# Patient Record
Sex: Female | Born: 1990
Health system: Southern US, Community
[De-identification: ages and names within clinical notes are randomized; demographics above are authoritative.]

## PROBLEM LIST (undated history)

## (undated) ENCOUNTER — Inpatient Hospital Stay (HOSPITAL_COMMUNITY): Admission: AD | Payer: Self-pay

## (undated) DIAGNOSIS — E119 Type 2 diabetes mellitus without complications: Secondary | ICD-10-CM

## (undated) DIAGNOSIS — T4145XA Adverse effect of unspecified anesthetic, initial encounter: Secondary | ICD-10-CM

## (undated) DIAGNOSIS — N840 Polyp of corpus uteri: Secondary | ICD-10-CM

## (undated) DIAGNOSIS — K219 Gastro-esophageal reflux disease without esophagitis: Secondary | ICD-10-CM

## (undated) DIAGNOSIS — Z973 Presence of spectacles and contact lenses: Secondary | ICD-10-CM

## (undated) DIAGNOSIS — T8859XA Other complications of anesthesia, initial encounter: Secondary | ICD-10-CM

## (undated) DIAGNOSIS — Z8759 Personal history of other complications of pregnancy, childbirth and the puerperium: Secondary | ICD-10-CM

## (undated) DIAGNOSIS — F32A Depression, unspecified: Secondary | ICD-10-CM

## (undated) DIAGNOSIS — R7303 Prediabetes: Secondary | ICD-10-CM

## (undated) DIAGNOSIS — D509 Iron deficiency anemia, unspecified: Secondary | ICD-10-CM

## (undated) DIAGNOSIS — F419 Anxiety disorder, unspecified: Secondary | ICD-10-CM

## (undated) DIAGNOSIS — E282 Polycystic ovarian syndrome: Secondary | ICD-10-CM

## (undated) DIAGNOSIS — O24419 Gestational diabetes mellitus in pregnancy, unspecified control: Secondary | ICD-10-CM

## (undated) DIAGNOSIS — I10 Essential (primary) hypertension: Secondary | ICD-10-CM

## (undated) DIAGNOSIS — M1990 Unspecified osteoarthritis, unspecified site: Secondary | ICD-10-CM

## (undated) DIAGNOSIS — N939 Abnormal uterine and vaginal bleeding, unspecified: Secondary | ICD-10-CM

## (undated) DIAGNOSIS — F329 Major depressive disorder, single episode, unspecified: Secondary | ICD-10-CM

## (undated) DIAGNOSIS — Z8632 Personal history of gestational diabetes: Secondary | ICD-10-CM

## (undated) HISTORY — DX: Depression, unspecified: F32.A

## (undated) HISTORY — PX: NO PAST SURGERIES: SHX2092

## (undated) HISTORY — DX: Major depressive disorder, single episode, unspecified: F32.9

## (undated) HISTORY — DX: Gestational diabetes mellitus in pregnancy, unspecified control: O24.419

## (undated) HISTORY — PX: WISDOM TOOTH EXTRACTION: SHX21

## (undated) HISTORY — DX: Anxiety disorder, unspecified: F41.9

## (undated) HISTORY — DX: Prediabetes: R73.03

## (undated) HISTORY — DX: Type 2 diabetes mellitus without complications: E11.9

## (undated) HISTORY — DX: Essential (primary) hypertension: I10

---
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
## 2015-02-28 ENCOUNTER — Encounter (HOSPITAL_COMMUNITY): Payer: Self-pay

## 2015-02-28 ENCOUNTER — Ambulatory Visit
Admission: AD | Admit: 2015-02-28 | Discharge: 2015-02-28 | Disposition: A | Discharge status: Home / Self Care | Payer: Self-pay | Attending: Obstetrics & Gynecology | Admitting: Obstetrics & Gynecology

## 2015-02-28 DIAGNOSIS — Z3A22 22 weeks gestation of pregnancy: Secondary | ICD-10-CM

## 2015-02-28 DIAGNOSIS — F410 Panic disorder [episodic paroxysmal anxiety] without agoraphobia: Secondary | ICD-10-CM

## 2015-02-28 NOTE — Discharge Instructions (Signed)
Obstetrical Outpatient Discharge Instructions:    Labor & Delivery's Phone #: [phone]                           Birth Center's Phone #: [phone]    Based on the medical evaluation completed by our staff, it has been determined that you are NOT IN NEED OF EMERGENCY OBSTETRICAL SERVICES AT THIS TIME.     If and when any of the symptoms noted below occur, you are advised to go to the hospital closest to your home that provides obstetrical services or to the hospital that you and your health care provider have agreed upon.    Please follow the instructions for:    FETAL KICK COUNTS    1. Count the baby's movement every night.  2. A movement may be a kick, swish or roll. Do not count hiccups or small flutters.  3. Count baby's movements while lying down, preferably on your left side,  preferably after a meal.  4. Mark down the time you feel the baby move for the first time.  5. Mark down the time you feel the tenth fetal movement.  6. You should feel at least 10 fetal movements within one hour.  7. Call Labor and Delivery immediately if:  a. You do not feel 10 movements within one hour  b. It takes longer and longer for your baby to move 10 times  c. You have not felt your baby move all day.        PRETERM LABOR PRECAUTIONS    1. Regular uterine tightening  2. Low, dull backache.  3. Menstrual cramps  4. Pressure in your pelvis  5. Leaking or gushing fluid from your vagina.  6. Changes in vaginal discharge; watery, bloody or mucousy.        PREGNANCY INDUCED HYPERTENSION    1. Headache: sudden or severe.  2. Visual problems: blurred or double vision or "seeing spots"  3. Pain under your ribs, right over your stomach  4. Swelling of your face and hands  5. Swelling of your ankles or feet after 12-hour rest  6. Decrease in the amount of your urine  7. Rapid weight gain: 4 or 5 pounds in one week.

## 2015-02-28 NOTE — Progress Notes (Signed)
Labor and Delivery Triage Assessment    Rachel Singh is a 25 year old at 1621w6d (Estimated Date of Delivery: 5/132017), presents to triage for SOB.    Pt was at bedside of her mother who had a heart attack. Appears to witnesses that pt was having an anxiety attack. Pt with significant h/o anxiety and depression and on no meds.     She also c/o LLQ abdominal pain which was sudden in onset. However, pt states that she     Pt denies any LOF, VB, ctx, dysuria, or pre-eclampsia symptoms including headache not relieved by tylenol, vision changes or loss of vision, RUQ pain, significant increased non-dependent edema. Reports good FM, kick counts     There is no problem list on file for this patient.      Temperature:  [98.6 F (37 C)] 98.6 F (37 C) (01/13 1700)  Blood pressure (BP): (125-130)/(61-62) 125/62 (01/13 1720)  Heart Rate:  [130] 130 (01/13 1700)  Respirations:  [30] 30 (01/13 1700)  Pain Score: 7 (01/13 1700)  O2 Device: None (Room air) (01/13 1720)  SpO2:  [100 %] 100 % (01/13 1720)    Blood Pressure   02/28/15 125/62       General A&Ox3. NAD. Anxious  Abdomen - S/NT/ND/+BS  Neurology: 2+reflexes/no clonus    SSE/SVE: deferred  Fetal Heart Tones: 160's  Toco: none    Transvaginal cervical length: pt declined TVUS  Abdominal Ultrasound: fetus breech, placenta anterior with previa, AFI: 10cm      No results found for this or any previous visit.        A/P: Rachel Singh is a 25 year old G3P1 presents for anxiety exacerbation episode - resolved spontaneously    1. Anxiety d/o-  On no meds. Discussed treatment options and need to revisit with primary OB/GYN provider. Offered SW and mental health referral but pt declines and will f/u with outside providers.  2. FWB- HR 160s. US performed and c/w dates.   3. Medical release of records requested  4. Dispo: stable    [facility]     Rachel Singh Ann Mckale Haffey, MD  Signature Derived From Controlled Access Password, February 28, 2015, 6:11 PM

## 2015-02-28 NOTE — Interdisciplinary (Signed)
Per K.Bradly BienenstockMartinez MD ok to discharge patient home.  Written and verbal d/c instructions provided to patient including Labor and PET precautions and instructed to continue fetal movement counting. Patients questions answered.  Patient given number to labor and delivery to call with any further questions, concerns, or worsening symptoms. Patient verbalized understanding of discharge instructions. Patient discharged home ambulatory. Patient to follow up in clinic on Tuesday. Phone number to clinic given to pt.

## 2015-02-28 NOTE — Interdisciplinary (Signed)
Pt to Labor and delivery from CCU where she was visiting mother. Per CCU RN pt started hyperventilating when Physician was updating family to status of mother.

## 2015-02-28 NOTE — Interdisciplinary (Signed)
Martinez MD at bedside with ultrasound to assess pt.

## 2015-02-28 NOTE — Interdisciplinary (Signed)
Baird LyonsShaylnn Chevez is a 25 year old No obstetric history on file. at Unknown presenting to labor & delivery complaining of SOB and abdominal pai    Uterine Contractions: None    Rupture of Membrane: no    Vaginal Bleeding: no    Previous C/S: no    Pre-eclampsia symptoms: no    Urinary tract infection symptoms: no      External fetal monitor and toco applied. VS obtained. Side rails up x2. Call light in reach. Patient awaiting evaluation by provider.    Rhodia AlbrightSilvia Lamiyah Schlotter, RN,

## 2016-01-19 ENCOUNTER — Ambulatory Visit (HOSPITAL_COMMUNITY)
Admission: EM | Admit: 2016-01-19 | Discharge: 2016-01-19 | Disposition: A | Discharge status: Home / Self Care | Payer: Self-pay | Attending: Family Medicine | Admitting: Family Medicine

## 2016-01-19 ENCOUNTER — Ambulatory Visit (INDEPENDENT_AMBULATORY_CARE_PROVIDER_SITE_OTHER): Payer: Self-pay

## 2016-01-19 ENCOUNTER — Encounter (HOSPITAL_COMMUNITY): Payer: Self-pay | Admitting: *Deleted

## 2016-01-19 DIAGNOSIS — J189 Pneumonia, unspecified organism: Secondary | ICD-10-CM

## 2016-01-19 DIAGNOSIS — J181 Lobar pneumonia, unspecified organism: Secondary | ICD-10-CM

## 2016-01-19 LAB — POCT PREGNANCY, URINE: PREG TEST UR: NEGATIVE

## 2016-01-19 MED ORDER — HYDROCODONE-HOMATROPINE 5-1.5 MG/5ML PO SYRP: 5.0000 mL | ORAL_SOLUTION | Freq: Four times a day (QID) | ORAL | 0 refills | Status: DC | PRN

## 2016-01-19 MED ORDER — AZITHROMYCIN 250 MG PO TABS: 250.0000 mg | ORAL_TABLET | Freq: Every day | ORAL | 0 refills | Status: DC

## 2016-01-19 NOTE — ED Triage Notes (Signed)
Pt  Reports  Symptoms  Of  Cough   Congestion  Body  Aches    With   Symptoms   Of  Earache  As  Well

## 2016-01-19 NOTE — ED Provider Notes (Signed)
[facility]    CSN: [id number] Arrival date & time: 01/19/16  1326     History   Chief Complaint No chief complaint on file.   HPI Chloe Cervantes is a 25 y.o. female.   This 25 year old woman who presents with cold symptoms. She's had these symptoms for 4 days. Although she hasn't taken her temperature, she says that she's felt hot every day.  The cough is painful and she has discomfort in her right chest. She's not having nausea or vomiting but she has had diffuse myalgia.      History reviewed. No pertinent past medical history.  There are no active problems to display for this patient.   History reviewed. No pertinent surgical history.  OB History    No data available       Home Medications    Prior to Admission medications   Medication Sig Start Date End Date Taking? Authorizing Provider  azithromycin (ZITHROMAX) 250 MG tablet Take 1 tablet (250 mg total) by mouth daily. Take first 2 tablets together, then 1 every day until finished. 01/19/16   Elvina SidleKurt Miles Leyda, MD  HYDROcodone-homatropine [facility](HYCODAN) 5-1.5 MG/5ML syrup Take 5 mLs by mouth every 6 (six) hours as needed for cough. 01/19/16   Elvina SidleKurt Tyneshia Stivers, MD    Family History No family history on file.  Social History Social History  Substance Use Topics  . Smoking status: Current Some Day Smoker  . Smokeless tobacco: Not on file  . Alcohol use No     Allergies   Patient has no known allergies.   Review of Systems Review of Systems  Constitutional: Positive for chills, diaphoresis and fatigue.  HENT: Positive for congestion and postnasal drip.   Respiratory: Positive for cough.   Cardiovascular: Positive for chest pain.  Gastrointestinal: Negative.   Genitourinary: Negative.   Neurological: Negative.      Physical Exam Triage Vital Signs ED Triage Vitals  Enc Vitals Group     BP      Pulse      Resp      Temp      Temp src      SpO2      Weight      Height      Head  Circumference      Peak Flow      Pain Score      Pain Loc      Pain Edu?      Excl. in GC?    No data found.   Updated Vital Signs BP 122/85 (BP Location: Right Arm)   Pulse 101   Temp 100.4 F (38 C) (Oral)   Resp 16   SpO2 98%    Physical Exam  Constitutional: She is oriented to person, place, and time. She appears well-developed and well-nourished.  HENT:  Head: Normocephalic.  Right Ear: External ear normal.  Left Ear: External ear normal.  Mouth/Throat: Oropharynx is clear and moist.  Eyes: Conjunctivae and EOM are normal.  Neck: Normal range of motion. Neck supple.  Cardiovascular: Normal rate, regular rhythm and normal heart sounds.   Pulmonary/Chest: Effort normal and breath sounds normal.  Musculoskeletal: Normal range of motion.  Lymphadenopathy:    She has no cervical adenopathy.  Neurological: She is alert and oriented to person, place, and time.  Skin: Skin is warm and dry.  Nursing note and vitals reviewed.    UC Treatments / Results  Labs (all labs ordered are listed, but only abnormal results  are displayed) Labs Reviewed  POCT PREGNANCY, URINE    EKG  EKG Interpretation None      Radiology Dg Chest 2 View  Result Date: 01/19/2016 CLINICAL DATA:  Cough for 4 days.  Chest pain.  Shortness of breath. EXAM: CHEST  2 VIEW COMPARISON:  None. FINDINGS: There is hazy right lower lobe airspace disease concerning for pneumonia. There is no pleural effusion or pneumothorax. The heart and mediastinal contours are unremarkable. The osseous structures are unremarkable. IMPRESSION: Hazy right lower lobe airspace disease concerning for pneumonia. Electronically Signed   By: Elige KoHetal  Patel   On: 01/19/2016 14:25    Procedure Procedures (including critical care time)  Medications Ordered in UC Medications - No data to display   Initial Impression / Assessment and Plan / UC Course  I have reviewed the triage vital signs and the nursing notes.  Pertinent  labs & imaging results that were available during my care of the patient were reviewed by me and considered in my medical decision making (see chart for details).  Clinical Course     Final Clinical Impressions(s) / UC Diagnoses   Final diagnoses:  Community acquired pneumonia of right lower lobe of lung (HCC)    New Prescriptions New Prescriptions   AZITHROMYCIN (ZITHROMAX) 250 MG TABLET    Take 1 tablet (250 mg total) by mouth daily. Take first 2 tablets together, then 1 every day until finished.   HYDROCODONE-HOMATROPINE (HYCODAN) 5-1.5 MG/5ML SYRUP    Take 5 mLs by mouth every 6 (six) hours as needed for cough.     Elvina SidleKurt Dameon Soltis, MD 01/19/16 [phone]

## 2016-03-27 ENCOUNTER — Emergency Department (HOSPITAL_COMMUNITY)
Admission: EM | Admit: 2016-03-27 | Discharge: 2016-03-27 | Disposition: A | Discharge status: Home / Self Care | Payer: Self-pay | Attending: Emergency Medicine | Admitting: Emergency Medicine

## 2016-03-27 ENCOUNTER — Encounter (HOSPITAL_COMMUNITY): Payer: Self-pay | Admitting: Vascular Surgery

## 2016-03-27 DIAGNOSIS — N611 Abscess of the breast and nipple: Secondary | ICD-10-CM

## 2016-03-27 DIAGNOSIS — Z3202 Encounter for pregnancy test, result negative: Secondary | ICD-10-CM | POA: Insufficient documentation

## 2016-03-27 DIAGNOSIS — F17200 Nicotine dependence, unspecified, uncomplicated: Secondary | ICD-10-CM | POA: Insufficient documentation

## 2016-03-27 LAB — POC URINE PREG, ED: PREG TEST UR: NEGATIVE

## 2016-03-27 MED ORDER — NAPROXEN 500 MG PO TABS: 500.0000 mg | ORAL_TABLET | Freq: Two times a day (BID) | ORAL | 0 refills | Status: DC

## 2016-03-27 MED ORDER — HYDROCODONE-ACETAMINOPHEN 5-325 MG PO TABS
1.0000 | ORAL_TABLET | Freq: Once | ORAL | Status: AC
  Administered 2016-03-27: 1 via ORAL
  Filled 2016-03-27: qty 1

## 2016-03-27 MED ORDER — CEPHALEXIN 250 MG PO CAPS
500.0000 mg | ORAL_CAPSULE | Freq: Once | ORAL | Status: AC
  Administered 2016-03-27: 500 mg via ORAL
  Filled 2016-03-27: qty 2

## 2016-03-27 MED ORDER — CEPHALEXIN 500 MG PO CAPS: 500.0000 mg | ORAL_CAPSULE | Freq: Four times a day (QID) | ORAL | 0 refills | Status: DC

## 2016-03-27 MED ORDER — SULFAMETHOXAZOLE-TRIMETHOPRIM 800-160 MG PO TABS
1.0000 | ORAL_TABLET | Freq: Once | ORAL | Status: AC
  Administered 2016-03-27: 1 via ORAL
  Filled 2016-03-27: qty 1

## 2016-03-27 MED ORDER — SULFAMETHOXAZOLE-TRIMETHOPRIM 800-160 MG PO TABS: 1.0000 | ORAL_TABLET | Freq: Two times a day (BID) | ORAL | 0 refills | Status: AC

## 2016-03-27 NOTE — ED Triage Notes (Signed)
Pt reports to the ED for eval of erythematous, tender, hard lump noted to her right breast. It developed on Wednesday and has been getting progressively worse. Denies any fevers. Pt has not tried home treatments.

## 2016-03-27 NOTE — ED Provider Notes (Signed)
[facility] Provider Note    By signing my name below, I, Earmon PhoenixJennifer Waddell, attest that this documentation has been prepared under the direction and in the presence of [facility] Neese, [place]. Electronically Signed: Earmon PhoenixJennifer Waddell, ED Scribe. 03/27/16. 11:16 PM.    History   Chief Complaint Chief Complaint  Patient presents with  . Abscess   The history is provided by the patient and medical records. No language interpreter was used.    Chloe Cervantes is an obese 26 y.o. female who presents to the Emergency Department complaining of new onset, worsening abscess to the right breast that appeared three days ago. She reports associated chills, nausea and worsening pain and redness. She has not done anything to treat the area. Touching the area or raising her RUE increases her pain. She denies alleviating factors. She denies nipple drainage, fever, abdominal pain, vomiting or drainage from the area. She does not believe she is pregnant but is not certain and denies currently breast feeding.   History reviewed. No pertinent past medical history.  There are no active problems to display for this patient.   History reviewed. No pertinent surgical history.  OB History    No data available       Home Medications    Prior to Admission medications   Medication Sig Start Date End Date Taking? Authorizing Provider  azithromycin (ZITHROMAX) 250 MG tablet Take 1 tablet (250 mg total) by mouth daily. Take first 2 tablets together, then 1 every day until finished. 01/19/16   Elvina SidleKurt Lauenstein, MD  cephALEXin (KEFLEX) 500 MG capsule Take 1 capsule (500 mg total) by mouth 4 (four) times daily. 03/27/16   Hope Orlene OchM Neese, NP  HYDROcodone-homatropine (HYCODAN) 5-1.5 MG/5ML syrup Take 5 mLs by mouth every 6 (six) hours as needed for cough. 01/19/16   Elvina SidleKurt Lauenstein, MD  naproxen (NAPROSYN) 500 MG tablet Take 1 tablet (500 mg total) by mouth 2 (two) times daily. 03/27/16   Hope Orlene OchM Neese, NP    sulfamethoxazole-trimethoprim (BACTRIM DS,SEPTRA DS) 800-160 MG tablet Take 1 tablet by mouth 2 (two) times daily. 03/27/16 04/03/16  Hope Orlene OchM Neese, NP    Family History History reviewed. No pertinent family history.  Social History Social History  Substance Use Topics  . Smoking status: Current Some Day Smoker  . Smokeless tobacco: Never Used  . Alcohol use No     Allergies   Patient has no known allergies.   Review of Systems Review of Systems  Constitutional: Negative for chills and fever.  Gastrointestinal: Negative for abdominal pain, nausea and vomiting.  Musculoskeletal: Positive for myalgias. Negative for neck pain.  Skin: Positive for color change. Negative for rash.       Abscess to right breast  Neurological: Negative for headaches.  Psychiatric/Behavioral: Negative for confusion.     Physical Exam Updated Vital Signs BP 133/78 (BP Location: Right Arm)   Pulse 94   Temp 99.1 F (37.3 C) (Oral)   Resp 18   SpO2 100%   Physical Exam  Constitutional: She appears well-developed and well-nourished. No distress.  HENT:  Head: Normocephalic.  Eyes: EOM are normal.  Neck: Neck supple.  Cardiovascular: Normal rate.   Pulmonary/Chest: Effort normal. Right breast exhibits skin change and tenderness. Right breast exhibits no nipple discharge. There is breast swelling.    5 cm raised, firm, tender area with erythema to the right breast @ 2 o'clock  Musculoskeletal: Normal range of motion.  Lymphadenopathy:    She has no axillary  adenopathy.  No axillary nodes palpable.  Neurological: She is alert.  Skin: Skin is warm and dry. There is erythema.  5 cm raised area of erythema, increased warmth and firmness of right breast at 2 o'clock.  Psychiatric: She has a normal mood and affect. Her behavior is normal.  Nursing note and vitals reviewed.    ED Treatments / Results  DIAGNOSTIC STUDIES: Oxygen Saturation is 100% on RA, normal by my interpretation.    COORDINATION OF CARE: 10:21 PM- Will speak with Dr. Clarene Duke about appropriate plan of care.will have patient f[facility] for [facility] and possible I&D of the area.  Pt verbalizes understanding and agrees to plan.  10:24 PM- Will check urine pregnancy before deciding which antibiotic to prescribe to patient.  11:08 PM- Urine pregnancy test negative. Will discharge home with prescription for Keflex and Bactrim. Return precautions discussed. Information regarding [facility] given to the patient. She will call for appointment. If you has difficulty getting an appointment the Case Manager will help her.    Medications  sulfamethoxazole-trimethoprim (BACTRIM DS,SEPTRA DS) 800-160 MG per tablet 1 tablet (not administered)  cephALEXin (KEFLEX) capsule 500 mg (not administered)  HYDROcodone-acetaminophen (NORCO/VICODIN) 5-325 MG per tablet 1 tablet (not administered)    Labs (all labs ordered are listed, but only abnormal results are displayed) Labs Reviewed  POC URINE PREG, ED   Radiology No results found.  Procedures Procedures (including critical care time)  Medications Ordered in ED Medications  sulfamethoxazole-trimethoprim (BACTRIM DS,SEPTRA DS) 800-160 MG per tablet 1 tablet (not administered)  cephALEXin (KEFLEX) capsule 500 mg (not administered)  HYDROcodone-acetaminophen (NORCO/VICODIN) 5-325 MG per tablet 1 tablet (not administered)     Initial Impression / Assessment and Plan / ED Course  I have reviewed the triage vital signs and the nursing notes.    Patient with skin abscess to the right breast. Incision and drainage not performed in the ED today due to presentation of area and firmness. Supportive care and return precautions discussed. Spoke with Dr. Clarene Duke about plan of care and she is in agreement. Pt sent home with Bactrim and Keflex. The patient appears reasonably screened and/or stabilized for discharge and I doubt any other emergent medical  condition requiring further screening, evaluation, or treatment in the ED prior to discharge. I personally performed the services described in this documentation, which was scribed in my presence. The recorded information has been reviewed and is accurate.   Final Clinical Impressions(s) / ED Diagnoses   Final diagnoses:  Abscess of breast, right    New Prescriptions New Prescriptions   CEPHALEXIN (KEFLEX) 500 MG CAPSULE    Take 1 capsule (500 mg total) by mouth 4 (four) times daily.   NAPROXEN (NAPROSYN) 500 MG TABLET    Take 1 tablet (500 mg total) by mouth 2 (two) times daily.   SULFAMETHOXAZOLE-TRIMETHOPRIM (BACTRIM DS,SEPTRA DS) 800-160 MG TABLET    Take 1 tablet by mouth 2 (two) times daily.     [place], NP 03/30/16 0240    Laurence Spates, MD 03/31/16 1226

## 2016-03-27 NOTE — ED Notes (Signed)
See PA assessment 

## 2016-03-27 NOTE — Discharge Instructions (Signed)
We are starting antibiotics and pain medication for your breast abscess. You will need to call [facility] on Monday morning and tell them you were seen her and need to be evaluated for the abscess. If you develop high fever red streaking, increased pain or other problems, return here.   If you have problems getting an appointment with the breast center call back to the ED and someone will help with arranging the appointment.

## 2016-03-27 NOTE — ED Notes (Signed)
Pt stable, ambulatory, states understanding of discharge instructions 

## 2016-04-24 ENCOUNTER — Encounter (HOSPITAL_COMMUNITY): Payer: Self-pay

## 2016-11-12 ENCOUNTER — Ambulatory Visit: Payer: Self-pay | Admitting: Family Medicine

## 2016-11-18 ENCOUNTER — Emergency Department (HOSPITAL_COMMUNITY): Payer: Medicaid Other

## 2016-11-18 ENCOUNTER — Emergency Department (HOSPITAL_COMMUNITY)
Admission: EM | Admit: 2016-11-18 | Discharge: 2016-11-18 | Disposition: A | Discharge status: Home / Self Care | Payer: Medicaid Other | Attending: Emergency Medicine | Admitting: Emergency Medicine

## 2016-11-18 ENCOUNTER — Encounter (HOSPITAL_COMMUNITY): Payer: Self-pay

## 2016-11-18 DIAGNOSIS — R102 Pelvic and perineal pain: Secondary | ICD-10-CM

## 2016-11-18 DIAGNOSIS — F17200 Nicotine dependence, unspecified, uncomplicated: Secondary | ICD-10-CM | POA: Insufficient documentation

## 2016-11-18 DIAGNOSIS — N83202 Unspecified ovarian cyst, left side: Secondary | ICD-10-CM | POA: Insufficient documentation

## 2016-11-18 DIAGNOSIS — Z79899 Other long term (current) drug therapy: Secondary | ICD-10-CM | POA: Insufficient documentation

## 2016-11-18 DIAGNOSIS — N83201 Unspecified ovarian cyst, right side: Secondary | ICD-10-CM | POA: Diagnosis not present

## 2016-11-18 LAB — WET PREP, GENITAL
Clue Cells Wet Prep HPF POC: NONE SEEN
Sperm: NONE SEEN
TRICH WET PREP: NONE SEEN
YEAST WET PREP: NONE SEEN

## 2016-11-18 LAB — URINALYSIS, ROUTINE W REFLEX MICROSCOPIC
Bilirubin Urine: NEGATIVE
Glucose, UA: NEGATIVE mg/dL
Hgb urine dipstick: NEGATIVE
KETONES UR: NEGATIVE mg/dL
LEUKOCYTES UA: NEGATIVE
NITRITE: NEGATIVE
PROTEIN: NEGATIVE mg/dL
Specific Gravity, Urine: 1.025 (ref 1.005–1.030)
pH: 7 (ref 5.0–8.0)

## 2016-11-18 LAB — COMPREHENSIVE METABOLIC PANEL
ALBUMIN: 3.6 g/dL (ref 3.5–5.0)
ALT: 25 U/L (ref 14–54)
AST: 22 U/L (ref 15–41)
Alkaline Phosphatase: 63 U/L (ref 38–126)
Anion gap: 7 (ref 5–15)
BILIRUBIN TOTAL: 0.4 mg/dL (ref 0.3–1.2)
BUN: 12 mg/dL (ref 6–20)
CO2: 26 mmol/L (ref 22–32)
CREATININE: 0.6 mg/dL (ref 0.44–1.00)
Calcium: 8.5 mg/dL — ABNORMAL LOW (ref 8.9–10.3)
Chloride: 104 mmol/L (ref 101–111)
Glucose, Bld: 98 mg/dL (ref 65–99)
POTASSIUM: 3.8 mmol/L (ref 3.5–5.1)
Sodium: 137 mmol/L (ref 135–145)
TOTAL PROTEIN: 6.4 g/dL — AB (ref 6.5–8.1)

## 2016-11-18 LAB — CBC
HCT: 36.7 % (ref 36.0–46.0)
Hemoglobin: 11.6 g/dL — ABNORMAL LOW (ref 12.0–15.0)
MCH: 25.7 pg — ABNORMAL LOW (ref 26.0–34.0)
MCHC: 31.6 g/dL (ref 30.0–36.0)
MCV: 81.4 fL (ref 78.0–100.0)
PLATELETS: 394 10*3/uL (ref 150–400)
RBC: 4.51 MIL/uL (ref 3.87–5.11)
RDW: 13.1 % (ref 11.5–15.5)
WBC: 8.1 10*3/uL (ref 4.0–10.5)

## 2016-11-18 LAB — I-STAT BETA HCG BLOOD, ED (MC, WL, AP ONLY)

## 2016-11-18 LAB — LIPASE, BLOOD: Lipase: 27 U/L (ref 11–51)

## 2016-11-18 MED ORDER — MORPHINE SULFATE (PF) 4 MG/ML IV SOLN: 4.0000 mg | Freq: Once | INTRAVENOUS | Status: DC

## 2016-11-18 MED ORDER — AZITHROMYCIN 250 MG PO TABS
1000.0000 mg | ORAL_TABLET | Freq: Once | ORAL | Status: AC
  Administered 2016-11-18: 1000 mg via ORAL
  Filled 2016-11-18: qty 4

## 2016-11-18 MED ORDER — LIDOCAINE HCL (PF) 1 % IJ SOLN
INTRAMUSCULAR | Status: AC
  Administered 2016-11-18: 0.9 mL
  Filled 2016-11-18: qty 5

## 2016-11-18 MED ORDER — IBUPROFEN 800 MG PO TABS
800.0000 mg | ORAL_TABLET | Freq: Once | ORAL | Status: AC
  Administered 2016-11-18: 800 mg via ORAL
  Filled 2016-11-18: qty 1

## 2016-11-18 MED ORDER — OXYCODONE-ACETAMINOPHEN 5-325 MG PO TABS
1.0000 | ORAL_TABLET | Freq: Once | ORAL | Status: AC
  Administered 2016-11-18: 1 via ORAL
  Filled 2016-11-18: qty 1

## 2016-11-18 MED ORDER — KETOROLAC TROMETHAMINE 30 MG/ML IJ SOLN
30.0000 mg | Freq: Once | INTRAMUSCULAR | Status: DC
  Filled 2016-11-18: qty 1

## 2016-11-18 MED ORDER — CEFTRIAXONE SODIUM 250 MG IJ SOLR
250.0000 mg | Freq: Once | INTRAMUSCULAR | Status: AC
  Administered 2016-11-18: 250 mg via INTRAMUSCULAR
  Filled 2016-11-18: qty 250

## 2016-11-18 MED ORDER — IBUPROFEN 800 MG PO TABS: 800.0000 mg | ORAL_TABLET | Freq: Three times a day (TID) | ORAL | 0 refills | Status: DC

## 2016-11-18 NOTE — ED Triage Notes (Signed)
Per Pt, Pt reports going to class yesterday and having some sharp abdominal pain that has been intermittent since then. Reports some nausea, but denies diarrhea or vomiting. Last period was four months ago, pt reports four negative pregnancy tests.

## 2016-11-18 NOTE — ED Provider Notes (Signed)
[facility]-EMERGENCY DEPT Provider Note   CSN: [id number] Arrival date & time: 11/18/16  6578     History   Chief Complaint Chief Complaint  Patient presents with  . Abdominal Pain    HPI Chloe Cervantes is a 26 y.o. female.  HPI   26 year old female presenting for evaluation of abdominal pain. Patient report acute onset of sharp pain that started in the mid abdomen. Yesterday and has now residing her low abdomen. She described pain as a sharp sensation, moderate in severity and rated as 7 out of 10, worse with walking and with movement but presents with rest. Reported history of ovarian cyst. Did report decreased appetite for the past several days but denies postprandial pain. No report of nausea vomiting or diarrhea dysuria or hematuria vaginal bleeding or vaginal discharge. No evidence of fever chills, chest pain short of breath, productive cough. Her last visit. Was 07/19/2016. Denies any new sexual partners. Denies any recent strenuous activities or heavy lifting. No specific treatment tried.  History reviewed. No pertinent past medical history.  There are no active problems to display for this patient.   History reviewed. No pertinent surgical history.  OB History    No data available       Home Medications    Prior to Admission medications   Medication Sig Start Date End Date Taking? Authorizing Provider  azithromycin (ZITHROMAX) 250 MG tablet Take 1 tablet (250 mg total) by mouth daily. Take first 2 tablets together, then 1 every day until finished. 01/19/16   Elvina Sidle, MD  cephALEXin (KEFLEX) 500 MG capsule Take 1 capsule (500 mg total) by mouth 4 (four) times daily. 03/27/16   Janne Napoleon, NP  HYDROcodone-homatropine [facility]) 5-1.5 MG/5ML syrup Take 5 mLs by mouth every 6 (six) hours as needed for cough. 01/19/16   Elvina Sidle, MD  naproxen (NAPROSYN) 500 MG tablet Take 1 tablet (500 mg total) by mouth 2 (two) times daily. 03/27/16   Janne Napoleon, NP     Family History No family history on file.  Social History Social History  Substance Use Topics  . Smoking status: Current Some Day Smoker  . Smokeless tobacco: Never Used  . Alcohol use No     Allergies   Patient has no known allergies.   Review of Systems Review of Systems  All other systems reviewed and are negative.    Physical Exam Updated Vital Signs BP 121/76 (BP Location: Left Arm)   Pulse 64   Temp 97.7 F (36.5 C) (Oral)   Resp 16   Ht  (1.549 m)   Wt 97.5 kg (215 lb)   LMP 07/19/2016 (Within Weeks)   SpO2 100%   BMI 40.62 kg/m   Physical Exam  Constitutional: She appears well-developed and well-nourished. No distress.  HENT:  Head: Atraumatic.  Eyes: Conjunctivae are normal.  Neck: Neck supple.  Cardiovascular: Normal rate and regular rhythm.   Pulmonary/Chest: Effort normal and breath sounds normal.  Abdominal: Soft. She exhibits no distension. There is tenderness (Tenderness to periumbilical region as well as low abdomen on palpation without guarding or rebound tenderness. Negative Murphy sign, no pain at McBurney's point.).  Genitourinary:  Genitourinary Comments: Please refer to Procedural section  Neurological: She is alert.  Skin: No rash noted.  Psychiatric: She has a normal mood and affect.  Nursing note and vitals reviewed.    ED Treatments / Results  Labs (all labs ordered are listed, but only abnormal results are displayed) Labs  Reviewed  WET PREP, GENITAL - Abnormal; Notable for the following:       Result Value   WBC, Wet Prep HPF POC MANY (*)    All other components within normal limits  COMPREHENSIVE METABOLIC PANEL - Abnormal; Notable for the following:    Calcium 8.5 (*)    Total Protein 6.4 (*)    All other components within normal limits  CBC - Abnormal; Notable for the following:    Hemoglobin 11.6 (*)    MCH 25.7 (*)    All other components within normal limits  LIPASE, BLOOD  URINALYSIS, ROUTINE W REFLEX  MICROSCOPIC  RPR  HIV ANTIBODY (ROUTINE TESTING)  I-STAT BETA HCG BLOOD, ED ([facility], [facility], [facility] ONLY)  GC/CHLAMYDIA PROBE AMP ([place]) NOT AT [facility]    EKG  EKG Interpretation None       Radiology US Transvaginal Non-ob  Result Date: 11/18/2016 CLINICAL DATA:  Left lower quadrant pain for 1 day. EXAM: TRANSABDOMINAL AND TRANSVAGINAL ULTRASOUND OF PELVIS DOPPLER ULTRASOUND OF OVARIES TECHNIQUE: Both transabdominal and transvaginal ultrasound examinations of the pelvis were performed. Transabdominal technique was performed for global imaging of the pelvis including uterus, ovaries, adnexal regions, and pelvic cul-de-sac. It was necessary to proceed with endovaginal exam following the transabdominal exam to visualize the endometrium and ovaries to better advantage. Color and duplex Doppler ultrasound was utilized to evaluate blood flow to the ovaries. COMPARISON:  None. FINDINGS: Uterus Measurements: 8.0 x 3.9 x 5.0 cm. No fibroids or other mass visualized. Endometrium Thickness: 12 mm.  No focal abnormality visualized. Right ovary Measurements: 4.0 x 2.3 x 2.4 cm. Multiple small follicular cysts are noted along the ovarian periphery. Consider polycystic ovarian syndrome in the proper clinical setting. Ovary otherwise unremarkable. No adnexal masses. Left ovary Measurements: 2.8 x 2.5 x 2.5 cm. Multiple small follicular cysts are noted along the ovarian periphery. Consider polycystic ovarian syndrome in the proper clinical setting. Ovary otherwise unremarkable. There are 2 paraovarian simple appearing cyst adjacent to the left ovary, largest measuring 4.1 x 3.9 x 3.9 cm and the smaller measuring 2.6 x 2.2 x 2.5 cm. No other adnexal abnormalities. Pulsed Doppler evaluation of both ovaries demonstrates normal low-resistance arterial and venous waveforms. Other findings No abnormal free fluid. IMPRESSION: 1. There are 2 adjacent left para ovarian cysts, both simple in appearance, largest measuring 4.1 cm.  These are likely incidental findings. 2. Ovaries show small peripherally arranged cysts that raise the possibility of polycystic ovarian syndrome. Ovaries otherwise unremarkable with no evidence of torsion. 3. No other abnormalities. Electronically Signed   By: Amie Portland M.D.   On: 11/18/2016 15:32   US Pelvis Complete  Result Date: 11/18/2016 CLINICAL DATA:  Left lower quadrant pain for 1 day. EXAM: TRANSABDOMINAL AND TRANSVAGINAL ULTRASOUND OF PELVIS DOPPLER ULTRASOUND OF OVARIES TECHNIQUE: Both transabdominal and transvaginal ultrasound examinations of the pelvis were performed. Transabdominal technique was performed for global imaging of the pelvis including uterus, ovaries, adnexal regions, and pelvic cul-de-sac. It was necessary to proceed with endovaginal exam following the transabdominal exam to visualize the endometrium and ovaries to better advantage. Color and duplex Doppler ultrasound was utilized to evaluate blood flow to the ovaries. COMPARISON:  None. FINDINGS: Uterus Measurements: 8.0 x 3.9 x 5.0 cm. No fibroids or other mass visualized. Endometrium Thickness: 12 mm.  No focal abnormality visualized. Right ovary Measurements: 4.0 x 2.3 x 2.4 cm. Multiple small follicular cysts are noted along the ovarian periphery. Consider polycystic ovarian syndrome in the proper  clinical setting. Ovary otherwise unremarkable. No adnexal masses. Left ovary Measurements: 2.8 x 2.5 x 2.5 cm. Multiple small follicular cysts are noted along the ovarian periphery. Consider polycystic ovarian syndrome in the proper clinical setting. Ovary otherwise unremarkable. There are 2 paraovarian simple appearing cyst adjacent to the left ovary, largest measuring 4.1 x 3.9 x 3.9 cm and the smaller measuring 2.6 x 2.2 x 2.5 cm. No other adnexal abnormalities. Pulsed Doppler evaluation of both ovaries demonstrates normal low-resistance arterial and venous waveforms. Other findings No abnormal free fluid. IMPRESSION: 1. There  are 2 adjacent left para ovarian cysts, both simple in appearance, largest measuring 4.1 cm. These are likely incidental findings. 2. Ovaries show small peripherally arranged cysts that raise the possibility of polycystic ovarian syndrome. Ovaries otherwise unremarkable with no evidence of torsion. 3. No other abnormalities. Electronically Signed   By: Amie Portland M.D.   On: 11/18/2016 15:32   [place] Art/ven Flow Abd Pelv Doppler  Result Date: 11/18/2016 CLINICAL DATA:  Left lower quadrant pain for 1 day. EXAM: TRANSABDOMINAL AND TRANSVAGINAL ULTRASOUND OF PELVIS DOPPLER ULTRASOUND OF OVARIES TECHNIQUE: Both transabdominal and transvaginal ultrasound examinations of the pelvis were performed. Transabdominal technique was performed for global imaging of the pelvis including uterus, ovaries, adnexal regions, and pelvic cul-de-sac. It was necessary to proceed with endovaginal exam following the transabdominal exam to visualize the endometrium and ovaries to better advantage. Color and duplex Doppler ultrasound was utilized to evaluate blood flow to the ovaries. COMPARISON:  None. FINDINGS: Uterus Measurements: 8.0 x 3.9 x 5.0 cm. No fibroids or other mass visualized. Endometrium Thickness: 12 mm.  No focal abnormality visualized. Right ovary Measurements: 4.0 x 2.3 x 2.4 cm. Multiple small follicular cysts are noted along the ovarian periphery. Consider polycystic ovarian syndrome in the proper clinical setting. Ovary otherwise unremarkable. No adnexal masses. Left ovary Measurements: 2.8 x 2.5 x 2.5 cm. Multiple small follicular cysts are noted along the ovarian periphery. Consider polycystic ovarian syndrome in the proper clinical setting. Ovary otherwise unremarkable. There are 2 paraovarian simple appearing cyst adjacent to the left ovary, largest measuring 4.1 x 3.9 x 3.9 cm and the smaller measuring 2.6 x 2.2 x 2.5 cm. No other adnexal abnormalities. Pulsed Doppler evaluation of both ovaries demonstrates normal  low-resistance arterial and venous waveforms. Other findings No abnormal free fluid. IMPRESSION: 1. There are 2 adjacent left para ovarian cysts, both simple in appearance, largest measuring 4.1 cm. These are likely incidental findings. 2. Ovaries show small peripherally arranged cysts that raise the possibility of polycystic ovarian syndrome. Ovaries otherwise unremarkable with no evidence of torsion. 3. No other abnormalities. Electronically Signed   By: Amie Portland M.D.   On: 11/18/2016 15:32    Procedures Pelvic exam Date/Time: 11/18/2016 1:24 PM Performed by: Fayrene Helper Authorized by: Fayrene Helper  Consent given by: patient Patient identity confirmed: verbally with patient Comments: Pelvic exam performed with permission of pt and female ED tech assist during exam.  External genitalia w/out lesions.  Vaginal vault with normal functional discharge.  Cervix w/out lesions, not friable, GC/Chlamydia and wet prep obtained and sent to lab.  Bimanual exam w adnexal tenderness bilateraly and CMT.     (including critical care time)  Medications Ordered in ED Medications  ibuprofen (ADVIL,MOTRIN) tablet 800 mg (800 mg Oral Given 11/18/16 1408)  cefTRIAXone (ROCEPHIN) injection 250 mg (250 mg Intramuscular Given 11/18/16 1531)  azithromycin (ZITHROMAX) tablet 1,000 mg (1,000 mg Oral Given 11/18/16 1527)  oxyCODONE-acetaminophen (PERCOCET/ROXICET) 5-325 MG  per tablet 1 tablet (1 tablet Oral Given 11/18/16 1527)  lidocaine (PF) (XYLOCAINE) 1 % injection (0.9 mLs  Given 11/18/16 1531)     Initial Impression / Assessment and Plan / ED Course  I have reviewed the triage vital signs and the nursing notes.  Pertinent labs & imaging results that were available during my care of the patient were reviewed by me and considered in my medical decision making (see chart for details).     BP 106/75   Pulse 84   Temp 97.7 F (36.5 C) (Oral)   Resp 16   Ht  (1.549 m)   Wt 97.5 kg (215 lb)   LMP  07/19/2016 (Within Weeks)   SpO2 98%   BMI 40.62 kg/m    Final Clinical Impressions(s) / ED Diagnoses   Final diagnoses:  Pelvic pain in female  Cysts of both ovaries    New Prescriptions New Prescriptions   IBUPROFEN (ADVIL,MOTRIN) 800 MG TABLET    Take 1 tablet (800 mg total) by mouth 3 (three) times daily.   12:33 PM Patient with history of ovarian cyst here with low abdominal pain is reproducible on exam, left lower abdomen greater than right. This symptom is less likely to be biliary disease, or appendicitis. Will perform pelvic examination for further evaluation.  1:26 PM Pt report having tenderness to lower abd.  On pelvic examination pt has bilateral adnexal tenderness and CMT.  Given her pain, will give rocephin/zithromax for STI prophylaxis and will also obtain pelvic US to r/o ovarian torsion or TOA.    4:03 PM Pelvic ultrasound shown evidence of multiple ovarian cysts concerning for polycystic ovarian syndrome. Patient was made aware of this. I suspect pain may be due to a possible ruptured ovarian cyst. No significant relief fluid noted. No evidence of ovarian torsion or tubo-ovarian abscess. Patient discharged home with symptomatic treatment, return caution discussed. Coronary possible PID however patient felt strongly that she does not have any STI. Will await GC and Chlamydia culture. If she tests positive for either gonorrhea or chlamydia, please consider treating for PID with doxycycline for 10 days.   Fayrene Helper, PA-C 11/18/16 1604    Cathren Laine, MD 11/19/16 [phone]

## 2016-11-18 NOTE — Discharge Instructions (Signed)
Your pain is likely due to ruptured ovarian cyst.  Take ibuprofen as needed and rest.  Pain will usually last 3-5 days.  Return if you develop fever, pain worse with eating, persistent nausea and vomiting.  You will also be notified if you are having any specific bacterial infection that will require further treatment.  Avoid sexual activities until your symptoms completely resolved.

## 2016-11-19 LAB — GC/CHLAMYDIA PROBE AMP (~~LOC~~) NOT AT ARMC
CHLAMYDIA, DNA PROBE: NEGATIVE
NEISSERIA GONORRHEA: NEGATIVE

## 2016-11-20 LAB — HIV ANTIBODY (ROUTINE TESTING W REFLEX): HIV SCREEN 4TH GENERATION: NONREACTIVE

## 2016-11-20 LAB — RPR: RPR: NONREACTIVE

## 2016-12-31 ENCOUNTER — Encounter: Payer: Self-pay | Admitting: Nurse Practitioner

## 2016-12-31 ENCOUNTER — Ambulatory Visit: Payer: Medicaid Other | Attending: Nurse Practitioner | Admitting: Nurse Practitioner

## 2016-12-31 VITALS — BP 132/79 | HR 86 | Temp 98.8°F | Resp 18 | Ht 62.0 in | Wt 215.0 lb

## 2016-12-31 DIAGNOSIS — F17200 Nicotine dependence, unspecified, uncomplicated: Secondary | ICD-10-CM | POA: Diagnosis not present

## 2016-12-31 DIAGNOSIS — E282 Polycystic ovarian syndrome: Secondary | ICD-10-CM | POA: Diagnosis not present

## 2016-12-31 DIAGNOSIS — F419 Anxiety disorder, unspecified: Secondary | ICD-10-CM | POA: Diagnosis not present

## 2016-12-31 DIAGNOSIS — Z791 Long term (current) use of non-steroidal anti-inflammatories (NSAID): Secondary | ICD-10-CM | POA: Diagnosis not present

## 2016-12-31 DIAGNOSIS — R102 Pelvic and perineal pain: Secondary | ICD-10-CM | POA: Diagnosis not present

## 2016-12-31 DIAGNOSIS — Z23 Encounter for immunization: Secondary | ICD-10-CM | POA: Diagnosis not present

## 2016-12-31 DIAGNOSIS — F329 Major depressive disorder, single episode, unspecified: Secondary | ICD-10-CM | POA: Diagnosis not present

## 2016-12-31 DIAGNOSIS — N921 Excessive and frequent menstruation with irregular cycle: Secondary | ICD-10-CM

## 2016-12-31 MED ORDER — SERTRALINE HCL 50 MG PO TABS: 50.0000 mg | ORAL_TABLET | Freq: Every day | ORAL | 0 refills | Status: DC

## 2016-12-31 NOTE — Progress Notes (Signed)
Assessment & Plan:  Chloe Cervantes was seen today for new patient (initial visit).  Diagnoses and all orders for this visit:  Metrorrhagia -     Ambulatory referral to Gynecology  Anxiety and depression -     sertraline (ZOLOFT) 50 MG tablet; Take 1 tablet (50 mg total) daily by mouth. -     TSH Denies SI/HI Follow up 2-3 weeks.   Needs flu shot -     Flu Vaccine QUAD 6+ mos PF IM (Fluarix Quad PF)     Subjective:   Chief Complaint  Patient presents with  . New Patient (Initial Visit)    Patient stated that she is haviing little bleeding for the past 3 weeks. Patient stated that she got her menstural period on July 26, 2016.    HPI Chloe Cervantes 26 y.o. female presents to office today accompanied by her husband and her 2 young daughters.   Metrorrhagia She endorses a history of metrorrhagia and has been on OCP and received Depo Provera injections in the past. She reports her last regular menstrual cycle was July 26, 2016. Approximately 3 weeks ago she began alternating between spotting and bleeding as if she was having an actual menstrual cycle. She was evaluated in the ED on 11-18-2016 for pelvic pain and according to patient was told she had PCOS based on pelvic US. She does not currently have a gynecologist. I referral will be placed today.  Patient describes symptoms of  anxiety (mild), depression (moderate) and labile mood (moderate). Symptoms occur erratically during the cycle. Patient denies dyspareunia, menorrhagia and pelvic pain. Evaluation to date includes pelvic US (showing possible PCOS). Treatment to date includes OTC NSAIDs (not very effective). The patient is sexually active.    Depression Patient complains of depression. She complains of depressed mood, difficulty concentrating, fatigue and mood lability, irritability and has missed classes for the past week. . Onset was approximately several years ago, gradually worsening since that time.  She denies current suicidal  and homicidal plan or intent.   Family history significant for no psychiatric illness.Possible organic causes contributing are: nutritional.  Risk factors: previous episode of depression Previous treatment includes Zoloft and individual therapy. She complains of the following side effects from the treatment: none. Reports she stopped taking Zoloft in the past due to pregnancies.   Depression screen PHQ 2/9 12/31/2016  Decreased Interest 3  Down, Depressed, Hopeless 3  PHQ - 2 Score 6  Altered sleeping 2  Tired, decreased energy 3  Change in appetite 0  Feeling bad or failure about yourself  3  Trouble concentrating 2  Moving slowly or fidgety/restless 0  PHQ-9 Score 16   History reviewed. No pertinent past medical history.  History reviewed. No pertinent surgical history.  Family History  Problem Relation Age of Onset  . Diabetes Mother   . Hypertension Mother     Social History   Socioeconomic History  . Marital status: Married    Spouse name: Not on file  . Number of children: Not on file  . Years of education: Not on file  . Highest education level: Not on file  Social Needs  . Financial resource strain: Not on file  . Food insecurity - worry: Not on file  . Food insecurity - inability: Not on file  . Transportation needs - medical: Not on file  . Transportation needs - non-medical: Not on file  Occupational History  . Not on file  Tobacco Use  . Smoking status:  Current Some Day Smoker  . Smokeless tobacco: Never Used  Substance and Sexual Activity  . Alcohol use: No  . Drug use: No  . Sexual activity: Yes  Other Topics Concern  . Not on file  Social History Narrative  . Not on file    Outpatient Medications Prior to Visit  Medication Sig Dispense Refill  . ibuprofen (ADVIL,MOTRIN) 800 MG tablet Take 1 tablet (800 mg total) by mouth 3 (three) times daily. (Patient not taking: Reported on 12/31/2016) 21 tablet 0   No facility-administered medications prior  to visit.     No Known Allergies  Review of Systems  Constitutional: Negative for fever, malaise/fatigue and weight loss.  HENT: Negative.  Negative for nosebleeds.   Eyes: Negative.  Negative for blurred vision, double vision and photophobia.  Respiratory: Negative.  Negative for cough and shortness of breath.   Cardiovascular: Negative.  Negative for chest pain, palpitations and leg swelling.  Gastrointestinal: Negative.  Negative for abdominal pain, constipation, diarrhea, heartburn, nausea and vomiting.  Genitourinary:       Metrorrhagia   Musculoskeletal: Negative.  Negative for myalgias.  Neurological: Positive for headaches. Negative for dizziness, focal weakness and seizures.  Endo/Heme/Allergies: Negative for environmental allergies.  Psychiatric/Behavioral: Positive for depression. Negative for hallucinations, memory loss, substance abuse and suicidal ideas. The patient is nervous/anxious. The patient does not have insomnia.        Objective:    Physical Exam  Constitutional: She is oriented to person, place, and time. She appears well-developed and well-nourished. She is cooperative.  HENT:  Head: Normocephalic and atraumatic.  Eyes: EOM are normal.  Neck: Normal range of motion.  Cardiovascular: Normal rate, regular rhythm, normal heart sounds and intact distal pulses. Exam reveals no gallop and no friction rub.  No murmur heard. Pulmonary/Chest: Effort normal and breath sounds normal. No tachypnea. No respiratory distress. She has no decreased breath sounds. She has no wheezes. She has no rhonchi. She has no rales. She exhibits no tenderness.  Abdominal: Soft. Bowel sounds are normal.  Musculoskeletal: Normal range of motion. She exhibits no edema.  Neurological: She is alert and oriented to person, place, and time. Coordination normal.  Skin: Skin is warm and dry.  Psychiatric: She has a normal mood and affect. Her speech is normal and behavior is normal. Judgment  and thought content normal. She expresses no homicidal and no suicidal ideation. She expresses no suicidal plans and no homicidal plans.  Nursing note and vitals reviewed.   BP 132/79 (BP Location: Right Arm, Patient Position: Sitting, Cuff Size: Normal)   Pulse 86   Temp 98.8 F (37.1 C) (Oral)   Resp 18   Ht 5\' 2"  (1.575 m)   Wt 215 lb (97.5 kg)   LMP 07/26/2016   BMI 39.32 kg/m  Wt Readings from Last 3 Encounters:  12/31/16 215 lb (97.5 kg)  11/18/16 215 lb (97.5 kg)   Patient has been counseled on age-appropriate routine health concerns for screening and prevention. These are reviewed and up-to-date. Referrals have been placed accordingly. Immunizations are up-to-date or declined.        Patient has been counseled extensively about nutrition and exercise as well as the importance of adherence with medications and regular follow-up. The patient was given clear instructions to go to ER or return to medical center if symptoms don't improve, worsen or new problems develop. The patient verbalized understanding.   Follow-up: Return in about 3 weeks (around 01/21/2017) for PAP SMEAR, zoloft  and GYN REFERRAL   Claiborne Rigg, FNP-BC [facility] [place], [place] [phone]   12/31/2016, 5:28 PM

## 2016-12-31 NOTE — Patient Instructions (Addendum)
Polycystic Ovarian Syndrome Polycystic ovarian syndrome (PCOS) is a common hormonal disorder among women of reproductive age. In most women with PCOS, many small fluid-filled sacs (cysts) grow on the ovaries, and the cysts are not part of a normal menstrual cycle. PCOS can cause problems with your menstrual periods and make it difficult to get pregnant. It can also cause an increased risk of miscarriage with pregnancy. If it is not treated, PCOS can lead to serious health problems, such as diabetes and heart disease. What are the causes? The cause of PCOS is not known, but it may be the result of a combination of certain factors, such as:  Irregular menstrual cycle.  High levels of certain hormones (androgens).  Problems with the hormone that helps to control blood sugar (insulin resistance).  Certain genes.  What increases the risk? This condition is more likely to develop in women who have a family history of PCOS. What are the signs or symptoms? Symptoms of PCOS may include:  Multiple ovarian cysts.  Infrequent periods or no periods.  Periods that are too frequent or too heavy.  Unpredictable periods.  Inability to get pregnant (infertility) because of not ovulating.  Increased growth of hair on the face, chest, stomach, back, thumbs, thighs, or toes.  Acne or oily skin. Acne may develop during adulthood, and it may not respond to treatment.  Pelvic pain.  Weight gain or obesity.  Patches of thickened and dark brown or black skin on the neck, arms, breasts, or thighs (acanthosis nigricans).  Excess hair growth on the face, chest, abdomen, or upper thighs (hirsutism).  How is this diagnosed? This condition is diagnosed based on:  Your medical history.  A physical exam, including a pelvic exam. Your health care provider may look for areas of increased hair growth on your skin.  Tests, such as: ? Ultrasound. This may be used to examine the ovaries and the lining of the  uterus (endometrium) for cysts. ? Blood tests. These may be used to check levels of sugar (glucose), female hormone (testosterone), and female hormones (estrogen and progesterone) in your blood.  How is this treated? There is no cure for PCOS, but treatment can help to manage symptoms and prevent more health problems from developing. Treatment varies depending on:  Your symptoms.  Whether you want to have a baby or whether you need birth control (contraception).  Treatment may include nutrition and lifestyle changes along with:  Progesterone hormone to start a menstrual period.  Birth control pills to help you have regular menstrual periods.  Medicines to make you ovulate, if you want to get pregnant.  Medicine to reduce excessive hair growth.  Surgery, in severe cases. This may involve making small holes in one or both of your ovaries. This decreases the amount of testosterone that your body produces.  Follow these instructions at home:  Take over-the-counter and prescription medicines only as told by your health care provider.  Follow a healthy meal plan. This can help you reduce the effects of PCOS. ? Eat a healthy diet that includes lean proteins, complex carbohydrates, fresh fruits and vegetables, low-fat dairy products, and healthy fats. Make sure to eat enough fiber.  If you are overweight, lose weight as told by your health care provider. ? Losing 10% of your body weight may improve symptoms. ? Your health care provider can determine how much weight loss is best for you and can help you lose weight safely.  Keep all follow-up visits as told by   your health care provider. This is important. Contact a health care provider if:  Your symptoms do not get better with medicine.  You develop new symptoms. This information is not intended to replace advice given to you by your health care provider. Make sure you discuss any questions you have with your health care  provider. Document Released: 05/28/2004 Document Revised: 09/30/2015 Document Reviewed: 07/20/2015 Elsevier Interactive Patient Education  2018 [employer]  Metrorrhagia Metrorrhagia is bleeding from the uterus that is not normal. The bleeding usually happens between periods. It happens often. Follow these instructions at home: Pay attention to changes in your symptoms. Follow these instructions to help with your condition: Eating and drinking  Eat many kinds of foods.  Eat foods that have the nutrient called iron. Some foods with iron are: ? Liver. ? Meat. ? Shellfish. ? Green leafy vegetables. ? Eggs.  If you have trouble going poop (constipation): ? Drink plenty of water. ? Eat fruits and vegetables that have a lot of fiber, such as spinach, carrots, raspberries, apples, and mango. Medicines  Take over-the-counter and prescription medicines only as told by your doctor.  Do not change medicines without talking with your doctor.  Do not take aspirin or medicines that have aspirin: ? During the week before your period. ? During your period.  Take iron pills exactly as told by your doctor. Activity  If you need to change your pad or tampon more than one time in 2 hours: ? Lie in bed with your feet raised (elevated). ? Put a cold pack on your lower belly (abdomen). ? Rest as much as possible.  Do not try to lose weight until the bleeding has stopped and your blood iron level is okay. Other Instructions  For two months, write down: ? When your period starts. ? When your period ends. ? When you have bleeding that is not during your period. ? What problems you notice.  Keep all follow-up visits as told by your doctor. This is important. Contact a doctor if:  You feel dizzy.  You feel like you are going to pass out (faint).  You feel weak.  You feel sick to your stomach (nauseous).  You throw up (vomit).  You cannot eat or drink without throwing up.  You  feel dizzy while you use medicine.  You have watery poop (diarrhea) while you use medicine.  You want to change the birth control pills or hormones that you take.  You want to stop taking birth control pills or hormones. Get help right away if:  You have a fever.  You have chills.  You need to change your pad or tampon more than one time in an hour.  You have more bleeding from your vagina than before.  You have clumps of blood coming from your vagina.  You have pain in your belly.  You pass out.  You have a rash. This information is not intended to replace advice given to you by your health care provider. Make sure you discuss any questions you have with your health care provider. Document Released: 04/26/2011 Document Revised: 07/10/2015 Document Reviewed: 04/29/2014 Elsevier Interactive Patient Education  2018 [employer]  Heartburn Heartburn is a type of pain or discomfort that can happen in the throat or chest. It is often described as a burning pain. It may also cause a bad taste in the mouth. Heartburn may feel worse when you lie down or bend over, and it is often worse at night.  Heartburn may be caused by stomach contents that move back up into the esophagus (reflux). Follow these instructions at home: Take these actions to decrease your discomfort and to help avoid complications. Diet  Follow a diet as recommended by your health care provider. This may involve avoiding foods and drinks such as: ? Coffee and tea (with or without caffeine). ? Drinks that contain alcohol. ? Energy drinks and sports drinks. ? Carbonated drinks or sodas. ? Chocolate and cocoa. ? Peppermint and mint flavorings. ? Garlic and onions. ? Horseradish. ? Spicy and acidic foods, including peppers, chili powder, curry powder, vinegar, hot sauces, and barbecue sauce. ? Citrus fruit juices and citrus fruits, such as oranges, lemons, and limes. ? Tomato-based foods, such as red sauce, chili,  salsa, and pizza with red sauce. ? Fried and fatty foods, such as donuts, french fries, potato chips, and high-fat dressings. ? High-fat meats, such as hot dogs and fatty cuts of red and white meats, such as rib eye steak, sausage, ham, and bacon. ? High-fat dairy items, such as whole milk, butter, and cream cheese.  Eat small, frequent meals instead of large meals.  Avoid drinking large amounts of liquid with your meals.  Avoid eating meals during the 2-3 hours before bedtime.  Avoid lying down right after you eat.  Do not exercise right after you eat. General instructions  Pay attention to any changes in your symptoms.  Take over-the-counter and prescription medicines only as told by your health care provider. Do not take aspirin, ibuprofen, or other NSAIDs unless your health care provider told you to do so.  Do not use any tobacco products, including cigarettes, chewing tobacco, and e-cigarettes. If you need help quitting, ask your health care provider.  Wear loose-fitting clothing. Do not wear anything tight around your waist that causes pressure on your abdomen.  Raise (elevate) the head of your bed about 6 inches (15 cm).  Try to reduce your stress, such as with yoga or meditation. If you need help reducing stress, ask your health care provider.  If you are overweight, reduce your weight to an amount that is healthy for you. Ask your health care provider for guidance about a safe weight loss goal.  Keep all follow-up visits as told by your health care provider. This is important. Contact a health care provider if:  You have new symptoms.  You have unexplained weight loss.  You have difficulty swallowing, or it hurts to swallow.  You have wheezing or a persistent cough.  Your symptoms do not improve with treatment.  You have frequent heartburn for more than two weeks. Get help right away if:  You have pain in your arms, neck, jaw, teeth, or back.  You feel sweaty,  dizzy, or light-headed.  You have chest pain or shortness of breath.  You vomit and your vomit looks like blood or coffee grounds.  Your stool is bloody or black. This information is not intended to replace advice given to you by your health care provider. Make sure you discuss any questions you have with your health care provider. Document Released: 06/20/2008 Document Revised: 07/10/2015 Document Reviewed: 05/29/2014 Elsevier Interactive Patient Education  2017 [employer]  Food Choices for Gastroesophageal Reflux Disease, Adult When you have gastroesophageal reflux disease (GERD), the foods you eat and your eating habits are very important. Choosing the right foods can help ease the discomfort of GERD. Consider working with a diet and nutrition specialist (dietitian) to help you make healthy  food choices. What general guidelines should I follow? Eating plan  Choose healthy foods low in fat, such as fruits, vegetables, whole grains, low-fat dairy products, and lean meat, fish, and poultry.  Eat frequent, small meals instead of three large meals each day. Eat your meals slowly, in a relaxed setting. Avoid bending over or lying down until 2-3 hours after eating.  Limit high-fat foods such as fatty meats or fried foods.  Limit your intake of oils, butter, and shortening to less than 8 teaspoons each day.  Avoid the following: ? Foods that cause symptoms. These may be different for different people. Keep a food diary to keep track of foods that cause symptoms. ? Alcohol. ? Drinking large amounts of liquid with meals. ? Eating meals during the 2-3 hours before bed.  Cook foods using methods other than frying. This may include baking, grilling, or broiling. Lifestyle   Maintain a healthy weight. Ask your health care provider what weight is healthy for you. If you need to lose weight, work with your health care provider to do so safely.  Exercise for at least 30 minutes on 5 or more  days each week, or as told by your health care provider.  Avoid wearing clothes that fit tightly around your waist and chest.  Do not use any products that contain nicotine or tobacco, such as cigarettes and e-cigarettes. If you need help quitting, ask your health care provider.  Sleep with the head of your bed raised. Use a wedge under the mattress or blocks under the bed frame to raise the head of the bed. What foods are not recommended? The items listed may not be a complete list. Talk with your dietitian about what dietary choices are best for you. Grains Pastries or quick breads with added fat. [place] toast. Vegetables Deep fried vegetables. [place] fries. Any vegetables prepared with added fat. Any vegetables that cause symptoms. For some people this may include tomatoes and tomato products, chili peppers, onions and garlic, and horseradish. Fruits Any fruits prepared with added fat. Any fruits that cause symptoms. For some people this may include citrus fruits, such as oranges, grapefruit, pineapple, and lemons. Meats and other protein foods High-fat meats, such as fatty beef or pork, hot dogs, ribs, ham, sausage, salami and bacon. Fried meat or protein, including fried fish and fried chicken. Nuts and nut butters. Dairy Whole milk and chocolate milk. Sour cream. Cream. Ice cream. Cream cheese. Milk shakes. Beverages Coffee and tea, with or without caffeine. Carbonated beverages. Sodas. Energy drinks. Fruit juice made with acidic fruits (such as orange or grapefruit). Tomato juice. Alcoholic drinks. Fats and oils Butter. Margarine. Shortening. Ghee. Sweets and desserts Chocolate and cocoa. Donuts. Seasoning and other foods Pepper. Peppermint and spearmint. Any condiments, herbs, or seasonings that cause symptoms. For some people, this may include curry, hot sauce, or vinegar-based salad dressings. Summary  When you have gastroesophageal reflux disease (GERD), food and lifestyle  choices are very important to help ease the discomfort of GERD.  Eat frequent, small meals instead of three large meals each day. Eat your meals slowly, in a relaxed setting. Avoid bending over or lying down until 2-3 hours after eating.  Limit high-fat foods such as fatty meat or fried foods. This information is not intended to replace advice given to you by your health care provider. Make sure you discuss any questions you have with your health care provider. Document Released: 02/01/2005 Document Revised: 02/03/2016 Document Reviewed: 02/03/2016 Elsevier Interactive Patient  Education  2017 [employer] Inc.  

## 2017-01-01 LAB — TSH: TSH: 1.32 u[IU]/mL (ref 0.450–4.500)

## 2017-01-03 ENCOUNTER — Telehealth: Payer: Self-pay

## 2017-01-03 NOTE — Telephone Encounter (Signed)
-----   Message from Zelda W Fleming, NP sent at 01/03/2017 10:46 AM EST ----- Thyroid is normal. 

## 2017-01-03 NOTE — Telephone Encounter (Signed)
Patient informed on her lab result.   Patient verified her DOB.

## 2017-01-03 NOTE — Telephone Encounter (Signed)
-----   Message from Claiborne RiggZelda W Fleming, NP sent at 01/03/2017 10:46 AM EST ----- Thyroid is normal.

## 2017-01-21 ENCOUNTER — Encounter: Payer: Self-pay | Admitting: Nurse Practitioner

## 2017-01-21 ENCOUNTER — Ambulatory Visit: Payer: Medicaid Other | Attending: Nurse Practitioner | Admitting: Nurse Practitioner

## 2017-01-21 VITALS — BP 117/76 | HR 73 | Temp 98.5°F | Ht 62.0 in | Wt 212.2 lb

## 2017-01-21 DIAGNOSIS — F419 Anxiety disorder, unspecified: Secondary | ICD-10-CM | POA: Diagnosis not present

## 2017-01-21 DIAGNOSIS — F320 Major depressive disorder, single episode, mild: Secondary | ICD-10-CM

## 2017-01-21 DIAGNOSIS — R87619 Unspecified abnormal cytological findings in specimens from cervix uteri: Secondary | ICD-10-CM | POA: Insufficient documentation

## 2017-01-21 DIAGNOSIS — Z79899 Other long term (current) drug therapy: Secondary | ICD-10-CM | POA: Diagnosis not present

## 2017-01-21 DIAGNOSIS — F329 Major depressive disorder, single episode, unspecified: Secondary | ICD-10-CM | POA: Insufficient documentation

## 2017-01-21 DIAGNOSIS — N939 Abnormal uterine and vaginal bleeding, unspecified: Secondary | ICD-10-CM | POA: Insufficient documentation

## 2017-01-21 MED ORDER — TRANEXAMIC ACID 650 MG PO TABS: 1300.0000 mg | ORAL_TABLET | Freq: Three times a day (TID) | ORAL | 1 refills | Status: DC

## 2017-01-21 NOTE — Progress Notes (Signed)
Assessment & Plan:  Chloe Cervantes was seen today for abnormal pap smear.  Diagnoses and all orders for this visit:  Abnormal uterine bleeding (AUB) -     CBC -     tranexamic acid (LYSTEDA) 650 MG TABS tablet; Take 2 tablets (1,300 mg total) by mouth 3 (three) times daily.   Current mild episode of major depressive disorder without prior episode (HCC) Stable. Endorses significant improvement of mood on zoloft. No changes made today. Denies SI/HI.       Patient has been counseled on age-appropriate routine health concerns for screening and prevention. These are reviewed and up-to-date. Referrals have been placed accordingly. Immunizations are up-to-date or declined.    Subjective:   Chief Complaint  Patient presents with  . Abnormal Pap Smear    Patient stated she is on her menstrual for 3 weeks. Patient stated she's having pain on both side on her abdominal and big clots.    HPI Chloe Cervantes 26 y.o. female presents to office today for AUB. She was scheduled to have a PAP smear performed today however due to heavy bleeding we were unable to proceed with PAP smear. She was referred to GYN however they have not contacted her as of today to schedule an appointment. I placed [facility] coordinator and [facility] They have assured me a call will be placed to the patient today for scheduling.   Diagnoses and all orders for this visit:  Abnormal uterine bleeding (AUB) Patient complains of menstrual symptoms. Symptoms began several weeks ago. Patient describes symptoms of  depression (mild), labile mood (mild), menorrhagia (severe), menstrual cramping (moderate), pelvic pain (moderate) and heavy clot formation. . Symptoms occur with metrorrhagia. Patient denies breast tenderness and migraine headaches. Evaluation to date includes pelvic US (abnormal: with multiple ovarian cysts (no fibroids): likely PCOS. Treatment to date includes ED admission, pelvic US, Ibuprofen. The  patient is sexually active.  Endorses having to change her sanitary pad every hour or two. Heavy clots and bleeding when she stands up. No bleeding when she is lying down or sitting in chair. She is also experiencing abdominal and back pain 6/10.    Anxiety and Depression She was started on zoloft over a month ago at her last office visit. Today she reports significant improvement in her mood. She would like to continue on her current dose of zoloft at this time.  Depression screen PHQ 2/9 01/21/2017  Decreased Interest 1  Down, Depressed, Hopeless 0  PHQ - 2 Score 1  Altered sleeping 2  Tired, decreased energy 2  Change in appetite 0  Feeling bad or failure about yourself  0  Trouble concentrating 0  Moving slowly or fidgety/restless 0  Suicidal thoughts 0  PHQ-9 Score 5    Review of Systems  Constitutional: Positive for malaise/fatigue. Negative for chills, diaphoresis, fever and weight loss.  Respiratory: Negative.  Negative for cough, sputum production, shortness of breath and wheezing.   Cardiovascular: Negative.  Negative for chest pain, orthopnea and leg swelling.  Gastrointestinal: Positive for abdominal pain. Negative for blood in stool, constipation, diarrhea, melena, nausea and vomiting.  Genitourinary: Positive for flank pain. Negative for dysuria, frequency, hematuria and urgency.       Menorrhagia with metrorrhagia  Musculoskeletal: Positive for back pain.  Skin: Negative.   Neurological: Negative.  Negative for dizziness, sensory change, speech change, loss of consciousness, weakness and headaches.  Psychiatric/Behavioral: Positive for depression. Negative for hallucinations, memory loss, substance abuse  and suicidal ideas. The patient is nervous/anxious. The patient does not have insomnia.     History reviewed. No pertinent past medical history.  History reviewed. No pertinent surgical history.  Family History  Problem Relation Age of Onset  . Diabetes Mother   .  Hypertension Mother     Social History Reviewed with no changes to be made today.   Outpatient Medications Prior to Visit  Medication Sig Dispense Refill  . sertraline (ZOLOFT) 50 MG tablet Take 1 tablet (50 mg total) daily by mouth. 90 tablet 0   No facility-administered medications prior to visit.     No Known Allergies     Objective:    BP 117/76 (BP Location: Right Arm, Patient Position: Sitting, Cuff Size: Normal)   Pulse 73   Temp 98.5 F (36.9 C) (Oral)   Ht 5\' 2"  (1.575 m)   Wt 212 lb 3.2 oz (96.3 kg)   SpO2 99%   BMI 38.81 kg/m  Wt Readings from Last 3 Encounters:  01/21/17 212 lb 3.2 oz (96.3 kg)  12/31/16 215 lb (97.5 kg)  11/18/16 215 lb (97.5 kg)    Physical Exam  Constitutional: She is oriented to person, place, and time. She appears well-developed and well-nourished.  HENT:  Head: Normocephalic and atraumatic.  Right Ear: External ear normal.  Left Ear: External ear normal.  Nose: Nose normal.  Mouth/Throat: Oropharynx is clear and moist. No oropharyngeal exudate.  Eyes: Conjunctivae and EOM are normal. Pupils are equal, round, and reactive to light. Right eye exhibits no discharge. No scleral icterus.  Neck: Normal range of motion. Neck supple. No tracheal deviation present. No thyromegaly present.  Cardiovascular: Normal rate, regular rhythm, normal heart sounds and intact distal pulses. Exam reveals no friction rub.  No murmur heard. Pulmonary/Chest: Effort normal and breath sounds normal. No accessory muscle usage. No respiratory distress. She has no decreased breath sounds. She has no wheezes. She has no rhonchi. She has no rales. She exhibits no tenderness. Right breast exhibits no inverted nipple, no mass, no nipple discharge, no skin change and no tenderness. Left breast exhibits no inverted nipple, no mass, no nipple discharge, no skin change and no tenderness. Breasts are symmetrical.  Abdominal: Soft. Bowel sounds are normal. She exhibits no  distension, no pulsatile midline mass and no mass. There is no hepatosplenomegaly, splenomegaly or hepatomegaly. There is tenderness in the right lower quadrant, periumbilical area and left lower quadrant. There is no rigidity, no rebound, no guarding, no CVA tenderness, no tenderness at McBurney's point and negative Murphy's sign. No hernia. Hernia confirmed negative in the ventral area, confirmed negative in the right inguinal area and confirmed negative in the left inguinal area.  Musculoskeletal: Normal range of motion. She exhibits no edema, tenderness or deformity.  Lymphadenopathy:    She has no cervical adenopathy.  Neurological: She is alert and oriented to person, place, and time. She has normal reflexes. No cranial nerve deficit. Coordination normal.  Skin: Skin is warm and dry. No erythema.  Psychiatric: She has a normal mood and affect. Her speech is normal and behavior is normal. Judgment and thought content normal.       Patient has been counseled extensively about nutrition and exercise as well as the importance of adherence with medications and regular follow-up. The patient was given clear instructions to go to ER or return to medical center if symptoms don't improve, worsen or new problems develop. The patient verbalized understanding.   Follow-up: Return if  symptoms worsen or fail to improve. She is to call me in 5 days after taking new medication to discuss if bleeding has improved.   Claiborne Rigg, FNP-BC [facility] [place], [place] [phone]   01/21/2017, 12:54 PM

## 2017-01-21 NOTE — Patient Instructions (Addendum)
Pap Test Why am I having this test? A pap test is sometimes called a pap smear. It is a screening test that is used to check for signs of cancer of the vagina, cervix, and uterus. The test can also identify the presence of infection or precancerous changes. Your health care provider will likely recommend you have this test done on a regular basis. This test may be done:  Every 3 years, starting at age 24.  Every 5 years, in combination with testing for the presence of human papillomavirus (HPV).  More or less often depending on other medical conditions.  What kind of sample is taken? Using a small cotton swab, plastic spatula, or brush, your health care provider will collect a sample of cells from the surface of your cervix. Your cervix is the opening to your uterus, also called a womb. Secretions from the cervix and vagina may also be collected. How do I prepare for this test?  Be aware of where you are in your menstrual cycle. You may be asked to reschedule the test if you are menstruating on the day of the test.  You may need to reschedule if you have a known vaginal infection on the day of the test.  You may be asked to avoid douching or taking a bath the day before or the day of the test.  Some medicines can cause abnormal test results, such as digitalis and tetracycline. Talk with your health care provider before your test if you take one of these medicines. What do the results mean? Abnormal test results may indicate a number of health conditions. These may include:  Cancer. Although pap test results cannot be used to diagnose cancer of the cervix, vagina, or uterus, they may suggest the possibility of cancer. Further tests would be required to determine if cancer is present.  Sexually transmitted disease.  Fungal infection.  Parasite infection.  Herpes infection.  A condition causing or contributing to infertility.  It is your responsibility to obtain your test results.  Ask the lab or department performing the test when and how you will get your results. Contact your health care provider to discuss any questions you have about your results. Talk with your health care provider to discuss your results, treatment options, and if necessary, the need for more tests. Talk with your health care provider if you have any questions about your results. This information is not intended to replace advice given to you by your health care provider. Make sure you discuss any questions you have with your health care provider. Document Released: 04/24/2002 Document Revised: 10/08/2015 Document Reviewed: 06/25/2013 Elsevier Interactive Patient Education  2018 [employer] 18-39 Years, Female Preventive care refers to lifestyle choices and visits with your health care provider that can promote health and wellness. What does preventive care include?  A yearly physical exam. This is also called an annual well check.  Dental exams once or twice a year.  Routine eye exams. Ask your health care provider how often you should have your eyes checked.  Personal lifestyle choices, including: ? Daily care of your teeth and gums. ? Regular physical activity. ? Eating a healthy diet. ? Avoiding tobacco and drug use. ? Limiting alcohol use. ? Practicing safe sex. ? Taking vitamin and mineral supplements as recommended by your health care provider. What happens during an annual well check? The services and screenings done by your health care provider during your annual well check will depend on  your age, overall health, lifestyle risk factors, and family history of disease. Counseling Your health care provider may ask you questions about your:  Alcohol use.  Tobacco use.  Drug use.  Emotional well-being.  Home and relationship well-being.  Sexual activity.  Eating habits.  Work and work Statistician.  Method of birth control.  Menstrual  cycle.  Pregnancy history.  Screening You may have the following tests or measurements:  Height, weight, and BMI.  Diabetes screening. This is done by checking your blood sugar (glucose) after you have not eaten for a while (fasting).  Blood pressure.  Lipid and cholesterol levels. These may be checked every 5 years starting at age 2.  Skin check.  Hepatitis C blood test.  Hepatitis B blood test.  Sexually transmitted disease (STD) testing.  BRCA-related cancer screening. This may be done if you have a family history of breast, ovarian, tubal, or peritoneal cancers.  Pelvic exam and Pap test. This may be done every 3 years starting at age 11. Starting at age 80, this may be done every 5 years if you have a Pap test in combination with an HPV test.  Discuss your test results, treatment options, and if necessary, the need for more tests with your health care provider. Vaccines Your health care provider may recommend certain vaccines, such as:  Influenza vaccine. This is recommended every year.  Tetanus, diphtheria, and acellular pertussis (Tdap, Td) vaccine. You may need a Td booster every 10 years.  Varicella vaccine. You may need this if you have not been vaccinated.  HPV vaccine. If you are 72 or younger, you may need three doses over 6 months.  Measles, mumps, and rubella (MMR) vaccine. You may need at least one dose of MMR. You may also need a second dose.  Pneumococcal 13-valent conjugate (PCV13) vaccine. You may need this if you have certain conditions and were not previously vaccinated.  Pneumococcal polysaccharide (PPSV23) vaccine. You may need one or two doses if you smoke cigarettes or if you have certain conditions.  Meningococcal vaccine. One dose is recommended if you are age 58-21 years and a first-year college student living in a residence hall, or if you have one of several medical conditions. You may also need additional booster doses.  Hepatitis A  vaccine. You may need this if you have certain conditions or if you travel or work in places where you may be exposed to hepatitis A.  Hepatitis B vaccine. You may need this if you have certain conditions or if you travel or work in places where you may be exposed to hepatitis B.  Haemophilus influenzae type b (Hib) vaccine. You may need this if you have certain risk factors.  Talk to your health care provider about which screenings and vaccines you need and how often you need them. This information is not intended to replace advice given to you by your health care provider. Make sure you discuss any questions you have with your health care provider. Document Released: 03/30/2001 Document Revised: 10/22/2015 Document Reviewed: 12/03/2014 Elsevier Interactive Patient Education  2017 [employer].  Pelvic Exam A pelvic exam is an exam of a woman's outer and inner genitals and reproductive organs. Pelvic exams are done to screen for health problems and to help prevent health problems from developing. You should start having pelvic exams when you turn 24 years old, unless your health care provider recommends having a pelvic exam earlier. Talk with your health care provider about how often you  should have a pelvic exam. During your pelvic exam, your health care provider may ask you questions about your health, your family's health, your menstrual periods, immunizations, and your sexual activity. The information shared between you and your health care provider will not be shared with anyone else. What are some reasons to have a pelvic exam? There are many possible reasons for having a pelvic exam. A pelvic exam may be recommended to check for:  Normal development and function of the reproductive organs.  Cancer of the ovaries, uterus, or vagina.  Signs of sexually transmitted infections (STIs) or other types of infections.  Pregnancy. If you are pregnant, a pelvic exam can also help determine how  far along you are in your pregnancy.  Widening (dilation) of the cervix during labor.  Injury (trauma) to the reproductive organs.  A pelvic exam may be recommended to help explain or diagnose:  Changes in your body that may be signs of cancer in the reproductive system.  Inability to get pregnant (infertility).  Vaginal itching or burning.  Abnormal vaginal discharge or bleeding.  Problems with sexual function.  Problems with urination, such as: ? Painful urination. ? Frequent urinary tract infections. ? Inability to control when you urinate (urinary incontinence).  Problems with menstrual periods, such as: ? Severe cramping. ? Absence of any menstrual flow in a female by the age of 13 years (primary amenorrhea). ? Stopping of menstrual flow for 3-6 months at a time (secondary amenorrhea).  Depending on the purpose of your pelvic exam, your health care provider may perform:  A Pap test. This is sometimes called a Pap smear. It is a screening test that is used to check for signs of cancer of the vagina, cervix, and uterus. The test can also identify the presence of infection or precancerous changes.  A cervical biopsy. This is the removal of a small sample of tissue from the cervix. The cervix is the lowest part of the womb (uterus), which opens into the vagina (birth canal). The tissue will be checked under a microscope.  Other diagnostic tests that involve taking samples of tissue or fluid (cultures).  If you have tests done, it is your responsibility to get your test results. Ask your health care provider or the department performing the test when your results will be ready. How is a pelvic exam performed? Usually, a physical exam is done first. This may include:  An exam of your breasts. Your health care provider may feel your breasts to check for abnormalities.  An exam of your abdomen. Your health care provider may press on your abdomen to check for  abnormalities.  Pelvic exams may vary among health care providers and hospitals. The following things are usually done during a pelvic exam:  You will remove your clothes from the waist down. You will put on a gown or a wrap to cover yourself while you get ready for the exam.  You will lie on your back on a special table. Your feet will be placed into foot rests (stirrups) so that your legs are wide apart and your knees are bent. A drape will be placed over your abdomen and your legs.  Your health care provider will examine your outer genitals to check for anything unusual. This includes your clitoris, urethra, vaginal opening, labia, and the skin between your vagina and your anus (perineum).  Your health care provider will examine your inner genitals. To do this, a lubricated instrument (speculum) will be inserted into  your vagina. The speculum will be widened to open the walls of your vagina. ? Your health care provider will examine your vagina and cervix. ? A Pap test, cervical biopsy, or cultures may be done as needed. ? After the internal exam is done, the speculum will be removed.  Your health care provider will put on germ-free (sterile) latex gloves and insert two fingers into your vagina to gently press against various organs. ? Your health care provider may use his or her other hand to gently press on your lower abdomen while doing this.  A pelvic exam is usually painless, although it can cause mild discomfort. If you experience pain at any time during your pelvic exam, tell your health care provider right away. When should I seek medical care? Seek medical care after your pelvic exam if:  You develop new symptoms.  You experience pain or discomfort from anything that was done during your pelvic exam.  This information is not intended to replace advice given to you by your health care provider. Make sure you discuss any questions you have with your health care provider. Document  Released: 04/24/2002 Document Revised: 06/18/2015 Document Reviewed: 11/05/2014 Elsevier Interactive Patient Education  2018 [employer].

## 2017-01-22 LAB — CBC
HEMATOCRIT: 29.9 % — AB (ref 34.0–46.6)
HEMOGLOBIN: 9.7 g/dL — AB (ref 11.1–15.9)
MCH: 25.3 pg — ABNORMAL LOW (ref 26.6–33.0)
MCHC: 32.4 g/dL (ref 31.5–35.7)
MCV: 78 fL — ABNORMAL LOW (ref 79–97)
Platelets: 509 10*3/uL — ABNORMAL HIGH (ref 150–379)
RBC: 3.83 x10E6/uL (ref 3.77–5.28)
RDW: 15 % (ref 12.3–15.4)
WBC: 8.2 10*3/uL (ref 3.4–10.8)

## 2017-01-23 ENCOUNTER — Other Ambulatory Visit: Payer: Self-pay | Admitting: Nurse Practitioner

## 2017-01-23 MED ORDER — FERROUS SULFATE 325 (65 FE) MG PO TABS: 325.0000 mg | ORAL_TABLET | Freq: Every day | ORAL | 3 refills | Status: DC

## 2017-01-26 ENCOUNTER — Telehealth: Payer: Self-pay

## 2017-01-26 NOTE — Telephone Encounter (Signed)
-----   Message from Zelda W Fleming, NP sent at 01/23/2017 12:17 AM EST ----- Your lab work shows low hemoglobin and hematocrit. Until you see gynecology I would like for you to start the iron tablets I have sent to the pharmacy.  Also please let me know if your bleeding has slowed down since starting the medication I have sent you. 

## 2017-01-26 NOTE — Telephone Encounter (Signed)
-----   Message from Claiborne RiggZelda W Fleming, NP sent at 01/23/2017 12:17 AM EST ----- Your lab work shows low hemoglobin and hematocrit. Until you see gynecology I would like for you to start the iron tablets I have sent to the pharmacy.  Also please let me know if your bleeding has slowed down since starting the medication I have sent you.

## 2017-01-26 NOTE — Telephone Encounter (Signed)
Patient did not answer. No voicemail was set up.  CMA attempt to call patient regarding lab result.   Communication letter will be sent out.

## 2017-01-31 ENCOUNTER — Telehealth: Payer: Self-pay | Admitting: Nurse Practitioner

## 2017-01-31 NOTE — Telephone Encounter (Signed)
Patient called to inform you that she is still bleeding and having blood clots

## 2017-02-01 ENCOUNTER — Other Ambulatory Visit: Payer: Self-pay | Admitting: Nurse Practitioner

## 2017-02-01 ENCOUNTER — Telehealth: Payer: Self-pay | Admitting: Nurse Practitioner

## 2017-02-01 DIAGNOSIS — F329 Major depressive disorder, single episode, unspecified: Secondary | ICD-10-CM

## 2017-02-01 DIAGNOSIS — F419 Anxiety disorder, unspecified: Principal | ICD-10-CM

## 2017-02-01 MED ORDER — SERTRALINE HCL 50 MG PO TABS: 50.0000 mg | ORAL_TABLET | Freq: Every day | ORAL | 0 refills | Status: DC

## 2017-02-01 MED ORDER — MEDROXYPROGESTERONE ACETATE 10 MG PO TABS: 10.0000 mg | ORAL_TABLET | Freq: Every day | ORAL | 0 refills | Status: DC

## 2017-02-01 NOTE — Telephone Encounter (Signed)
Patient called requesting medication refill on sertaline 50 mg

## 2017-02-01 NOTE — Telephone Encounter (Signed)
Please let patient know I have sent in a medication to her pharmacy for her to take for 10 days to help stop her bleeding.

## 2017-02-01 NOTE — Telephone Encounter (Signed)
Refilled

## 2017-02-21 ENCOUNTER — Ambulatory Visit (INDEPENDENT_AMBULATORY_CARE_PROVIDER_SITE_OTHER): Payer: Medicaid Other | Admitting: Obstetrics & Gynecology

## 2017-02-21 ENCOUNTER — Other Ambulatory Visit (HOSPITAL_COMMUNITY)
Admission: RE | Admit: 2017-02-21 | Discharge: 2017-02-21 | Disposition: A | Discharge status: Home / Self Care | Payer: Medicaid Other | Source: Ambulatory Visit | Attending: Obstetrics & Gynecology | Admitting: Obstetrics & Gynecology

## 2017-02-21 ENCOUNTER — Encounter: Payer: Self-pay | Admitting: Obstetrics & Gynecology

## 2017-02-21 VITALS — BP 130/68 | HR 66 | Ht 61.5 in | Wt 210.4 lb

## 2017-02-21 DIAGNOSIS — Z0000 Encounter for general adult medical examination without abnormal findings: Secondary | ICD-10-CM | POA: Insufficient documentation

## 2017-02-21 DIAGNOSIS — E282 Polycystic ovarian syndrome: Secondary | ICD-10-CM

## 2017-02-21 DIAGNOSIS — N921 Excessive and frequent menstruation with irregular cycle: Secondary | ICD-10-CM

## 2017-02-21 LAB — POCT PREGNANCY, URINE: Preg Test, Ur: NEGATIVE

## 2017-02-21 MED ORDER — NORGESTREL-ETHINYL ESTRADIOL 0.3-30 MG-MCG PO TABS: 1.0000 | ORAL_TABLET | Freq: Every day | ORAL | 11 refills | Status: DC

## 2017-02-21 NOTE — Patient Instructions (Signed)
Polycystic Ovarian Syndrome Polycystic ovarian syndrome (PCOS) is a common hormonal disorder among women of reproductive age. In most women with PCOS, many small fluid-filled sacs (cysts) grow on the ovaries, and the cysts are not part of a normal menstrual cycle. PCOS can cause problems with your menstrual periods and make it difficult to get pregnant. It can also cause an increased risk of miscarriage with pregnancy. If it is not treated, PCOS can lead to serious health problems, such as diabetes and heart disease. What are the causes? The cause of PCOS is not known, but it may be the result of a combination of certain factors, such as:  Irregular menstrual cycle.  High levels of certain hormones (androgens).  Problems with the hormone that helps to control blood sugar (insulin resistance).  Certain genes.  What increases the risk? This condition is more likely to develop in women who have a family history of PCOS. What are the signs or symptoms? Symptoms of PCOS may include:  Multiple ovarian cysts.  Infrequent periods or no periods.  Periods that are too frequent or too heavy.  Unpredictable periods.  Inability to get pregnant (infertility) because of not ovulating.  Increased growth of hair on the face, chest, stomach, back, thumbs, thighs, or toes.  Acne or oily skin. Acne may develop during adulthood, and it may not respond to treatment.  Pelvic pain.  Weight gain or obesity.  Patches of thickened and dark brown or black skin on the neck, arms, breasts, or thighs (acanthosis nigricans).  Excess hair growth on the face, chest, abdomen, or upper thighs (hirsutism).  How is this diagnosed? This condition is diagnosed based on:  Your medical history.  A physical exam, including a pelvic exam. Your health care provider may look for areas of increased hair growth on your skin.  Tests, such as: ? Ultrasound. This may be used to examine the ovaries and the lining of the  uterus (endometrium) for cysts. ? Blood tests. These may be used to check levels of sugar (glucose), female hormone (testosterone), and female hormones (estrogen and progesterone) in your blood.  How is this treated? There is no cure for PCOS, but treatment can help to manage symptoms and prevent more health problems from developing. Treatment varies depending on:  Your symptoms.  Whether you want to have a baby or whether you need birth control (contraception).  Treatment may include nutrition and lifestyle changes along with:  Progesterone hormone to start a menstrual period.  Birth control pills to help you have regular menstrual periods.  Medicines to make you ovulate, if you want to get pregnant.  Medicine to reduce excessive hair growth.  Surgery, in severe cases. This may involve making small holes in one or both of your ovaries. This decreases the amount of testosterone that your body produces.  Follow these instructions at home:  Take over-the-counter and prescription medicines only as told by your health care provider.  Follow a healthy meal plan. This can help you reduce the effects of PCOS. ? Eat a healthy diet that includes lean proteins, complex carbohydrates, fresh fruits and vegetables, low-fat dairy products, and healthy fats. Make sure to eat enough fiber.  If you are overweight, lose weight as told by your health care provider. ? Losing 10% of your body weight may improve symptoms. ? Your health care provider can determine how much weight loss is best for you and can help you lose weight safely.  Keep all follow-up visits as told by   your health care provider. This is important. Contact a health care provider if:  Your symptoms do not get better with medicine.  You develop new symptoms. This information is not intended to replace advice given to you by your health care provider. Make sure you discuss any questions you have with your health care  provider. Document Released: 05/28/2004 Document Revised: 09/30/2015 Document Reviewed: 07/20/2015 Elsevier Interactive Patient Education  2018 [employer]  Diet for Polycystic Ovarian Syndrome Polycystic ovary syndrome (PCOS) is a disorder of the chemical messengers (hormones) that regulate menstruation. The condition causes important hormones to be out of balance. PCOS can:  Make your periods irregular or stop.  Cause cysts to develop on the ovaries.  Make it difficult to get pregnant.  Stop your body from responding to the effects of insulin (insulin resistance), which can lead to obesity and diabetes.  Changing what you eat can help manage PCOS and improve your health. It can help you lose weight and improve the way your body uses insulin. What is my plan?  Eat breakfast, lunch, and dinner plus two snacks every day.  Include protein in each meal and snack.  Choose whole grains instead of products made with refined flour.  Eat a variety of foods.  Exercise regularly as told by your health care provider. What do I need to know about this eating plan? If you are overweight or obese, pay attention to how many calories you eat. Cutting down on calories can help you lose weight. Work with your health care provider or dietitian to figure out how many calories you need each day. What foods can I eat? Grains Whole grains, such as whole wheat. Whole-grain breads, crackers, cereals, and pasta. Unsweetened oatmeal, bulgur, barley, quinoa, or brown rice. Corn or whole-wheat flour tortillas. Vegetables  Lettuce. Spinach. Peas. Beets. Cauliflower. Cabbage. Broccoli. Carrots. Tomatoes. Squash. Eggplant. Herbs. Peppers. Onions. Cucumbers. Brussels sprouts. Fruits Berries. Bananas. Apples. Oranges. Grapes. Papaya. Mango. Pomegranate. Kiwi. Grapefruit. Cherries. Meats and Other Protein Sources Lean proteins, such as fish, chicken, beans, eggs, and tofu. Dairy Low-fat dairy products, such  as skim milk, cheese sticks, and yogurt. Beverages Low-fat or fat-free drinks, such as water, low-fat milk, sugar-free drinks, and 100% fruit juice. Condiments Ketchup. Mustard. Barbecue sauce. Relish. Low-fat or fat-free mayonnaise. Fats and Oils Olive oil or canola oil. Walnuts and almonds. The items listed above may not be a complete list of recommended foods or beverages. Contact your dietitian for more options. What foods are not recommended? Foods high in calories or fat. Fried foods. Sweets. Products made from refined white flour, including white bread, pastries, white rice, and pasta. The items listed above may not be a complete list of foods and beverages to avoid. Contact your dietitian for more information. This information is not intended to replace advice given to you by your health care provider. Make sure you discuss any questions you have with your health care provider. Document Released: 05/26/2015 Document Revised: 07/10/2015 Document Reviewed: 02/13/2014 Elsevier Interactive Patient Education  2018 [employer] Inc.  

## 2017-02-21 NOTE — Progress Notes (Signed)
Patient ID: Chloe Cervantes Bohl, female   DOB: January 14, 1991, 27 y.o.   MRN: [id number]  No chief complaint on file.   HPI Chloe Cervantes Broda is a 27 y.o. female. Married Hispanic G3 P2 201 (664 and 25 year old kids) [place] She is here today with the issue of heavy bleeding and PCOS. She was seen in the ER 10/18 with pain. An ultrasound showed a normal uterus and ovarian cysts consistent with PCOS.  Periods are always irregular, will skip several months up to a year. She will then have a 7 days of a heavy periods, although the last few months she had bled daily. She doesn't use contraception, wants a pregnancy. But she would consider OCPs to correct this issue.  HPI  No past medical history on file.  No past surgical history on file.  Family History  Problem Relation Age of Onset  . Diabetes Mother   . Hypertension Mother     Social History Social History   Tobacco Use  . Smoking status: Former Smoker    Types: Cigarettes    Last attempt to quit: 12/22/2016    Years since quitting: 0.1  . Smokeless tobacco: Never Used  Substance Use Topics  . Alcohol use: No  . Drug use: No    No Known Allergies  Current Outpatient Medications  Medication Sig Dispense Refill  . sertraline (ZOLOFT) 50 MG tablet Take 1 tablet (50 mg total) by mouth daily. 90 tablet 0  . ferrous sulfate 325 (65 FE) MG tablet Take 1 tablet (325 mg total) by mouth daily with breakfast. 30 tablet 3  . medroxyPROGESTERone (PROVERA) 10 MG tablet Take 1 tablet (10 mg total) by mouth daily for 10 days. 10 tablet 0   No current facility-administered medications for this visit.     Review of Systems Review of Systems  She has already had a flu vaccine this season.  Blood pressure 130/68, pulse 66, height 5' 1.5" (1.562 m), weight 210 lb 6.4 oz (95.4 kg).  Physical Exam Physical Exam  Morbidly obese pleasant lady Breathing, conversing, and ambulating normally Well nourished, well hydrated Hispanic female,  no apparent distress Cervix, parous, small amount of bleeding Bimanual exam normal   Data Reviewed IMPRESSION: 1. There are 2 adjacent left para ovarian cysts, both simple in appearance, largest measuring 4.1 cm. These are likely incidental findings. 2. Ovaries show small peripherally arranged cysts that raise the possibility of polycystic ovarian syndrome. Ovaries otherwise unremarkable with no evidence of torsion. 3. No other abnormalities.   Electronically Signed   By: Amie Portlandavid  Ormond M.D.   On: 11/18/2016 15:32   Assessment   PCOS with irregular bleeding Preventative care     Plan   Start OCPs after 2 weeks of protected IC Diet info given for PCOS Pap smear done Rec [facility] with iron        Akacia Boltz C Londell Noll 02/21/2017, 8:34 AM

## 2017-02-22 LAB — CYTOLOGY - PAP
Chlamydia: NEGATIVE
Diagnosis: NEGATIVE
Neisseria Gonorrhea: NEGATIVE

## 2017-05-16 ENCOUNTER — Other Ambulatory Visit: Payer: Self-pay

## 2017-05-16 ENCOUNTER — Ambulatory Visit: Payer: Medicaid Other | Attending: Nurse Practitioner | Admitting: *Deleted

## 2017-05-16 DIAGNOSIS — Z789 Other specified health status: Secondary | ICD-10-CM | POA: Diagnosis not present

## 2017-05-16 DIAGNOSIS — Z111 Encounter for screening for respiratory tuberculosis: Secondary | ICD-10-CM | POA: Diagnosis not present

## 2017-05-16 MED ORDER — TETANUS-DIPHTH-ACELL PERTUSSIS 5-2.5-18.5 LF-MCG/0.5 IM SUSP: 0.5000 mL | Freq: Once | INTRAMUSCULAR | 0 refills | Status: DC

## 2017-05-16 MED ORDER — TETANUS-DIPHTH-ACELL PERTUSSIS 5-2.5-18.5 LF-MCG/0.5 IM SUSP: 0.5000 mL | Freq: Once | INTRAMUSCULAR | 0 refills | Status: AC

## 2017-05-16 NOTE — Progress Notes (Signed)
Pharmacy request to have Tdap script print out.

## 2017-05-16 NOTE — Progress Notes (Signed)
PPD Placement note Chloe Cervantes, 27 y.o. female is here today for placement of PPD test Reason for PPD test:school Pt taken PPD test before: yes Verified in allergy area and with patient that they are not allergic to the products PPD is made of (Phenol or Tween). Yes Is patient taking any oral or IV steroid medication now or have they taken it in the last month? no Has the patient ever received the BCG vaccine?: no Has the patient been in recent contact with anyone known or suspected of having active TB disease?: no   Alert and oriented in NAD. P:  PPD placed on 05/16/2017.  Patient advised to return for reading within 48-72 hours.

## 2017-05-18 LAB — MEASLES/MUMPS/RUBELLA IMMUNITY
MUMPS ABS, IGG: 15.1 AU/mL (ref >10.9)
RUBEOLA AB, IGG: 36.3 [AU]/ml (ref >29.9)

## 2017-05-18 LAB — VARICELLA ZOSTER ANTIBODY, IGG: Varicella zoster IgG: 243 index (ref >165)

## 2017-05-18 MED FILL — $ENGERIX-B 20MCG/ML SYRN: 20 MCG/ML | 1 days supply | Qty: 1 | Fill #0

## 2017-05-18 MED FILL — $BOOSTRIX VACCINE SYRINGE: 5-2.5-18.5 | 1 days supply | Qty: 1 | Fill #0

## 2017-05-19 ENCOUNTER — Encounter: Payer: Self-pay | Admitting: *Deleted

## 2017-05-19 LAB — TB SKIN TEST
INDURATION: 0 mm
TB SKIN TEST: NEGATIVE

## 2017-07-18 ENCOUNTER — Ambulatory Visit: Payer: Medicaid Other | Admitting: Nurse Practitioner

## 2017-09-19 ENCOUNTER — Telehealth: Payer: Self-pay

## 2017-09-19 NOTE — Telephone Encounter (Signed)
NOT A CURRENT NUMBER

## 2017-09-21 DIAGNOSIS — E669 Obesity, unspecified: Secondary | ICD-10-CM | POA: Insufficient documentation

## 2017-11-21 DIAGNOSIS — N939 Abnormal uterine and vaginal bleeding, unspecified: Secondary | ICD-10-CM | POA: Diagnosis not present

## 2018-03-20 ENCOUNTER — Ambulatory Visit (HOSPITAL_COMMUNITY)
Admission: EM | Admit: 2018-03-20 | Discharge: 2018-03-20 | Disposition: A | Discharge status: Home / Self Care | Payer: Medicaid Other | Attending: Urgent Care | Admitting: Urgent Care

## 2018-03-20 ENCOUNTER — Encounter (HOSPITAL_COMMUNITY): Payer: Self-pay | Admitting: Emergency Medicine

## 2018-03-20 ENCOUNTER — Other Ambulatory Visit: Payer: Self-pay

## 2018-03-20 DIAGNOSIS — J069 Acute upper respiratory infection, unspecified: Secondary | ICD-10-CM

## 2018-03-20 DIAGNOSIS — B9789 Other viral agents as the cause of diseases classified elsewhere: Secondary | ICD-10-CM

## 2018-03-20 DIAGNOSIS — R070 Pain in throat: Secondary | ICD-10-CM

## 2018-03-20 HISTORY — DX: Polycystic ovarian syndrome: E28.2

## 2018-03-20 MED ORDER — CETIRIZINE HCL 10 MG PO TABS: 10.0000 mg | ORAL_TABLET | Freq: Every day | ORAL | 0 refills | Status: DC

## 2018-03-20 MED ORDER — BENZONATATE 100 MG PO CAPS: 100.0000 mg | ORAL_CAPSULE | Freq: Three times a day (TID) | ORAL | 0 refills | Status: DC | PRN

## 2018-03-20 MED ORDER — PSEUDOEPHEDRINE HCL ER 120 MG PO TB12: 120.0000 mg | ORAL_TABLET | Freq: Two times a day (BID) | ORAL | 3 refills | Status: DC

## 2018-03-20 NOTE — ED Provider Notes (Signed)
MRN: [id number] DOB: 1990-02-27  Subjective:   Chloe Cervantes is a 28 y.o. female presenting for 3-day history of moderate to severe, constant sharp throat pain.  Patient is concerned stating that she usually gets strep throat.  Over the past day she is also developed a runny nose, dry cough, right ear pain.  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  She also notes that her kids have also been sick with similar symptoms.  Known assisted positive for the flu.  She has not tried any medications for relief.  Denies smoking cigarettes.  Denies history of asthma.  She is not currently taking any chronic medications.  No Known Allergies  Past Medical History:  Diagnosis Date  . PCOS (polycystic ovarian syndrome)     History reviewed. No pertinent surgical history.  Review of Systems  Constitutional: Positive for malaise/fatigue. Negative for fever.  HENT: Positive for congestion (runny nose), ear pain (right) and sore throat.   Eyes:       No vision changes.  Respiratory: Positive for cough and wheezing (is not sure, reports that she is breathing heavir).   Cardiovascular: Negative for chest pain.  Gastrointestinal: Positive for nausea. Negative for abdominal pain and vomiting.  Genitourinary: Negative for dysuria, flank pain, frequency, hematuria and urgency.  Musculoskeletal: Negative for myalgias.  Skin: Negative for rash.  Neurological: Positive for dizziness (sitting up to fast) and headaches (intermittent).  Psychiatric/Behavioral: Negative for depression (history of depression) and substance abuse.    Objective:   Vitals: BP 129/81 (BP Location: Right Arm)   Pulse 80   Temp 97.9 F (36.6 C) (Temporal)   Resp 18   LMP 01/17/2018 Comment: pcos  SpO2 100%   Physical Exam Constitutional:      General: She is not in acute distress.    Appearance: Normal appearance. She is well-developed. She is not ill-appearing,  toxic-appearing or diaphoretic.  HENT:     Head: Normocephalic and atraumatic.     Right Ear: Tympanic membrane and ear canal normal. No drainage or tenderness. No middle ear effusion. Tympanic membrane is not erythematous.     Left Ear: Tympanic membrane and ear canal normal. No drainage or tenderness.  No middle ear effusion. Tympanic membrane is not erythematous.     Nose: Nose normal. No congestion or rhinorrhea.     Mouth/Throat:     Mouth: Mucous membranes are moist. No oral lesions.     Pharynx: Oropharynx is clear. No pharyngeal swelling, oropharyngeal exudate, posterior oropharyngeal erythema or uvula swelling.     Tonsils: No tonsillar exudate or tonsillar abscesses.  Eyes:     General: No scleral icterus.       Right eye: No discharge.        Left eye: No discharge.     Extraocular Movements: Extraocular movements intact.     Right eye: Normal extraocular motion.     Left eye: Normal extraocular motion.     Conjunctiva/sclera: Conjunctivae normal.     Pupils: Pupils are equal, round, and reactive to light.  Neck:     Musculoskeletal: Normal range of motion and neck supple.  Cardiovascular:     Rate and Rhythm: Normal rate and regular rhythm.     Pulses: Normal pulses.     Heart sounds: Normal heart sounds. No murmur. No friction rub. No gallop.   Pulmonary:     Effort: Pulmonary effort is normal. No respiratory distress.  Breath sounds: Normal breath sounds. No stridor. No wheezing, rhonchi or rales.  Lymphadenopathy:     Cervical: No cervical adenopathy.  Skin:    General: Skin is warm and dry.     Findings: No rash.  Neurological:     General: No focal deficit present.     Mental Status: She is alert and oriented to person, place, and time.  Psychiatric:        Mood and Affect: Mood normal.        Behavior: Behavior normal.        Thought Content: Thought content normal.     Assessment and Plan :   Viral URI with cough  Throat pain  Likely viral in  etiology d/t reassuring physical exam findings. Advised supportive care, offered symptomatic relief. Counseled patient on potential for adverse effects with medications prescribed today, patient verbalized understanding.  ER and return-to-clinic precautions discussed, patient verbalized understanding.    Wallis Bamberg, PA-C 03/20/18 1045

## 2018-03-20 NOTE — Discharge Instructions (Addendum)
We will manage this as a viral syndrome. For sore throat or cough try using a honey-based tea. Use 3 teaspoons of honey with juice squeezed from half lemon. Place shaved pieces of ginger into 1/2-1 cup of water and warm over stove top. Then mix the ingredients and repeat every 4 hours as needed. Please take ibuprofen 400mg  every 6 hours alternating with OR taken together with Tylenol 500mg  every 6 hours. Hydrate very well with at least 2 liters of water. Eat light meals such as soups to replenish electrolytes and soft fruits, veggies. Start an antihistamine like Zyrtec, Allegra or Claritin with Sudafed (pseudoephedrine) 60-120mg  twice daily to help with post-nasal drainage.

## 2018-03-20 NOTE — ED Triage Notes (Signed)
Throat pain with swallowing.  Onset Friday of symptoms.  Patient has a cough, runny nose, drainage in throat.

## 2018-04-03 ENCOUNTER — Ambulatory Visit: Payer: Medicaid Other | Admitting: Family Medicine

## 2018-04-07 ENCOUNTER — Ambulatory Visit (INDEPENDENT_AMBULATORY_CARE_PROVIDER_SITE_OTHER): Payer: BLUE CROSS/BLUE SHIELD | Admitting: Family Medicine

## 2018-04-07 ENCOUNTER — Encounter: Payer: Self-pay | Admitting: Family Medicine

## 2018-04-07 VITALS — BP 110/68 | HR 72 | Temp 98.2°F | Ht 61.0 in | Wt 216.0 lb

## 2018-04-07 DIAGNOSIS — F32A Depression, unspecified: Secondary | ICD-10-CM

## 2018-04-07 DIAGNOSIS — Z7689 Persons encountering health services in other specified circumstances: Secondary | ICD-10-CM | POA: Diagnosis not present

## 2018-04-07 DIAGNOSIS — F419 Anxiety disorder, unspecified: Secondary | ICD-10-CM | POA: Diagnosis not present

## 2018-04-07 DIAGNOSIS — D509 Iron deficiency anemia, unspecified: Secondary | ICD-10-CM | POA: Insufficient documentation

## 2018-04-07 DIAGNOSIS — F329 Major depressive disorder, single episode, unspecified: Secondary | ICD-10-CM

## 2018-04-07 DIAGNOSIS — E282 Polycystic ovarian syndrome: Secondary | ICD-10-CM

## 2018-04-07 NOTE — Progress Notes (Signed)
Patient presents to clinic today to establish care.  SUBJECTIVE: PMH: Pt is a 28 yo female with pmh sig for PCOS, anxiety, depression, h/o gestational DM and gestational HTN.  Pt previously seen in [place].  Has been seen by local UC since moving to the area.  PCOS: -dx'd 2 yrs ago -states u/s with multiple cyst on b/l ovaries -Seen in ED for abdominal pain after a cyst ruptured -was on OCPs but could not remember to take them -endorses having 2-3 periods in the last 3 yrs since the birth of her youngest daughter  Anxiety and depression: -symptoms since a teenager -also had post partum depression -was on Zoloft.  Helped some but stopped taking -was in counseling in the past  -notes irritability, picking fights with her husband for no reason, increased stress daughter states do not seem to listen to her.  History of anemia: -Not currently taking iron -Endorses feeling cold all the time -Denies palpitations -States has not been checked in "a while"  HTN: -Endorses gestational hypertension -Has not had any issues since pregnancy -Denies headaches, chest pain, changes in vision  History of gestational diabetes: -Has not had issues with blood sugar since  Allergies: NKDA  Past surgical history: None  Social history: Patient is married.  Pt and her husband moved to the area after he got out the [employer]	[place].  They were previously stationed at [employer].  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  Pt has 2 girls.  Patient notes her daughters misbehave/do not listen to her however they listen to their father.  Pt endorses social alcohol use.  Pt denies current tobacco or drug use.  Pt smoked in the past.   Health Maintenance: Dental --Dr. Dannielle Burn [facility] --[facility], Dr. Yetta Barre.  Last eye exam 03/2018 Immunizations --tetanus vaccine 2019, influenza vaccine 2012, TB test 2019 PAP --11/2016 LMP--04/2017    Past Medical History:  Diagnosis  Date  . Depression   . Diabetes mellitus without complication (HCC)   . Hypertension   . PCOS (polycystic ovarian syndrome)     History reviewed. No pertinent surgical history.  No current outpatient medications on file prior to visit.   No current facility-administered medications on file prior to visit.     No Known Allergies  Family History  Problem Relation Age of Onset  . Diabetes Mother   . Hypertension Mother     Social History   Socioeconomic History  . Marital status: Married    Spouse name: Not on file  . Number of children: Not on file  . Years of education: Not on file  . Highest education level: Not on file  Occupational History  . Not on file  Social Needs  . Financial resource strain: Not on file  . Food insecurity:    Worry: Not on file    Inability: Not on file  . Transportation needs:    Medical: Not on file    Non-medical: Not on file  Tobacco Use  . Smoking status: Former Smoker    Types: Cigarettes    Last attempt to quit: 12/22/2016    Years since quitting: 1.2  . Smokeless tobacco: Never Used  Substance and Sexual Activity  . Alcohol use: No  . Drug use: No  . Sexual activity: Yes    Birth control/protection: None  Lifestyle  . Physical activity:    Days per week: Not on file    Minutes per  session: Not on file  . Stress: Not on file  Relationships  . Social connections:    Talks on phone: Not on file    Gets together: Not on file    Attends religious service: Not on file    Active member of club or organization: Not on file    Attends meetings of clubs or organizations: Not on file    Relationship status: Not on file  . Intimate partner violence:    Fear of current or ex partner: Not on file    Emotionally abused: Not on file    Physically abused: Not on file    Forced sexual activity: Not on file  Other Topics Concern  . Not on file  Social History Narrative  . Not on file    ROS General: Denies fever, chills, night  sweats, changes in weight, changes in appetite  +cold intolerance, absent menses HEENT: Denies headaches, ear pain, changes in vision, rhinorrhea, sore throat CV: Denies CP, palpitations, SOB, orthopnea Pulm: Denies SOB, cough, wheezing GI: Denies abdominal pain, nausea, vomiting, diarrhea, constipation GU: Denies dysuria, hematuria, frequency, vaginal discharge Msk: Denies muscle cramps, joint pains Neuro: Denies weakness, numbness, tingling Skin: Denies rashes, bruising Psych: Denies hallucinations  + anxiety and depression   BP 110/68 (BP Location: Right Arm, Patient Position: Sitting, Cuff Size: Large)   Pulse 72   Temp 98.2 F (36.8 C) (Oral)   Ht 5\' 1"  (1.549 m)   Wt 216 lb (98 kg)   LMP 05/05/2017 (Approximate)   SpO2 98%   BMI 40.81 kg/m   Physical Exam Gen. Pleasant, well developed, well-nourished, in NAD HEENT - [place]/AT, PERRL, no scleral icterus, no nasal drainage, pharynx without erythema or exudate. Lungs: no use of accessory muscles, CTAB, no wheezes, rales or rhonchi Cardiovascular: RRR, No r/g/m, no peripheral edema Neuro:  A&Ox3, CN II-XII intact, normal gait Skin:  Warm, dry, intact, no lesions  No results found for this or any previous visit (from the past 2160 hour(s)).  Assessment/Plan: PCOS (polycystic ovarian syndrome) -Discussed various treatment options to help reduce insulin resistance, decrease weight, and regulate menses. -Given handouts -We will obtain labs at next [facility] for CPE  Anxiety and depression -PHQ 9 score 20 -Gad 7 score 18 -Discussed therapy.  Patient advised to schedule an appointment for counseling.  No referral needed. -Discussed obtaining labs including TSH, free T4 at next [facility].  Iron deficiency anemia, unspecified iron deficiency anemia type -discussed obtaining CBC.  Pt wishes to wait until CPE  Encounter to establish care -We reviewed the PMH, PSH, FH, SH, Meds and Allergies. -We provided refills for any medications we will  prescribe as needed. -We addressed current concerns per orders and patient instructions. -We have asked for records for pertinent exams, studies, vaccines and notes from previous providers. -We have advised patient to follow up per instructions below.  Follow-up in the next few weeks for CPE  Abbe Amsterdam, MD

## 2018-04-07 NOTE — Patient Instructions (Addendum)
Polycystic Ovarian Syndrome  Polycystic ovarian syndrome (PCOS) is a common hormonal disorder among women of reproductive age. In most women with PCOS, many small fluid-filled sacs (cysts) grow on the ovaries, and the cysts are not part of a normal menstrual cycle. PCOS can cause problems with your menstrual periods and make it difficult to get pregnant. It can also cause an increased risk of miscarriage with pregnancy. If it is not treated, PCOS can lead to serious health problems, such as diabetes and heart disease. What are the causes? The cause of PCOS is not known, but it may be the result of a combination of certain factors, such as:  Irregular menstrual cycle.  High levels of certain hormones (androgens).  Problems with the hormone that helps to control blood sugar (insulin resistance).  Certain genes. What increases the risk? This condition is more likely to develop in women who have a family history of PCOS. What are the signs or symptoms? Symptoms of PCOS may include:  Multiple ovarian cysts.  Infrequent periods or no periods.  Periods that are too frequent or too heavy.  Unpredictable periods.  Inability to get pregnant (infertility) because of not ovulating.  Increased growth of hair on the face, chest, stomach, back, thumbs, thighs, or toes.  Acne or oily skin. Acne may develop during adulthood, and it may not respond to treatment.  Pelvic pain.  Weight gain or obesity.  Patches of thickened and dark [place] or black skin on the neck, arms, breasts, or thighs (acanthosis nigricans).  Excess hair growth on the face, chest, abdomen, or upper thighs (hirsutism). How is this diagnosed? This condition is diagnosed based on:  Your medical history.  A physical exam, including a pelvic exam. Your health care provider may look for areas of increased hair growth on your skin.  Tests, such as: ? Ultrasound. This may be used to examine the ovaries and the lining of the  uterus (endometrium) for cysts. ? Blood tests. These may be used to check levels of sugar (glucose), female hormone (testosterone), and female hormones (estrogen and progesterone) in your blood. How is this treated? There is no cure for PCOS, but treatment can help to manage symptoms and prevent more health problems from developing. Treatment varies depending on:  Your symptoms.  Whether you want to have a baby or whether you need birth control (contraception). Treatment may include nutrition and lifestyle changes along with:  Progesterone hormone to start a menstrual period.  Birth control pills to help you have regular menstrual periods.  Medicines to make you ovulate, if you want to get pregnant.  Medicine to reduce excessive hair growth.  Surgery, in severe cases. This may involve making small holes in one or both of your ovaries. This decreases the amount of testosterone that your body produces. Follow these instructions at home:  Take over-the-counter and prescription medicines only as told by your health care provider.  Follow a healthy meal plan. This can help you reduce the effects of PCOS. ? Eat a healthy diet that includes lean proteins, complex carbohydrates, fresh fruits and vegetables, low-fat dairy products, and healthy fats. Make sure to eat enough fiber.  If you are overweight, lose weight as told by your health care provider. ? Losing 10% of your body weight may improve symptoms. ? Your health care provider can determine how much weight loss is best for you and can help you lose weight safely.  Keep all follow-up visits as told by your health care provider.   This is important. Contact a health care provider if:  Your symptoms do not get better with medicine.  You develop new symptoms. This information is not intended to replace advice given to you by your health care provider. Make sure you discuss any questions you have with your health care provider. Document  Released: 05/28/2004 Document Revised: 09/30/2015 Document Reviewed: 07/20/2015 Elsevier Interactive Patient Education  2019 [employer]  Diet for Polycystic Ovary Syndrome Polycystic ovary syndrome (PCOS) is a disorder of the chemicals (hormones) that regulate a woman's reproductive system, including monthly periods (menstruation). The condition causes important hormones to be out of balance. PCOS can:  Stop your periods or make them irregular.  Cause cysts to develop on your ovaries.  Make it difficult to get pregnant.  Stop your body from responding to the effects of insulin (insulin resistance). Insulin resistance can lead to obesity and diabetes. Changing what you eat can help you manage PCOS and improve your health. Following a balanced diet can help you lose weight and improve the way that your body uses insulin. What are tips for following this plan?  Follow a balanced diet for meals and snacks. Eat breakfast, lunch, dinner, and one or two snacks every day.  Include protein in each meal and snack.  Choose whole grains instead of products that are made with refined flour.  Eat a variety of foods.  Exercise regularly as told by your health care provider. Aim to do 30 or more minutes of exercise on most days of the week.  If you are overweight or obese: ? Pay attention to how many calories you eat. Cutting down on calories can help you lose weight. ? Work with your health care provider or a diet and nutrition specialist (dietitian) to figure out how many calories you need each day. What foods can I eat?  Fruits Include a variety of colors and types. All fruits are helpful for PCOS. Vegetables Include a variety of colors and types. All vegetables are helpful for PCOS. Grains Whole grains, such as whole wheat. Whole-grain breads, crackers, cereals, and pasta. Unsweetened oatmeal, bulgur, barley, quinoa, and brown rice. Tortillas made from corn or whole-wheat flour. Meats  and other proteins Low-fat (lean) proteins, such as fish, chicken, beans, eggs, and tofu. Dairy Low-fat dairy products, such as skim milk, cheese sticks, and yogurt. Beverages Low-fat or fat-free drinks, such as water, low-fat milk, sugar-free drinks, and small amounts of 100% fruit juice. Seasonings and condiments Ketchup. Mustard. Barbecue sauce. Relish. Low-fat or fat-free mayonnaise. Fats and oils Olive oil or canola oil. Walnuts and almonds. The items listed above may not be a complete list of recommended foods and beverages. Contact a dietitian for more options. What foods are not recommended? Foods that are high in calories or fat. Fried foods. Sweets. Products that are made from refined white flour, including white bread, pastries, white rice, and pasta. The items listed above may not be a complete list of foods and beverages to avoid. Contact a dietitian for more information. Summary  PCOS is a hormonal imbalance that affects a woman's reproductive system.  You can help to manage your PCOS by exercising regularly and eating a healthy, varied diet of vegetables, fruit, whole grains, low-fat (lean) protein, and low-fat dairy products.  Changing what you eat can improve the way that your body uses insulin, help your hormones reach normal levels, and help you lose weight. This information is not intended to replace advice given to you by your health  care provider. Make sure you discuss any questions you have with your health care provider. Document Released: 05/26/2015 Document Revised: 12/06/2016 Document Reviewed: 12/06/2016 Elsevier Interactive Patient Education  2019 [employer].  Iron Deficiency Anemia, Adult Iron-deficiency anemia is when you have a low amount of red blood cells or hemoglobin. This happens because you have too little iron in your body. Hemoglobin carries oxygen to parts of the body. Anemia can cause your body to not get enough oxygen. It may or may not cause  symptoms. Follow these instructions at home: Medicines  Take over-the-counter and prescription medicines only as told by your doctor. This includes iron pills (supplements) and vitamins.  If you cannot handle taking iron pills by mouth, ask your doctor about getting iron through: ? A vein (intravenously). ? A shot (injection) into a muscle.  Take iron pills when your stomach is empty. If you cannot handle this, take them with food.  Do not drink milk or take antacids at the same time as your iron pills.  To prevent trouble pooping (constipation), eat fiber or take medicine (stool softener) as told by your doctor. Eating and drinking   Talk with your doctor before changing the foods you eat. He or she may tell you to eat foods that have a lot of iron, such as: ? Liver. ? Lowfat (lean) beef. ? Breads and cereals that have iron added to them (fortified breads and cereals). ? Eggs. ? Dried fruit. ? Dark green, leafy vegetables.  Drink enough fluid to keep your pee (urine) clear or pale yellow.  Eat fresh fruits and vegetables that are high in vitamin C. They help your body to use iron. Foods with a lot of vitamin C include: ? Oranges. ? Peppers. ? Tomatoes. ? Mangoes. General instructions  Return to your normal activities as told by your doctor. Ask your doctor what activities are safe for you.  Keep yourself clean, and keep things clean around you (your surroundings). Anemia can make you get sick more easily.  Keep all follow-up visits as told by your doctor. This is important. Contact a doctor if:  You feel sick to your stomach (nauseous).  You throw up (vomit).  You feel weak.  You are sweating for no clear reason.  You have trouble pooping, such as: ? Pooping (having a bowel movement) less than 3 times a week. ? Straining to poop. ? Having poop that is hard, dry, or larger than normal. ? Feeling full or bloated. ? Pain in the lower belly. ? Not feeling better  after pooping. Get help right away if:  You pass out (faint). If this happens, do not drive yourself to the hospital. Call your local emergency services (911 in the [place]  You have chest pain.  You have shortness of breath that: ? Is very bad. ? Gets worse with physical activity.  You have a fast heartbeat.  You get light-headed when getting up from sitting or lying down. This information is not intended to replace advice given to you by your health care provider. Make sure you discuss any questions you have with your health care provider. Document Released: 03/06/2010 Document Revised: 10/22/2015 Document Reviewed: 10/22/2015 Elsevier Interactive Patient Education  2019 [employer] Inc.  Living With Anxiety  After being diagnosed with an anxiety disorder, you may be relieved to know why you have felt or behaved a certain way. It is natural to also feel overwhelmed about the treatment ahead and what it will mean for your  life. With care and support, you can manage this condition and recover from it. How to cope with anxiety Dealing with stress Stress is your body's reaction to life changes and events, both good and bad. Stress can last just a few hours or it can be ongoing. Stress can play a major role in anxiety, so it is important to learn both how to cope with stress and how to think about it differently. Talk with your health care provider or a counselor to learn more about stress reduction. He or she may suggest some stress reduction techniques, such as:  Music therapy. This can include creating or listening to music that you enjoy and that inspires you.  Mindfulness-based meditation. This involves being aware of your normal breaths, rather than trying to control your breathing. It can be done while sitting or walking.  Centering prayer. This is a kind of meditation that involves focusing on a word, phrase, or sacred image that is meaningful to you and that brings you peace.  Deep  breathing. To do this, expand your stomach and inhale slowly through your nose. Hold your breath for 3-5 seconds. Then exhale slowly, allowing your stomach muscles to relax.  Self-talk. This is a skill where you identify thought patterns that lead to anxiety reactions and correct those thoughts.  Muscle relaxation. This involves tensing muscles then relaxing them. Choose a stress reduction technique that fits your lifestyle and personality. Stress reduction techniques take time and practice. Set aside 5-15 minutes a day to do them. Therapists can offer training in these techniques. The training may be covered by some insurance plans. Other things you can do to manage stress include:  Keeping a stress diary. This can help you learn what triggers your stress and ways to control your response.  Thinking about how you respond to certain situations. You may not be able to control everything, but you can control your reaction.  Making time for activities that help you relax, and not feeling guilty about spending your time in this way. Therapy combined with coping and stress-reduction skills provides the best chance for successful treatment. Medicines Medicines can help ease symptoms. Medicines for anxiety include:  Anti-anxiety drugs.  Antidepressants.  Beta-blockers. Medicines may be used as the main treatment for anxiety disorder, along with therapy, or if other treatments are not working. Medicines should be prescribed by a health care provider. Relationships Relationships can play a big part in helping you recover. Try to spend more time connecting with trusted friends and family members. Consider going to couples counseling, taking family education classes, or going to family therapy. Therapy can help you and others better understand the condition. How to recognize changes in your condition Everyone has a different response to treatment for anxiety. Recovery from anxiety happens when symptoms  decrease and stop interfering with your daily activities at home or work. This may mean that you will start to:  Have better concentration and focus.  Sleep better.  Be less irritable.  Have more energy.  Have improved memory. It is important to recognize when your condition is getting worse. Contact your health care provider if your symptoms interfere with home or work and you do not feel like your condition is improving. Where to find help and support: You can get help and support from these sources:  Self-help groups.  Online and [employer] organizations.  A trusted spiritual leader.  Couples counseling.  Family education classes.  Family therapy. Follow these instructions at home:  Eat a healthy diet that includes plenty of vegetables, fruits, whole grains, low-fat dairy products, and lean protein. Do not eat a lot of foods that are high in solid fats, added sugars, or salt.  Exercise. Most adults should do the following: ? Exercise for at least 150 minutes each week. The exercise should increase your heart rate and make you sweat (moderate-intensity exercise). ? Strengthening exercises at least twice a week.  Cut down on caffeine, tobacco, alcohol, and other potentially harmful substances.  Get the right amount and quality of sleep. Most adults need 7-9 hours of sleep each night.  Make choices that simplify your life.  Take over-the-counter and prescription medicines only as told by your health care provider.  Avoid caffeine, alcohol, and certain over-the-counter cold medicines. These may make you feel worse. Ask your pharmacist which medicines to avoid.  Keep all follow-up visits as told by your health care provider. This is important. Questions to ask your health care provider  Would I benefit from therapy?  How often should I follow up with a health care provider?  How long do I need to take medicine?  Are there any long-term side effects of my  medicine?  Are there any alternatives to taking medicine? Contact a health care provider if:  You have a hard time staying focused or finishing daily tasks.  You spend many hours a day feeling worried about everyday life.  You become exhausted by worry.  You start to have headaches, feel tense, or have nausea.  You urinate more than normal.  You have diarrhea. Get help right away if:  You have a racing heart and shortness of breath.  You have thoughts of hurting yourself or others. If you ever feel like you may hurt yourself or others, or have thoughts about taking your own life, get help right away. You can go to your nearest emergency department or call:  Your local emergency services (911 in the [place]  A suicide crisis helpline, such as the National Suicide Prevention Lifeline at 805-566-8043. This is open 24-hours a day. Summary  Taking steps to deal with stress can help calm you.  Medicines cannot cure anxiety disorders, but they can help ease symptoms.  Family, friends, and partners can play a big part in helping you recover from an anxiety disorder. This information is not intended to replace advice given to you by your health care provider. Make sure you discuss any questions you have with your health care provider. Document Released: 01/27/2016 Document Revised: 01/27/2016 Document Reviewed: 01/27/2016 Elsevier Interactive Patient Education  2019 [employer].

## 2018-04-11 ENCOUNTER — Other Ambulatory Visit: Payer: Self-pay | Admitting: Urgent Care

## 2018-04-21 ENCOUNTER — Encounter: Payer: BLUE CROSS/BLUE SHIELD | Admitting: Family Medicine

## 2018-04-21 ENCOUNTER — Ambulatory Visit (INDEPENDENT_AMBULATORY_CARE_PROVIDER_SITE_OTHER): Payer: BLUE CROSS/BLUE SHIELD | Admitting: Family Medicine

## 2018-04-21 ENCOUNTER — Encounter: Payer: Self-pay | Admitting: Family Medicine

## 2018-04-21 VITALS — BP 108/70 | HR 76 | Temp 97.8°F | Wt 218.0 lb

## 2018-04-21 DIAGNOSIS — R5383 Other fatigue: Secondary | ICD-10-CM

## 2018-04-21 DIAGNOSIS — Z0000 Encounter for general adult medical examination without abnormal findings: Secondary | ICD-10-CM | POA: Diagnosis not present

## 2018-04-21 DIAGNOSIS — F32A Depression, unspecified: Secondary | ICD-10-CM

## 2018-04-21 DIAGNOSIS — E282 Polycystic ovarian syndrome: Secondary | ICD-10-CM | POA: Diagnosis not present

## 2018-04-21 DIAGNOSIS — F419 Anxiety disorder, unspecified: Secondary | ICD-10-CM | POA: Diagnosis not present

## 2018-04-21 DIAGNOSIS — Z13220 Encounter for screening for lipoid disorders: Secondary | ICD-10-CM

## 2018-04-21 DIAGNOSIS — F329 Major depressive disorder, single episode, unspecified: Secondary | ICD-10-CM

## 2018-04-21 LAB — BASIC METABOLIC PANEL
BUN: 12 mg/dL (ref 6–23)
CO2: 29 mEq/L (ref 19–32)
Calcium: 9.4 mg/dL (ref 8.4–10.5)
Chloride: 101 mEq/L (ref 96–112)
Creatinine, Ser: 0.54 mg/dL (ref 0.40–1.20)
GFR: 136.95 mL/min (ref >60.00)
Glucose, Bld: 70 mg/dL (ref 70–99)
Potassium: 4.1 mEq/L (ref 3.5–5.1)
Sodium: 137 mEq/L (ref 135–145)

## 2018-04-21 LAB — LIPID PANEL
CHOLESTEROL: 147 mg/dL (ref 0–200)
HDL: 57.3 mg/dL (ref >39.00)
LDL CALC: 71 mg/dL (ref 0–99)
NonHDL: 90.14
Total CHOL/HDL Ratio: 3
Triglycerides: 97 mg/dL (ref 0.0–149.0)
VLDL: 19.4 mg/dL (ref 0.0–40.0)

## 2018-04-21 LAB — CBC WITH DIFFERENTIAL/PLATELET
Basophils Absolute: 0 10*3/uL (ref 0.0–0.1)
Basophils Relative: 0.5 % (ref 0.0–3.0)
Eosinophils Absolute: 0.2 10*3/uL (ref 0.0–0.7)
Eosinophils Relative: 2.4 % (ref 0.0–5.0)
HCT: 38.6 % (ref 36.0–46.0)
Hemoglobin: 12.6 g/dL (ref 12.0–15.0)
Lymphocytes Relative: 40.4 % (ref 12.0–46.0)
Lymphs Abs: 3.6 10*3/uL (ref 0.7–4.0)
MCHC: 32.6 g/dL (ref 30.0–36.0)
MCV: 79.5 fl (ref 78.0–100.0)
Monocytes Absolute: 0.5 10*3/uL (ref 0.1–1.0)
Monocytes Relative: 6 % (ref 3.0–12.0)
Neutro Abs: 4.5 10*3/uL (ref 1.4–7.7)
Neutrophils Relative %: 50.7 % (ref 43.0–77.0)
Platelets: 376 10*3/uL (ref 150.0–400.0)
RBC: 4.86 Mil/uL (ref 3.87–5.11)
RDW: 17.9 % — ABNORMAL HIGH (ref 11.5–15.5)
WBC: 8.9 10*3/uL (ref 4.0–10.5)

## 2018-04-21 LAB — T4, FREE: Free T4: 0.8 ng/dL (ref 0.60–1.60)

## 2018-04-21 LAB — TSH: TSH: 1.05 u[IU]/mL (ref 0.35–4.50)

## 2018-04-21 LAB — HEMOGLOBIN A1C: Hgb A1c MFr Bld: 5.9 % (ref 4.6–6.5)
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
## 2018-04-21 NOTE — Patient Instructions (Signed)
Preventive Care 18-39 Years, Female Preventive care refers to lifestyle choices and visits with your health care provider that can promote health and wellness. What does preventive care include?   A yearly physical exam. This is also called an annual well check.  Dental exams once or twice a year.  Routine eye exams. Ask your health care provider how often you should have your eyes checked.  Personal lifestyle choices, including: ? Daily care of your teeth and gums. ? Regular physical activity. ? Eating a healthy diet. ? Avoiding tobacco and drug use. ? Limiting alcohol use. ? Practicing safe sex. ? Taking vitamin and mineral supplements as recommended by your health care provider. What happens during an annual well check? The services and screenings done by your health care provider during your annual well check will depend on your age, overall health, lifestyle risk factors, and family history of disease. Counseling Your health care provider may ask you questions about your:  Alcohol use.  Tobacco use.  Drug use.  Emotional well-being.  Home and relationship well-being.  Sexual activity.  Eating habits.  Work and work environment.  Method of birth control.  Menstrual cycle.  Pregnancy history. Screening You may have the following tests or measurements:  Height, weight, and BMI.  Diabetes screening. This is done by checking your blood sugar (glucose) after you have not eaten for a while (fasting).  Blood pressure.  Lipid and cholesterol levels. These may be checked every 5 years starting at age 20.  Skin check.  Hepatitis C blood test.  Hepatitis B blood test.  Sexually transmitted disease (STD) testing.  BRCA-related cancer screening. This may be done if you have a family history of breast, ovarian, tubal, or peritoneal cancers.  Pelvic exam and Pap test. This may be done every 3 years starting at age 21. Starting at age 30, this may be done every 5  years if you have a Pap test in combination with an HPV test. Discuss your test results, treatment options, and if necessary, the need for more tests with your health care provider. Vaccines Your health care provider may recommend certain vaccines, such as:  Influenza vaccine. This is recommended every year.  Tetanus, diphtheria, and acellular pertussis (Tdap, Td) vaccine. You may need a Td booster every 10 years.  Varicella vaccine. You may need this if you have not been vaccinated.  HPV vaccine. If you are 26 or younger, you may need three doses over 6 months.  Measles, mumps, and rubella (MMR) vaccine. You may need at least one dose of MMR. You may also need a second dose.  Pneumococcal 13-valent conjugate (PCV13) vaccine. You may need this if you have certain conditions and were not previously vaccinated.  Pneumococcal polysaccharide (PPSV23) vaccine. You may need one or two doses if you smoke cigarettes or if you have certain conditions.  Meningococcal vaccine. One dose is recommended if you are age 19-21 years and a first-year college student living in a residence hall, or if you have one of several medical conditions. You may also need additional booster doses.  Hepatitis A vaccine. You may need this if you have certain conditions or if you travel or work in places where you may be exposed to hepatitis A.  Hepatitis B vaccine. You may need this if you have certain conditions or if you travel or work in places where you may be exposed to hepatitis B.  Haemophilus influenzae type b (Hib) vaccine. You may need this if you   have certain risk factors. Talk to your health care provider about which screenings and vaccines you need and how often you need them. This information is not intended to replace advice given to you by your health care provider. Make sure you discuss any questions you have with your health care provider. Document Released: 03/30/2001 Document Revised: 09/14/2016  Document Reviewed: 12/03/2014 Elsevier Interactive Patient Education  2019 [place]  After being diagnosed with an anxiety disorder, you may be relieved to know why you have felt or behaved a certain way. It is natural to also feel overwhelmed about the treatment ahead and what it will mean for your life. With care and support, you can manage this condition and recover from it. How to cope with anxiety Dealing with stress Stress is your body's reaction to life changes and events, both good and bad. Stress can last just a few hours or it can be ongoing. Stress can play a major role in anxiety, so it is important to learn both how to cope with stress and how to think about it differently. Talk with your health care provider or a counselor to learn more about stress reduction. He or she may suggest some stress reduction techniques, such as:  Music therapy. This can include creating or listening to music that you enjoy and that inspires you.  Mindfulness-based meditation. This involves being aware of your normal breaths, rather than trying to control your breathing. It can be done while sitting or walking.  Centering prayer. This is a kind of meditation that involves focusing on a word, phrase, or sacred image that is meaningful to you and that brings you peace.  Deep breathing. To do this, expand your stomach and inhale slowly through your nose. Hold your breath for 3-5 seconds. Then exhale slowly, allowing your stomach muscles to relax.  Self-talk. This is a skill where you identify thought patterns that lead to anxiety reactions and correct those thoughts.  Muscle relaxation. This involves tensing muscles then relaxing them. Choose a stress reduction technique that fits your lifestyle and personality. Stress reduction techniques take time and practice. Set aside 5-15 minutes a day to do them. Therapists can offer training in these techniques. The training may be  covered by some insurance plans. Other things you can do to manage stress include:  Keeping a stress diary. This can help you learn what triggers your stress and ways to control your response.  Thinking about how you respond to certain situations. You may not be able to control everything, but you can control your reaction.  Making time for activities that help you relax, and not feeling guilty about spending your time in this way. Therapy combined with coping and stress-reduction skills provides the best chance for successful treatment. Medicines Medicines can help ease symptoms. Medicines for anxiety include:  Anti-anxiety drugs.  Antidepressants.  Beta-blockers. Medicines may be used as the main treatment for anxiety disorder, along with therapy, or if other treatments are not working. Medicines should be prescribed by a health care provider. Relationships Relationships can play a big part in helping you recover. Try to spend more time connecting with trusted friends and family members. Consider going to couples counseling, taking family education classes, or going to family therapy. Therapy can help you and others better understand the condition. How to recognize changes in your condition Everyone has a different response to treatment for anxiety. Recovery from anxiety happens when symptoms decrease and stop interfering with  your daily activities at home or work. This may mean that you will start to:  Have better concentration and focus.  Sleep better.  Be less irritable.  Have more energy.  Have improved memory. It is important to recognize when your condition is getting worse. Contact your health care provider if your symptoms interfere with home or work and you do not feel like your condition is improving. Where to find help and support: You can get help and support from these sources:  Self-help groups.  Online and [employer].  A trusted spiritual  leader.  Couples counseling.  Family education classes.  Family therapy. Follow these instructions at home:  Eat a healthy diet that includes plenty of vegetables, fruits, whole grains, low-fat dairy products, and lean protein. Do not eat a lot of foods that are high in solid fats, added sugars, or salt.  Exercise. Most adults should do the following: ? Exercise for at least 150 minutes each week. The exercise should increase your heart rate and make you sweat (moderate-intensity exercise). ? Strengthening exercises at least twice a week.  Cut down on caffeine, tobacco, alcohol, and other potentially harmful substances.  Get the right amount and quality of sleep. Most adults need 7-9 hours of sleep each night.  Make choices that simplify your life.  Take over-the-counter and prescription medicines only as told by your health care provider.  Avoid caffeine, alcohol, and certain over-the-counter cold medicines. These may make you feel worse. Ask your pharmacist which medicines to avoid.  Keep all follow-up visits as told by your health care provider. This is important. Questions to ask your health care provider  Would I benefit from therapy?  How often should I follow up with a health care provider?  How long do I need to take medicine?  Are there any long-term side effects of my medicine?  Are there any alternatives to taking medicine? Contact a health care provider if:  You have a hard time staying focused or finishing daily tasks.  You spend many hours a day feeling worried about everyday life.  You become exhausted by worry.  You start to have headaches, feel tense, or have nausea.  You urinate more than normal.  You have diarrhea. Get help right away if:  You have a racing heart and shortness of breath.  You have thoughts of hurting yourself or others. If you ever feel like you may hurt yourself or others, or have thoughts about taking your own life, get help  right away. You can go to your nearest emergency department or call:  Your local emergency services (911 in the [place]  A suicide crisis helpline, such as the [facility] at 680-584-0272. This is open 24-hours a day. Summary  Taking steps to deal with stress can help calm you.  Medicines cannot cure anxiety disorders, but they can help ease symptoms.  Family, friends, and partners can play a big part in helping you recover from an anxiety disorder. This information is not intended to replace advice given to you by your health care provider. Make sure you discuss any questions you have with your health care provider. Document Released: 01/27/2016 Document Revised: 01/27/2016 Document Reviewed: 01/27/2016 Elsevier Interactive Patient Education  2019 [place] With Depression Everyone experiences occasional disappointment, sadness, and loss in their lives. When you are feeling down, blue, or sad for at least 2 weeks in a row, it may mean that you have depression. Depression can affect  your thoughts and feelings, relationships, daily activities, and physical health. It is caused by changes in the way your brain functions. If you receive a diagnosis of depression, your health care provider will tell you which type of depression you have and what treatment options are available to you. If you are living with depression, there are ways to help you recover from it and also ways to prevent it from coming back. How to cope with lifestyle changes Coping with stress     Stress is your body's reaction to life changes and events, both good and bad. Stressful situations may include:  Getting married.  The death of a spouse.  Losing a job.  Retiring.  Having a baby. Stress can last just a few hours or it can be ongoing. Stress can play a major role in depression, so it is important to learn both how to cope with stress and how to think about it  differently. Talk with your health care provider or a counselor if you would like to learn more about stress reduction. He or she may suggest some stress reduction techniques, such as:  Music therapy. This can include creating music or listening to music. Choose music that you enjoy and that inspires you.  Mindfulness-based meditation. This kind of meditation can be done while sitting or walking. It involves being aware of your normal breaths, rather than trying to control your breathing.  Centering prayer. This is a kind of meditation that involves focusing on a spiritual word or phrase. Choose a word, phrase, or sacred image that is meaningful to you and that brings you peace.  Deep breathing. To do this, expand your stomach and inhale slowly through your nose. Hold your breath for 3-5 seconds, then exhale slowly, allowing your stomach muscles to relax.  Muscle relaxation. This involves intentionally tensing muscles then relaxing them. Choose a stress reduction technique that fits your lifestyle and personality. Stress reduction techniques take time and practice to develop. Set aside 5-15 minutes a day to do them. Therapists can offer training in these techniques. The training may be covered by some insurance plans. Other things you can do to manage stress include:  Keeping a stress diary. This can help you learn what triggers your stress and ways to control your response.  Understanding what your limits are and saying no to requests or events that lead to a schedule that is too full.  Thinking about how you respond to certain situations. You may not be able to control everything, but you can control how you react.  Adding humor to your life by watching funny films or TV shows.  Making time for activities that help you relax and not feeling guilty about spending your time this way.  Medicines Your health care provider may suggest certain medicines if he or she feels that they will help  improve your condition. Avoid using alcohol and other substances that may prevent your medicines from working properly (may interact). It is also important to:  Talk with your pharmacist or health care provider about all the medicines that you take, their possible side effects, and what medicines are safe to take together.  Make it your goal to take part in all treatment decisions (shared decision-making). This includes giving input on the side effects of medicines. It is best if shared decision-making with your health care provider is part of your total treatment plan. If your health care provider prescribes a medicine, you may not notice the full benefits  of it for 4-8 weeks. Most people who are treated for depression need to be on medicine for at least 6-12 months after they feel better. If you are taking medicines as part of your treatment, do not stop taking medicines without first talking to your health care provider. You may need to have the medicine slowly decreased (tapered) over time to decrease the risk of harmful side effects. Relationships Your health care provider may suggest family therapy along with individual therapy and drug therapy. While there may not be family problems that are causing you to feel depressed, it is still important to make sure your family learns as much as they can about your mental health. Having your family's support can help make your treatment successful. How to recognize changes in your condition Everyone has a different response to treatment for depression. Recovery from major depression happens when you have not had signs of major depression for two months. This may mean that you will start to:  Have more interest in doing activities.  Feel less hopeless than you did 2 months ago.  Have more energy.  Overeat less often, or have better or improving appetite.  Have better concentration. Your health care provider will work with you to decide the next steps  in your recovery. It is also important to recognize when your condition is getting worse. Watch for these signs:  Having fatigue or low energy.  Eating too much or too little.  Sleeping too much or too little.  Feeling restless, agitated, or hopeless.  Having trouble concentrating or making decisions.  Having unexplained physical complaints.  Feeling irritable, angry, or aggressive. Get help as soon as you or your family members notice these symptoms coming back. How to get support and help from others How to talk with friends and family members about your condition  Talking to friends and family members about your condition can provide you with one way to get support and guidance. Reach out to trusted friends or family members, explain your symptoms to them, and let them know that you are working with a health care provider to treat your depression. Financial resources Not all insurance plans cover mental health care, so it is important to check with your insurance carrier. If paying for co-pays or counseling services is a problem, search for a local or county mental health care center. They may be able to offer public mental health care services at low or no cost when you are not able to see a private health care provider. If you are taking medicine for depression, you may be able to get the generic form, which may be less expensive. Some makers of prescription medicines also offer help to patients who cannot afford the medicines they need. Follow these instructions at home:   Get the right amount and quality of sleep.  Cut down on using caffeine, tobacco, alcohol, and other potentially harmful substances.  Try to exercise, such as walking or lifting small weights.  Take over-the-counter and prescription medicines only as told by your health care provider.  Eat a healthy diet that includes plenty of vegetables, fruits, whole grains, low-fat dairy products, and lean protein. Do not  eat a lot of foods that are high in solid fats, added sugars, or salt.  Keep all follow-up visits as told by your health care provider. This is important. Contact a health care provider if:  You stop taking your antidepressant medicines, and you have any of these symptoms: ? Nausea. ?  Headache. ? Feeling lightheaded. ? Chills and body aches. ? Not being able to sleep (insomnia).  You or your friends and family think your depression is getting worse. Get help right away if:  You have thoughts of hurting yourself or others. If you ever feel like you may hurt yourself or others, or have thoughts about taking your own life, get help right away. You can go to your nearest emergency department or call:  Your local emergency services (911 in the [place]  A suicide crisis helpline, such as the [facility] at (978)459-6141. This is open 24-hours a day. Summary  If you are living with depression, there are ways to help you recover from it and also ways to prevent it from coming back.  Work with your health care team to create a management plan that includes counseling, stress management techniques, and healthy lifestyle habits. This information is not intended to replace advice given to you by your health care provider. Make sure you discuss any questions you have with your health care provider. Document Released: 01/05/2016 Document Revised: 01/05/2016 Document Reviewed: 01/05/2016 Elsevier Interactive Patient Education  2019 [place].  Diet for Polycystic Ovary Syndrome Polycystic ovary syndrome (PCOS) is a disorder of the chemicals (hormones) that regulate a woman's reproductive system, including monthly periods (menstruation). The condition causes important hormones to be out of balance. PCOS can:  Stop your periods or make them irregular.  Cause cysts to develop on your ovaries.  Make it difficult to get pregnant.  Stop your body from responding to the  effects of insulin (insulin resistance). Insulin resistance can lead to obesity and diabetes. Changing what you eat can help you manage PCOS and improve your health. Following a balanced diet can help you lose weight and improve the way that your body uses insulin. What are tips for following this plan?  Follow a balanced diet for meals and snacks. Eat breakfast, lunch, dinner, and one or two snacks every day.  Include protein in each meal and snack.  Choose whole grains instead of products that are made with refined flour.  Eat a variety of foods.  Exercise regularly as told by your health care provider. Aim to do 30 or more minutes of exercise on most days of the week.  If you are overweight or obese: ? Pay attention to how many calories you eat. Cutting down on calories can help you lose weight. ? Work with your health care provider or a diet and nutrition specialist (dietitian) to figure out how many calories you need each day. What foods can I eat?  Fruits Include a variety of colors and types. All fruits are helpful for PCOS. Vegetables Include a variety of colors and types. All vegetables are helpful for PCOS. Grains Whole grains, such as whole wheat. Whole-grain breads, crackers, cereals, and pasta. Unsweetened oatmeal, bulgur, barley, quinoa, and brown rice. Tortillas made from corn or whole-wheat flour. Meats and other proteins Low-fat (lean) proteins, such as fish, chicken, beans, eggs, and tofu. Dairy Low-fat dairy products, such as skim milk, cheese sticks, and yogurt. Beverages Low-fat or fat-free drinks, such as water, low-fat milk, sugar-free drinks, and small amounts of 100% fruit juice. Seasonings and condiments Ketchup. Mustard. Barbecue sauce. Relish. Low-fat or fat-free mayonnaise. Fats and oils Olive oil or canola oil. Walnuts and almonds. The items listed above may not be a complete list of recommended foods and beverages. Contact a dietitian for more  options. What foods are not recommended?  Foods that are high in calories or fat. Fried foods. Sweets. Products that are made from refined white flour, including white bread, pastries, white rice, and pasta. The items listed above may not be a complete list of foods and beverages to avoid. Contact a dietitian for more information. Summary  PCOS is a hormonal imbalance that affects a woman's reproductive system.  You can help to manage your PCOS by exercising regularly and eating a healthy, varied diet of vegetables, fruit, whole grains, low-fat (lean) protein, and low-fat dairy products.  Changing what you eat can improve the way that your body uses insulin, help your hormones reach normal levels, and help you lose weight. This information is not intended to replace advice given to you by your health care provider. Make sure you discuss any questions you have with your health care provider. Document Released: 05/26/2015 Document Revised: 12/06/2016 Document Reviewed: 12/06/2016 Elsevier Interactive Patient Education  2019 [employer].

## 2018-05-06 ENCOUNTER — Emergency Department (HOSPITAL_COMMUNITY)
Admission: EM | Admit: 2018-05-06 | Discharge: 2018-05-07 | Disposition: A | Discharge status: Home / Self Care | Payer: BLUE CROSS/BLUE SHIELD | Attending: Emergency Medicine | Admitting: Emergency Medicine

## 2018-05-06 ENCOUNTER — Emergency Department (HOSPITAL_COMMUNITY): Payer: BLUE CROSS/BLUE SHIELD

## 2018-05-06 ENCOUNTER — Encounter (HOSPITAL_COMMUNITY): Payer: Self-pay

## 2018-05-06 ENCOUNTER — Other Ambulatory Visit: Payer: Self-pay

## 2018-05-06 DIAGNOSIS — W501XXA Accidental kick by another person, initial encounter: Secondary | ICD-10-CM | POA: Insufficient documentation

## 2018-05-06 DIAGNOSIS — Z87891 Personal history of nicotine dependence: Secondary | ICD-10-CM | POA: Insufficient documentation

## 2018-05-06 DIAGNOSIS — S6992XA Unspecified injury of left wrist, hand and finger(s), initial encounter: Secondary | ICD-10-CM | POA: Insufficient documentation

## 2018-05-06 DIAGNOSIS — M79646 Pain in unspecified finger(s): Secondary | ICD-10-CM | POA: Diagnosis not present

## 2018-05-06 DIAGNOSIS — Y998 Other external cause status: Secondary | ICD-10-CM | POA: Insufficient documentation

## 2018-05-06 DIAGNOSIS — E119 Type 2 diabetes mellitus without complications: Secondary | ICD-10-CM | POA: Diagnosis not present

## 2018-05-06 DIAGNOSIS — Y929 Unspecified place or not applicable: Secondary | ICD-10-CM | POA: Insufficient documentation

## 2018-05-06 DIAGNOSIS — S60947A Unspecified superficial injury of left little finger, initial encounter: Secondary | ICD-10-CM | POA: Diagnosis not present

## 2018-05-06 DIAGNOSIS — Y9383 Activity, rough housing and horseplay: Secondary | ICD-10-CM | POA: Diagnosis not present

## 2018-05-06 DIAGNOSIS — I10 Essential (primary) hypertension: Secondary | ICD-10-CM | POA: Diagnosis not present

## 2018-05-06 MED ORDER — IBUPROFEN 800 MG PO TABS
800.0000 mg | ORAL_TABLET | Freq: Once | ORAL | Status: AC
  Administered 2018-05-06: 800 mg via ORAL
  Filled 2018-05-06: qty 1
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
## 2018-05-07 NOTE — Discharge Instructions (Addendum)
1. Medications: alternate naprosyn and tylenol for pain control, usual home medications 2. Treatment: rest, ice, elevate and use splint, drink plenty of fluids, gentle stretching of the finger 3. Follow Up: Please followup with your PCP in 1 week or hand surgery if no improvement; Please return to the ER for worsening symptoms, new deformity, numbness or other concerns

## 2018-05-07 NOTE — ED Provider Notes (Signed)
[facility] EMERGENCY DEPARTMENT Provider Note   CSN: [id number] Arrival date & time: 05/06/18  2252    History   Chief Complaint Chief Complaint  Patient presents with  . Finger Injury    HPI Darren Cassee Thornes is a 28 y.o. female with a hx of NIDDM presents to the Emergency Department complaining of acute, persistent, left little finger pain onset approx PTA.  Pt reports she was play fighting with her husband when he accidentally kicked her in the left hand.  She reports that her left little finger bent at a 90 degree angle leftward and then back into place.  She reports since that time she has had swelling and ecchymosis at the PIP with decreased range of motion.  She denies numbness, pain in her hand or wrist.  No pain anywhere else.  Palpation make her symptoms worse.  Nothing seems to make them better.      The history is provided by the patient and medical records. No language interpreter was used.    Past Medical History:  Diagnosis Date  . Depression   . Diabetes mellitus without complication (HCC)   . Hypertension   . PCOS (polycystic ovarian syndrome)     Patient Active Problem List   Diagnosis Date Noted  . PCOS (polycystic ovarian syndrome) 04/07/2018  . Anxiety and depression 04/07/2018  . Iron deficiency anemia 04/07/2018  . Abnormal uterine bleeding (AUB) 01/21/2017    History reviewed. No pertinent surgical history.   OB History   No obstetric history on file.      Home Medications    Prior to Admission medications   Not on File    Family History Family History  Problem Relation Age of Onset  . Diabetes Mother   . Hypertension Mother     Social History Social History   Tobacco Use  . Smoking status: Former Smoker    Types: Cigarettes    Last attempt to quit: 12/22/2016    Years since quitting: 1.3  . Smokeless tobacco: Never Used  Substance Use Topics  . Alcohol use: No  . Drug use: No      Allergies   Patient has no known allergies.   Review of Systems Review of Systems  Constitutional: Negative for chills and fever.  Gastrointestinal: Negative for nausea and vomiting.  Musculoskeletal: Positive for arthralgias and joint swelling. Negative for back pain, neck pain and neck stiffness.  Skin: Negative for wound.  Neurological: Negative for numbness.  Hematological: Does not bruise/bleed easily.  Psychiatric/Behavioral: The patient is not nervous/anxious.   All other systems reviewed and are negative.    Physical Exam Updated Vital Signs BP (!) 151/82 (BP Location: Right Arm)   Pulse 91   Temp 97.9 F (36.6 C) (Oral)   Resp 16   Ht 5' 1.5" (1.562 m)   Wt 98 kg   SpO2 98%   BMI 40.15 kg/m   Physical Exam Vitals signs and nursing note reviewed.  Constitutional:      General: She is not in acute distress.    Appearance: She is well-developed. She is not diaphoretic.  HENT:     Head: Normocephalic and atraumatic.  Eyes:     Conjunctiva/sclera: Conjunctivae normal.  Neck:     Musculoskeletal: Normal range of motion.  Cardiovascular:     Rate and Rhythm: Normal rate and regular rhythm.     Comments: Capillary refill < 3 sec Pulmonary:     Effort: Pulmonary effort  is normal.     Breath sounds: Normal breath sounds.  Musculoskeletal:        General: Tenderness present.     Comments: Full range of motion of the thumb, pointer, middle and ring fingers of the left hand.  5/5 strength and normal sensation in all of these fingers. Decreased range of motion but some flexion and full extension of the DIP of the left little finger, minimal range of motion of the PIP due to swelling and pain.  Ecchymosis noted at the PIP. Full range of motion of the MCP.  No tenderness along with the metacarpal or wrist.  Full range of motion of the wrist.  Skin:    General: Skin is warm and dry.     Comments: No tenting of the skin  Neurological:     Mental Status: She is alert.      Coordination: Coordination normal.     Comments: Sensation intact to normal touch throughout all fingers of the left hand including the left little finger Strength 5/5 with lection extension of the DIP, PIP and MCPs of all fingers except the little finger.  5/5 strength of the MCP of the little finger.  4/5 strength of the DIP.  Unable to test strength of the PIP as patient has limited range of motion and significant pain with palpation.      ED Treatments / Results   Radiology Dg Finger Little Left  Result Date: 05/06/2018 CLINICAL DATA:  Pain in the left fifth proximal interphalangeal joint. EXAM: LEFT LITTLE FINGER 2+V COMPARISON:  None. FINDINGS: There is no evidence of fracture or dislocation. There is no evidence of arthropathy or other focal bone abnormality. Mild diffuse soft tissue swelling of the left fifth digit. IMPRESSION: No acute fracture or dislocation identified about the left fifth digit. Electronically Signed   By: Ted Mcalpine M.D.   On: 05/06/2018 23:29    Procedures Procedures (including critical care time)  Medications Ordered in ED Medications  ibuprofen (ADVIL,MOTRIN) tablet 800 mg (800 mg Oral Given 05/06/18 2330)     Initial Impression / Assessment and Plan / ED Course  I have reviewed the triage vital signs and the nursing notes.  Pertinent labs & imaging results that were available during my care of the patient were reviewed by me and considered in my medical decision making (see chart for details).        Patient X-Ray negative for obvious fracture or dislocation.  Personally evaluated these images.  No obvious deformity of the left little finger.  No persistent flexion or extension to suggest ligamentous rupture.  Pain managed in ED. Pt advised to follow up with primary care or hand surgery if symptoms persist or worsen. Patient given your splint while in ED, conservative therapy recommended and discussed. Patient will be dc home & is agreeable  with above plan.   Final Clinical Impressions(s) / ED Diagnoses   Final diagnoses:  Injury of finger of left hand, initial encounter    ED Discharge Orders    None       Mardene Sayer Boyd Kerbs 05/07/18 0030    Long, Arlyss Repress, MD 05/07/18 1038

## 2018-05-12 ENCOUNTER — Ambulatory Visit (INDEPENDENT_AMBULATORY_CARE_PROVIDER_SITE_OTHER): Payer: BLUE CROSS/BLUE SHIELD | Admitting: Family Medicine

## 2018-05-12 ENCOUNTER — Encounter: Payer: Self-pay | Admitting: Family Medicine

## 2018-05-12 ENCOUNTER — Other Ambulatory Visit: Payer: Self-pay

## 2018-05-12 DIAGNOSIS — F329 Major depressive disorder, single episode, unspecified: Secondary | ICD-10-CM | POA: Diagnosis not present

## 2018-05-12 DIAGNOSIS — F32A Depression, unspecified: Secondary | ICD-10-CM

## 2018-05-12 DIAGNOSIS — S63617D Unspecified sprain of left little finger, subsequent encounter: Secondary | ICD-10-CM

## 2018-05-12 DIAGNOSIS — L918 Other hypertrophic disorders of the skin: Secondary | ICD-10-CM

## 2018-05-12 DIAGNOSIS — F419 Anxiety disorder, unspecified: Secondary | ICD-10-CM

## 2018-05-12 NOTE — Progress Notes (Signed)
Virtual Visit via Video Note  I connected with Chloe Cervantes on 05/12/18 at  2:00 PM EDT by a video enabled telemedicine application and verified that I am speaking with the correct person using two identifiers.  Location patient: home Location provider:clinic office Persons participating in the virtual visit: patient, provider.  Pt's two daughters seen intermittently on the screen during visit.  I discussed the limitations of evaluation and management by telemedicine and the availability of in person appointments. The patient expressed understanding and agreed to proceed.   HPI: Pt went to the ED on 05/06/2018 after injuring her left fifth digit wrestling with her husband.  X-ray without fracture or dislocation.  Patient endorses finger edema and mild discomfort since the incident.  Patient has been wearing finger splint while at work but remove splint at home.  Patient also expressed concerns of splint being wrapped too tight as her finger felt like it was pulsating.  Patient has not been using ice.  Patient denies discoloration of nail bed, ecchymosis changes in sensation.   Notes increased anxiety and depression.  States less worried about COVID 19.  Pt concerned her husband doesn't find her attractive, though they want to have another child.  Pt tries to start fights with her husband to avoid having sex.  Patient considered going to counseling but has yet to make an appointment.  Patient asked for information regarding appointment scheduling.  Pt concerned she is not ready to deal with some things from her past.  Notes skin tags on neck and b/l axilla.  Accidentally cut one while shaving.  Also with darker skin on neck.  Pt started taking PNVs.    ROS: See pertinent positives and negatives per HPI.  Past Medical History:  Diagnosis Date  . Depression   . Diabetes mellitus without complication (HCC)   . Hypertension   . PCOS (polycystic ovarian syndrome)     No past surgical history  on file.  Family History  Problem Relation Age of Onset  . Diabetes Mother   . Hypertension Mother     SOCIAL HX: working in health care.  Married to [employer].  Has two daughters.   No current outpatient medications on file.  PNVs  EXAM:  VITALS per patient if applicable:  GENERAL: alert, oriented, appears well and in no acute distress  HEENT: atraumatic, conjunttiva clear, no obvious abnormalities on inspection of external nose and ears  NECK: normal movements of the head and neck  LUNGS: on inspection no signs of respiratory distress, breathing rate appears normal, no obvious gross SOB, gasping or wheezing  CV: no obvious cyanosis  MS: L 5th digit with mild edema, no deformity, unable to fully flex.  Put splint with wrap on 5th digit while provider on video call.  Moves all other visible extremities without noticeable abnormality  SKIN: skin tags noted.  Acanthosis nigricans.  PSYCH/NEURO: pleasant and cooperative, mild depression, no obvious anxiety, speech and thought processing grossly intact  ASSESSMENT AND PLAN:  Discussed the following assessment and plan:  Sprain of left little finger, unspecified site of finger, subsequent encounter -xray results reviewed -pt advised to wear splint and ice finger regularly -Discussed follow-up in the next wk or two -Okay to take NSAIDs PRN for any pain/discomfort. -consider referral to sports med for continued symptoms  Skin tag -discussed removal if desired -will f/u in clinic  Anxiety and depression -stable -discussed scheduling appointment with [facility].  Given info for in office counselor, Maggie Font, [facility] -  given precautions    I discussed the assessment and treatment plan with the patient. The patient was provided an opportunity to ask questions and all were answered. The patient agreed with the plan and demonstrated an understanding of the instructions.   The patient was advised to call back or seek an in-person  evaluation if the symptoms worsen or if the condition fails to improve as anticipated.  I provided 15 minutes of non-face-to-face time during this encounter.   Deeann Saint, MD

## 2018-05-12 NOTE — Progress Notes (Signed)
Attempted to have visit via webex, but pt never connected.

## 2018-06-02 ENCOUNTER — Ambulatory Visit: Payer: BLUE CROSS/BLUE SHIELD | Admitting: Psychology

## 2018-06-16 ENCOUNTER — Other Ambulatory Visit: Payer: Self-pay

## 2018-06-16 ENCOUNTER — Ambulatory Visit (INDEPENDENT_AMBULATORY_CARE_PROVIDER_SITE_OTHER): Payer: BLUE CROSS/BLUE SHIELD | Admitting: Family Medicine

## 2018-06-16 ENCOUNTER — Encounter: Payer: Self-pay | Admitting: Family Medicine

## 2018-06-16 DIAGNOSIS — E282 Polycystic ovarian syndrome: Secondary | ICD-10-CM

## 2018-06-16 DIAGNOSIS — N939 Abnormal uterine and vaginal bleeding, unspecified: Secondary | ICD-10-CM | POA: Diagnosis not present

## 2018-06-16 LAB — CBC
HCT: 37.3 % (ref 36.0–46.0)
Hemoglobin: 12.5 g/dL (ref 12.0–15.0)
MCHC: 33.6 g/dL (ref 30.0–36.0)
MCV: 82.1 fl (ref 78.0–100.0)
Platelets: 371 10*3/uL (ref 150.0–400.0)
RBC: 4.54 Mil/uL (ref 3.87–5.11)
RDW: 14.6 % (ref 11.5–15.5)
WBC: 7.4 10*3/uL (ref 4.0–10.5)

## 2018-06-16 LAB — HCG, QUANTITATIVE, PREGNANCY: Quantitative HCG: <0.6 m[IU]/mL

## 2018-06-16 LAB — FERRITIN: Ferritin: 9.2 ng/mL — ABNORMAL LOW (ref 10.0–291.0)

## 2018-06-16 NOTE — Progress Notes (Signed)
Virtual Visit via Video Note  I connected with Chloe Cervantes on 06/16/18 at 11:30 AM EDT by a video enabled telemedicine application and verified that I am speaking with the correct person using two identifiers.  Location patient: home Location provider:work or home office Persons participating in the virtual visit: patient, provider  I discussed the limitations of evaluation and management by telemedicine and the availability of in person appointments. The patient expressed understanding and agreed to proceed.   HPI: Pt is a 28 yo female with pmh sig for PCOS, h/o anemia, anxiety and depression.  Pt with "pregnancy symptoms"-nausea, hyperpigmentation of areola, spotting, mild abdominal cramping, fatigue, cravings that started last month.  Pregnancy test negative.   A few wks later, 2 other test still negative.  Pt now with heavier bleeding off and on x 3 wks.  Having golf ball sized clots. Changing pad every hour or two.  Feels tired but it is her normal tired.   Endorses a mild sharp stabbing pain in R lower abd that comes and goes. Taking PNV.  Saw OB/Gyn in Oct 209, was put on progesterone.  Denies fever, chills, cough, vaginal d/c, dizziness.  Pt does mention she is interested in finding a female OB/Gyn provider.    ROS: See pertinent positives and negatives per HPI.  Past Medical History:  Diagnosis Date  . Depression   . Diabetes mellitus without complication (HCC)   . Hypertension   . PCOS (polycystic ovarian syndrome)     No past surgical history on file.  Family History  Problem Relation Age of Onset  . Diabetes Mother   . Hypertension Mother     SOCIAL HX: Has 2 girls.  No current outpatient medications on file.  EXAM:  VITALS per patient if applicable:  RR between 12-20 bpm  GENERAL: alert, oriented, appears well and in no acute distress  HEENT: atraumatic, conjunctiva clear, no obvious abnormalities on inspection of external nose and ears  NECK: normal  movements of the head and neck  LUNGS: on inspection no signs of respiratory distress, breathing rate appears normal, no obvious gross SOB, gasping or wheezing  CV: no obvious cyanosis  MS: moves all visible extremities without noticeable abnormality  PSYCH/NEURO: pleasant and cooperative, no obvious depression or anxiety, speech and thought processing grossly intact  ASSESSMENT AND PLAN:  Discussed the following assessment and plan:  Abnormal uterine bleeding (AUB)  -pt encouraged to reach out to her OB/Gyn office in regards to ongoing symptoms -will obtain labs and imaging to r/o ectopic  -given precautions for increased bleeding, dizziness, fever, worsening symptoms, etc. - Plan: CBC (no diff), HCG, Quant, Pregnancy, US Transvaginal Non-OB, Ferritin  PCOS (polycystic ovarian syndrome)  - Plan: US Transvaginal Non-OB  F/u prn   I discussed the assessment and treatment plan with the patient. The patient was provided an opportunity to ask questions and all were answered. The patient agreed with the plan and demonstrated an understanding of the instructions.   The patient was advised to call back or seek an in-person evaluation if the symptoms worsen or if the condition fails to improve as anticipated.   Deeann Saint, MD

## 2018-06-19 ENCOUNTER — Telehealth: Payer: Self-pay | Admitting: *Deleted

## 2018-06-19 NOTE — Telephone Encounter (Signed)
Lab results given to pt husband listed on pt DPR, Verbalized understanding

## 2018-06-19 NOTE — Telephone Encounter (Signed)
Pt called in to speak with Harriett Sine. Pt says that she has questions about her results.    CB: [phone]

## 2018-06-19 NOTE — Telephone Encounter (Signed)
Copied from CRM [id number]. Topic: General - Inquiry >> Jun 19, 2018 11:11 AM Reggie Pile, NT wrote: Reason for CRM: Patient's husband is calling in requesting for lab results to be disclosed due to wife not hearing back or getting them. Patient's husband is on the [facility]. Call back is [phone].

## 2018-06-20 ENCOUNTER — Telehealth: Payer: Self-pay | Admitting: *Deleted

## 2018-06-20 NOTE — Telephone Encounter (Signed)
Copied from CRM [id number]. Topic: General - Inquiry >> Jun 19, 2018 11:11 AM Reggie Pile, NT wrote: Reason for CRM: Patient's husband is calling in requesting for lab results to be disclosed due to wife not hearing back or getting them. Patient's husband is on the [facility]. Call back is [phone]. >> Jun 20, 2018  1:56 PM Marylen Ponto wrote: Pt stated she requested a return call from the nurse regarding questions she has about her lab results but she has not heard back from anyone. Pt requests a call back. Cb# [phone]

## 2018-06-21 NOTE — Telephone Encounter (Signed)
Spoke with pt verbalized understanding of her lab results and her concerns

## 2018-06-21 NOTE — Telephone Encounter (Signed)
This note was resolved

## 2018-06-30 ENCOUNTER — Ambulatory Visit
Admission: RE | Admit: 2018-06-30 | Discharge: 2018-06-30 | Disposition: A | Discharge status: Home / Self Care | Payer: BLUE CROSS/BLUE SHIELD | Source: Ambulatory Visit | Attending: Family Medicine | Admitting: Family Medicine

## 2018-06-30 DIAGNOSIS — N939 Abnormal uterine and vaginal bleeding, unspecified: Secondary | ICD-10-CM

## 2018-06-30 DIAGNOSIS — E282 Polycystic ovarian syndrome: Secondary | ICD-10-CM

## 2018-07-06 ENCOUNTER — Telehealth: Payer: Self-pay | Admitting: *Deleted

## 2018-07-06 ENCOUNTER — Other Ambulatory Visit: Payer: Self-pay | Admitting: Family Medicine

## 2018-07-06 DIAGNOSIS — N939 Abnormal uterine and vaginal bleeding, unspecified: Secondary | ICD-10-CM

## 2018-07-06 NOTE — Telephone Encounter (Signed)
Copied from CRM [id number]. Topic: General - Inquiry >> Jul 05, 2018  3:39 PM Deborha Payment wrote: Reason for CRM: Patient is calling to get the results of her ultrasound.  Patient call back # [phone]

## 2018-07-06 NOTE — Telephone Encounter (Signed)
Pt given U/S results verbalized understanding

## 2018-07-11 ENCOUNTER — Encounter: Payer: Self-pay | Admitting: Family Medicine

## 2018-07-13 ENCOUNTER — Encounter (HOSPITAL_COMMUNITY): Payer: Self-pay

## 2018-07-13 ENCOUNTER — Telehealth: Payer: Self-pay

## 2018-07-13 ENCOUNTER — Other Ambulatory Visit: Payer: Self-pay

## 2018-07-13 ENCOUNTER — Ambulatory Visit (HOSPITAL_COMMUNITY)
Admission: EM | Admit: 2018-07-13 | Discharge: 2018-07-13 | Disposition: A | Discharge status: Home / Self Care | Payer: BLUE CROSS/BLUE SHIELD | Attending: Internal Medicine | Admitting: Internal Medicine

## 2018-07-13 DIAGNOSIS — R110 Nausea: Secondary | ICD-10-CM | POA: Diagnosis not present

## 2018-07-13 DIAGNOSIS — R42 Dizziness and giddiness: Secondary | ICD-10-CM | POA: Diagnosis not present

## 2018-07-13 LAB — CBC
HCT: 37.7 % (ref 36.0–46.0)
Hemoglobin: 12.1 g/dL (ref 12.0–15.0)
MCH: 26.9 pg (ref 26.0–34.0)
MCHC: 32.1 g/dL (ref 30.0–36.0)
MCV: 84 fL (ref 80.0–100.0)
Platelets: 408 10*3/uL — ABNORMAL HIGH (ref 150–400)
RBC: 4.49 MIL/uL (ref 3.87–5.11)
RDW: 13.9 % (ref 11.5–15.5)
WBC: 7.8 10*3/uL (ref 4.0–10.5)
nRBC: 0 % (ref 0.0–0.2)

## 2018-07-13 LAB — POCT PREGNANCY, URINE: Preg Test, Ur: NEGATIVE

## 2018-07-13 MED ORDER — MECLIZINE HCL 12.5 MG PO TABS: 12.5000 mg | ORAL_TABLET | Freq: Three times a day (TID) | ORAL | 0 refills | Status: DC | PRN

## 2018-07-13 NOTE — ED Provider Notes (Addendum)
[facility]    CSN: [id number] Arrival date & time: 07/13/18  1427     History   Chief Complaint Chief Complaint  Patient presents with  . hemoglobin is low    HPI Chloe Cervantes is a 28 y.o. female past medical history significant for obesity, iron deficiency anemia, PCOS, abnormal uterine bleeding presenting for concern of low hemoglobin.  Patient states her uterine bleeding stopped over a week ago.  Patient states she has had dizziness, nausea, lower extremity numbness for the past few days.  Patient states that dizziness is something she has experienced off and on for the last year.  No known triggering factors, alleviators.  Patient denies room spinning, tinnitus, headache, change in vision, falling with these episodes.  Patient states that she has had some nausea for the last few days, worse in the morning, though has noticed it with certain foods such as chicken.  Patient denies emesis, fever, diarrhea, abdominal pain.  Patient also states that she has had chronic lower extremity numbness, "I do not know, just feels weird kind of like a blood pressure cuff squeezing ".  Patient denies lower extremity weakness, urinary or fecal incontinence, abnormal gait, back pain.  Patient has been followed by her primary care routinely for her anemia.  Last appoint was 06/16/2018.  Patient underwent transvaginal ultrasound on 06/30/2018: significant for 5.6 cm mildly complex left ovarian cyst.  Patient has scheduled ultrasound follow-up in 08/11/2018.  Past Medical History:  Diagnosis Date  . Depression   . Diabetes mellitus without complication (HCC)   . Hypertension   . PCOS (polycystic ovarian syndrome)     Patient Active Problem List   Diagnosis Date Noted  . PCOS (polycystic ovarian syndrome) 04/07/2018  . Anxiety and depression 04/07/2018  . Iron deficiency anemia 04/07/2018  . Abnormal uterine bleeding (AUB) 01/21/2017    History reviewed. No pertinent surgical  history.  OB History   No obstetric history on file.      Home Medications    Prior to Admission medications   Medication Sig Start Date End Date Taking? Authorizing Provider  meclizine (ANTIVERT) 12.5 MG tablet Take 1 tablet (12.5 mg total) by mouth 3 (three) times daily as needed for dizziness. 07/13/18   Hall-Potvin, [place], PA-C    Family History Family History  Problem Relation Age of Onset  . Diabetes Mother   . Hypertension Mother     Social History Social History   Tobacco Use  . Smoking status: Former Smoker    Types: Cigarettes    Last attempt to quit: 12/22/2016    Years since quitting: 1.5  . Smokeless tobacco: Never Used  Substance Use Topics  . Alcohol use: No  . Drug use: No     Allergies   Patient has no known allergies.   Review of Systems Review of Systems as per HPI   Physical Exam Triage Vital Signs ED Triage Vitals  Enc Vitals Group     BP      Pulse      Resp      Temp      Temp src      SpO2      Weight      Height      Head Circumference      Peak Flow      Pain Score      Pain Loc      Pain Edu?      Excl. in GC?  No data found.  Updated Vital Signs BP 115/69 (BP Location: Right Arm)   Pulse 74   Temp 98.6 F (37 C) (Oral)   Resp 18   Wt 225 lb (102.1 kg)   LMP 06/28/2018   SpO2 100%   BMI 41.82 kg/m   Visual Acuity Right Eye Distance:   Left Eye Distance:   Bilateral Distance:    Right Eye Near:   Left Eye Near:    Bilateral Near:     Physical Exam Vitals signs reviewed.  Constitutional:      General: She is not in acute distress.    Appearance: She is obese.  HENT:     Head: Normocephalic and atraumatic.     Right Ear: Tympanic membrane, ear canal and external ear normal.     Left Ear: Tympanic membrane, ear canal and external ear normal.     Nose: Nose normal. No congestion or rhinorrhea.     Mouth/Throat:     Mouth: Mucous membranes are moist.     Pharynx: Oropharynx is clear. No  oropharyngeal exudate or posterior oropharyngeal erythema.  Eyes:     General: No scleral icterus.    Extraocular Movements: Extraocular movements intact.     Conjunctiva/sclera: Conjunctivae normal.     Pupils: Pupils are equal, round, and reactive to light.     Comments: Negative for nystagmus  Neck:     Musculoskeletal: Normal range of motion and neck supple. No muscular tenderness.  Cardiovascular:     Rate and Rhythm: Normal rate.  Pulmonary:     Effort: Pulmonary effort is normal.  Abdominal:     General: Bowel sounds are normal. There is no distension.     Palpations: Abdomen is soft. There is no mass.     Tenderness: There is no right CVA tenderness, left CVA tenderness, guarding or rebound.     Comments: Mild lower abdomen tenderness  Musculoskeletal:     Comments: Full active range of motion and 5/5 strength in upper and lower extremities bilaterally and symmetric.  Lymphadenopathy:     Cervical: No cervical adenopathy.  Skin:    General: Skin is warm.     Capillary Refill: Capillary refill takes less than 2 seconds.     Coloration: Skin is not jaundiced or pale.     Findings: No bruising.  Neurological:     General: No focal deficit present.     Mental Status: She is alert and oriented to person, place, and time.     Cranial Nerves: No cranial nerve deficit.     Motor: No weakness.     Coordination: Coordination normal.     Gait: Gait normal.     Deep Tendon Reflexes: Reflexes normal.     Comments: Altered sensation in lower extremities from knee down (anterior, posterior, medial, and lateral aspects) bilaterally/symmetric.  Patient states "I can feel your fingers, they just feel different ". Negative head impulse test, negative Romberg test.  Psychiatric:        Mood and Affect: Mood normal.        Behavior: Behavior normal.      UC Treatments / Results  Labs (all labs ordered are listed, but only abnormal results are displayed) Labs Reviewed  CBC  POC  URINE PREG, ED  POCT PREGNANCY, URINE    EKG None  Radiology No results found.  Procedures Procedures (including critical care time)  Medications Ordered in UC Medications - No data to display  Initial Impression /  Assessment and Plan / UC Course  I have reviewed the triage vital signs and the nursing notes.  Pertinent labs & imaging results that were available during my care of the patient were reviewed by me and considered in my medical decision making (see chart for details).     28 year old female with medical history of iron deficiency anemia, abnormal uterine bleeding, PCOS presenting for acute concern of dizziness, nausea, lower extremity sensory disorder.  Physical exam reassuring, urine pregnancy negative in office.  Hemoglobin pending, will contact patient when resulted.  Discharged prior to serology resulted as patient is hemodynamically stable, and physical exam was reassuring.  Patient was contacted after discharge with her lab results: Hemoglobin 12.1 - stable from previous 12.4 on 06/16/18.  Count slightly elevated at 408k (upper limit of normal 400k): Provided reassurance, patient verbalized understanding of her results follow-up with her PCP.   Final Clinical Impressions(s) / UC Diagnoses   Final diagnoses:  Dizziness of unknown etiology  Nausea without vomiting     Discharge Instructions     We will call you with your blood results as soon as they are resulted. Please follow-up with your PCP for further serology work-up. Keep your June transvaginal ultrasound appointment. May take nausea medication as needed. Return if you develop fever, worsening dizziness, weakness, pain or swelling in your lower legs, or uterine bleeding resumes.    ED Prescriptions    Medication Sig Dispense Auth. Provider   meclizine (ANTIVERT) 12.5 MG tablet Take 1 tablet (12.5 mg total) by mouth 3 (three) times daily as needed for dizziness. 30 tablet Hall-Potvin, [place], PA-C      Controlled Substance Prescriptions [place] Controlled Substance Registry consulted? Not Applicable   Odette Fraction [place], PA-C 07/13/18 1624    Hall-Potvin, [place], [place] 07/13/18 1640

## 2018-07-13 NOTE — ED Triage Notes (Signed)
Pt states that since Sunday her legs have been going numb. Pt states her PCP says her Hemoglobin was low. And if she felt any numbness she needed to come in.

## 2018-07-13 NOTE — Discharge Instructions (Signed)
We will call you with your blood results as soon as they are resulted. Please follow-up with your PCP for further serology work-up. Keep your June transvaginal ultrasound appointment. May take nausea medication as needed. Return if you develop fever, worsening dizziness, weakness, pain or swelling in your lower legs, or uterine bleeding resumes.

## 2018-07-13 NOTE — Telephone Encounter (Signed)
Spoke with pt complaints of dizziness, numbness of her leg, pt verbalized understanding that dr Salomon Fick advises her to go to the urgent care for evaluation.

## 2018-07-14 ENCOUNTER — Encounter: Payer: Self-pay | Admitting: Family Medicine

## 2018-07-14 ENCOUNTER — Ambulatory Visit (INDEPENDENT_AMBULATORY_CARE_PROVIDER_SITE_OTHER): Payer: BLUE CROSS/BLUE SHIELD | Admitting: Family Medicine

## 2018-07-14 DIAGNOSIS — E282 Polycystic ovarian syndrome: Secondary | ICD-10-CM | POA: Diagnosis not present

## 2018-07-14 DIAGNOSIS — R110 Nausea: Secondary | ICD-10-CM

## 2018-07-14 MED ORDER — OMEPRAZOLE 20 MG PO CPDR: 20.0000 mg | DELAYED_RELEASE_CAPSULE | Freq: Every day | ORAL | 3 refills | Status: DC

## 2018-07-14 NOTE — Progress Notes (Signed)
Virtual Visit via Telephone Note  I connected with Chloe Cervantes on 07/14/18 at  3:00 PM EDT by telephone and verified that I am speaking with the correct person using two identifiers.   I discussed the limitations, risks, security and privacy concerns of performing an evaluation and management service by telephone and the availability of in person appointments. I also discussed with the patient that there may be a patient responsible charge related to this service. The patient expressed understanding and agreed to proceed.  Location patient: home Location provider: work or home office Participants present for the call: patient, provider Patient did not have a visit in the prior 7 days to address this/these issue(s).   History of Present Illness: Pt is a 28 yo female with pmh sig for anxiety, PCOS, iron def anemia, depression, AUB.  Pt endorses increased stress/anxiety since hearing lab results from UC.  Seen 07/13/18 by UC for dizziness.  States was told "hemoglobin is 4 points lower than before and platelets are slightly high".   Pt also notes a sharp pain in her abdomen and numb sensation in legs after being examined at [facility].  Pt given meclizine for nausea.  Pt mentions AUB stopped 1.5 wks ago.  Nausea in am, at times smells will make her feel sick, then has increased burping.  The belching causes a burning sensation in her throat.  No dizziness or nausea today.  Cannot associate the nausea with food.  Pt typically eats dinner late then goes to bed shortly after.  Taking iron and PNV in am.  Denies pain in chest, SOB, HAs, sore throat, cough, constipation, diarrhea.     Observations/Objective: Patient sounds cheerful and well on the phone. I do not appreciate any SOB. Speech and thought processing are grossly intact. Patient reported vitals:  Assessment and Plan: Nausea  -discussed possible causes including gerd, pregnancy though test negative, cholecystitis, medications -  Plan: omeprazole (PRILOSEC) 20 MG capsule  PCOS -5.6 cm mildly complex L ovarian cyst noted on u/s from 06/30/18 -f/u u/s schedule in 6 wks. - Plan: referral to OB/Gyn   Follow Up Instructions: F/u prn  I did not refer this patient for an OV in the next 24 hours for this/these issue(s).  I discussed the assessment and treatment plan with the patient. The patient was provided an opportunity to ask questions and all were answered. The patient agreed with the plan and demonstrated an understanding of the instructions.   The patient was advised to call back or seek an in-person evaluation if the symptoms worsen or if the condition fails to improve as anticipated.  I provided 22 minutes of non-face-to-face time during this encounter.   Deeann Saint, MD

## 2018-07-25 ENCOUNTER — Other Ambulatory Visit: Payer: Self-pay

## 2018-07-26 ENCOUNTER — Ambulatory Visit (INDEPENDENT_AMBULATORY_CARE_PROVIDER_SITE_OTHER): Payer: BC Managed Care – PPO | Admitting: Gynecology

## 2018-07-26 ENCOUNTER — Encounter: Payer: Self-pay | Admitting: Gynecology

## 2018-07-26 VITALS — BP 124/78 | Ht 62.0 in | Wt 224.0 lb

## 2018-07-26 DIAGNOSIS — N83209 Unspecified ovarian cyst, unspecified side: Secondary | ICD-10-CM

## 2018-07-26 DIAGNOSIS — N926 Irregular menstruation, unspecified: Secondary | ICD-10-CM

## 2018-07-26 NOTE — Patient Instructions (Signed)
Follow-up for lab test results and for the ultrasound as scheduled.

## 2018-07-26 NOTE — Progress Notes (Signed)
Chloe Cervantes 1990/12/17 [id number]        28 y.o.  [id number] new patient for annual gynecologic exam and discussion about PCOS, her irregular periods, attempts to achieve pregnancy and recent ultrasound showing 5.2 x 5.4 x 5.6 cm mildly complex cyst in the left adnexa most likely representing ovarian cyst.  Right ovary normal.  Uterus grossly normal with endometrial echo 9.2 mm.  Was planned for follow-up ultrasound end of this month.    Past medical history,surgical history, problem list, medications, allergies, family history and social history were all reviewed and documented as reviewed in the EPIC chart.  ROS:  Performed with pertinent positives and negatives included in the history, assessment and plan.   Additional significant findings : None   Exam: Kennon PortelaKim Gardner assistant Vitals:   07/26/18 1552  BP: 124/78  Weight: 224 lb (101.6 kg)  Height: 5\' 2"  (1.575 m)   Body mass index is 40.97 kg/m.  General appearance:  Normal affect, orientation and appearance. Skin: Grossly normal HEENT: Without gross lesions.  No cervical or supraclavicular adenopathy. Thyroid normal.  Lungs:  Clear without wheezing, rales or rhonchi Cardiac: RR, without RMG Abdominal:  Soft, nontender, without masses, guarding, rebound, organomegaly or hernia Breasts:  Examined lying and sitting without masses, retractions, discharge or axillary adenopathy. Pelvic:  Ext, BUS, Vagina: Normal  Cervix: Normal  Uterus: Anteverted, normal size, shape and contour, midline and mobile nontender   Adnexa: Without masses or tenderness    Anus and perineum: Normal   Rectovaginal: Normal sphincter tone without palpated masses or tenderness.    Assessment/Plan:  28 y.o. [id number] female for annual gynecologic exam.   1. Irregular menses with history of PCOS.  Notes since the birth of her last child 2 years ago her menses have been irregular with a will skip every other month to up to every 55824-month.  When she  does have her bleeding it can occur up to 1 month of spotting on and off.  Notes to spontaneous pregnancies several years ago without difficulty but has gained over 40 pounds since then.  I discussed PCOS with her in detail to include issues such as endometrial hyperplasia/endometrial cancer and associated issues of hypertension and diabetes.  She is told that she is prediabetic.  Options for management of PCOS also discussed to include weight loss, medications such as metformin, progesterone only or oral contraceptives for menstrual regulation and prevention of hyperplasia, ovulatory stimulant such as Clomid or letrozole for pregnancy all discussed.  My recommendation at this point is to seriously address weight loss and that losing 5% to 10% of her weight could easily promote regular ovulation and enhance her chances of pregnancy.  She is on a prenatal vitamin daily.  Patient commits to trying this.  Given the irregularity of her menses I recommended endometrial assessment rule out significant hyperplasia.  We will then discuss intermittent progesterone withdrawal for now.  Also will check baseline prolactin and FSH due to irregular menses.  Did have recent TSH which was normal. 2. Hemorrhagic appearing cyst left ovary on recent ultrasound.  Discussed possible etiologies to include physiologic and pathologic including ovarian cancer.  I recommended she repeat the ultrasound at a 6324-month interval from her first and to do a sonohysterogram for endometrial assessment and sampling given her irregularity of menses and she agrees to schedule at a 8-week interval from her first ultrasound. 3. Breast health.  SBE monthly reviewed. 4. STD screening offered and declined. 5. Pap smear  02/2017.  No Pap smear done today.  No history of abnormal Pap smears previously.  Recommend repeat Pap smear at 3-year interval per current screening guidelines. 6. Health maintenance.  No routine lab work done as patient does this at her  primary physician's office.  Follow-up for sonohysterogram as scheduled as well as lab test results.   Anastasio Auerbach MD, 4:52 PM 07/26/2018

## 2018-07-27 LAB — PROLACTIN: Prolactin: 4.8 ng/mL

## 2018-07-27 LAB — FOLLICLE STIMULATING HORMONE: FSH: 8.4 m[IU]/mL

## 2018-08-11 ENCOUNTER — Other Ambulatory Visit: Payer: BLUE CROSS/BLUE SHIELD

## 2018-09-04 ENCOUNTER — Ambulatory Visit (INDEPENDENT_AMBULATORY_CARE_PROVIDER_SITE_OTHER): Payer: BC Managed Care – PPO | Admitting: Gynecology

## 2018-09-04 ENCOUNTER — Ambulatory Visit (INDEPENDENT_AMBULATORY_CARE_PROVIDER_SITE_OTHER): Payer: BC Managed Care – PPO

## 2018-09-04 ENCOUNTER — Other Ambulatory Visit: Payer: Self-pay | Admitting: Gynecology

## 2018-09-04 ENCOUNTER — Encounter: Payer: Self-pay | Admitting: Gynecology

## 2018-09-04 ENCOUNTER — Other Ambulatory Visit: Payer: Self-pay

## 2018-09-04 VITALS — BP 120/80

## 2018-09-04 DIAGNOSIS — N939 Abnormal uterine and vaginal bleeding, unspecified: Secondary | ICD-10-CM

## 2018-09-04 DIAGNOSIS — N9489 Other specified conditions associated with female genital organs and menstrual cycle: Secondary | ICD-10-CM

## 2018-09-04 DIAGNOSIS — N83202 Unspecified ovarian cyst, left side: Secondary | ICD-10-CM

## 2018-09-04 DIAGNOSIS — N938 Other specified abnormal uterine and vaginal bleeding: Secondary | ICD-10-CM

## 2018-09-04 DIAGNOSIS — N921 Excessive and frequent menstruation with irregular cycle: Secondary | ICD-10-CM | POA: Diagnosis not present

## 2018-09-04 DIAGNOSIS — N840 Polyp of corpus uteri: Secondary | ICD-10-CM

## 2018-09-04 DIAGNOSIS — N925 Other specified irregular menstruation: Secondary | ICD-10-CM

## 2018-09-04 DIAGNOSIS — N83209 Unspecified ovarian cyst, unspecified side: Secondary | ICD-10-CM

## 2018-09-04 DIAGNOSIS — N926 Irregular menstruation, unspecified: Secondary | ICD-10-CM

## 2018-09-04 MED ORDER — MEGESTROL ACETATE 20 MG PO TABS: 20.0000 mg | ORAL_TABLET | Freq: Every day | ORAL | 1 refills | Status: DC

## 2018-09-04 NOTE — Progress Notes (Signed)
    Chloe Cervantes Jan 18, 1991 [id number]        28 y.o.  [id number] presents for sonohysterogram.  History of irregular menses with a PCOS pattern.  Recent ultrasound showed mildly complex cyst left adnexa most likely representing ovarian cyst measuring 5.2 x 5.4 x 5.6 cm.  Right ovary normal.  Uterus was grossly normal with endometrial echo 9.2 mm.  Past medical history,surgical history, problem list, medications, allergies, family history and social history were all reviewed and documented in the EPIC chart.  Directed ROS with pertinent positives and negatives documented in the history of present illness/assessment and plan.  Exam: [place] assistant BP 120/80 General appearance:  Normal Abdomen soft nontender without masses guarding rebound Pelvic external BUS vagina with menstrual-like flow.  Cervix grossly normal.  Uterus grossly normal midline mobile.  Adnexa without gross masses or tenderness.  Ultrasound transvaginal and transabdominal shows uterus normal size and echotexture.  Endometrial echo 11.5 mm.  Right ovary is normal with numerous small follicles.  Left ovary with numerous small follicles.  Left adnexa with cystic mass measuring 80 x 65 x 58 mm.  Negative color flow Doppler.  Small amount of fluid in the cul-de-sac.  Sonohysterogram performed, sterile technique, easy catheter introduction, good distention with 32 x 7 x 12 mm defect consistent with endometrial polyp.  Endometrial biopsy taken.  Patient tolerated well.  Assessment/Plan:  28 y.o. [id number] with heavy irregular bleeding and ultrasound suggesting endometrial polyp.  Also has cystic mass in the left adnexa which appears separate from the ovary.  Reviewed differential to include ovarian cyst versus paraovarian cyst versus possible hydrosalpinx.  There were 2 simple cyst measured in an ultrasound on the left side in 2018.  Questionable persistence of the same cysts.  Options for management reviewed to include  hysteroscopy alone with continued observation of the left adnexal cysts versus proceeding with laparoscopic evaluation, cystectomy versus possible salpingectomy if hydro-versus salpingo-oophorectomy if significant involvement of the ovary all discussed.  This could be coupled with the hysteroscopy D&C for removal of the endometrial polyp.  At this point the patient would like to proceed with surgery.  Will check CBC now given her history of heavy bleeding over the last several weeks with passage of clots.  Will start on Megace 20 mg daily for suppression.  We will also check a baseline CA 125.  I also discussed with the patient the possibility for robotic approach.    Anastasio Auerbach MD, 11:24 AM 09/04/2018

## 2018-09-04 NOTE — Patient Instructions (Signed)
Office will call you with blood test results and to arrange for follow-up surgery.

## 2018-09-06 LAB — CBC WITH DIFFERENTIAL/PLATELET
Absolute Monocytes: 515 cells/uL (ref 200–950)
Basophils Absolute: 53 cells/uL (ref 0–200)
Basophils Relative: 0.8 %
Eosinophils Absolute: 112 cells/uL (ref 15–500)
Eosinophils Relative: 1.7 %
HCT: 36.7 % (ref 35.0–45.0)
Hemoglobin: 11.9 g/dL (ref 11.7–15.5)
Lymphs Abs: 2429 cells/uL (ref 850–3900)
MCH: 27.7 pg (ref 27.0–33.0)
MCHC: 32.4 g/dL (ref 32.0–36.0)
MCV: 85.3 fL (ref 80.0–100.0)
MPV: 11.1 fL (ref 7.5–12.5)
Monocytes Relative: 7.8 %
Neutro Abs: 3491 cells/uL (ref 1500–7800)
Neutrophils Relative %: 52.9 %
Platelets: 410 10*3/uL — ABNORMAL HIGH (ref 140–400)
RBC: 4.3 10*6/uL (ref 3.80–5.10)
RDW: 13.7 % (ref 11.0–15.0)
Total Lymphocyte: 36.8 %
WBC: 6.6 10*3/uL (ref 3.8–10.8)

## 2018-09-06 LAB — CA 125: CA 125: 24 U/mL (ref <35)

## 2018-09-07 ENCOUNTER — Ambulatory Visit: Payer: BC Managed Care – PPO | Admitting: Gynecology

## 2018-09-07 ENCOUNTER — Other Ambulatory Visit: Payer: BC Managed Care – PPO

## 2018-09-12 ENCOUNTER — Telehealth: Payer: Self-pay

## 2018-09-12 MED ORDER — MISOPROSTOL 100 MCG PO TABS: ORAL_TABLET | ORAL | 0 refills | Status: DC

## 2018-09-12 NOTE — Telephone Encounter (Signed)
I spoke with patient about scheduling surgery. I discussed with her that I checked on her insurance and the [place] primary is covering claims at 100% per the rep at [facility].  No prepymt due.  No PA required.  We discussed dates. I offered her 8/17 but that is her birthday and she has plans.  I scheduled her for 9/1 at 7:30am at [facility].  Covid screen scheduled accordingly and patient was advised to quarantine from time of test until surgery.  I advised her regarding need for Cytotec tab intravaginally hs night before surgery. Warned her might have some cramping, spotting and/or bleeding but this just means tablet is working. No worries.  Rx sent.  Pre op appt scheduled with Dr. Loetta Rough for 8/25 at 8:00am.

## 2018-09-19 ENCOUNTER — Encounter: Payer: Self-pay | Admitting: Gynecology

## 2018-09-26 ENCOUNTER — Other Ambulatory Visit: Payer: Self-pay | Admitting: Gynecology

## 2018-10-06 ENCOUNTER — Other Ambulatory Visit: Payer: Self-pay | Admitting: Family Medicine

## 2018-10-06 DIAGNOSIS — R110 Nausea: Secondary | ICD-10-CM

## 2018-10-09 ENCOUNTER — Other Ambulatory Visit: Payer: Self-pay

## 2018-10-10 ENCOUNTER — Ambulatory Visit (INDEPENDENT_AMBULATORY_CARE_PROVIDER_SITE_OTHER): Payer: BC Managed Care – PPO | Admitting: Gynecology

## 2018-10-10 ENCOUNTER — Encounter: Payer: Self-pay | Admitting: Gynecology

## 2018-10-10 ENCOUNTER — Other Ambulatory Visit: Payer: Self-pay | Admitting: Gynecology

## 2018-10-10 VITALS — BP 124/80

## 2018-10-10 DIAGNOSIS — N840 Polyp of corpus uteri: Secondary | ICD-10-CM | POA: Diagnosis not present

## 2018-10-10 DIAGNOSIS — N949 Unspecified condition associated with female genital organs and menstrual cycle: Secondary | ICD-10-CM

## 2018-10-10 DIAGNOSIS — N921 Excessive and frequent menstruation with irregular cycle: Secondary | ICD-10-CM | POA: Diagnosis not present

## 2018-10-10 NOTE — Progress Notes (Signed)
° ° °  Chloe Cervantes Mar 18, 1990 [id number]        28 y.o.  [id number] presents for her preoperative visit for upcoming laparoscopy, hysteroscopy D&C.  History of irregular heavy menses and PCOS.  Sonohysterogram 08/2018 showed a 80 x 65 x 58 mm cystic left adnexal mass appears separate from the left ovary and a 32 x 7 x 12 mm endometrial defect consistent with sessile polyp.  In review of her records 06/2018 showed a 5.6 cm mildly complex cyst in the left adnexa and an ultrasound 11/2016 showed a 4.1 cm and a separate 2.6 cm left adnexal cystic mass.  Endometrial biopsy showed secretory endometrium.  Pap smear 02/2017 normal.  Patient has been having intermittent left-sided pain and currently attempting pregnancy.  Past medical history,surgical history, problem list, medications, allergies, family history and social history were all reviewed and documented in the EPIC chart.  Directed ROS with pertinent positives and negatives documented in the history of present illness/assessment and plan.  Exam: Caryn Bee assistant Vitals:   10/10/18 0759  BP: 124/80   General appearance:  Normal HEENT normal Lungs clear Cardiac regular rate no rubs murmurs or gallops Abdomen soft nontender without masses guarding rebound Pelvic external BUS vagina normal.  With menstrual flow.  Cervix normal.  Uterus grossly normal midline mobile nontender.  Adnexa without masses or tenderness.  Assessment/Plan:  28 y.o. [id number] with persistent left adnexal cystic mass probably dating to 2018.  Measuring 8 cm.  We discussed the differential to include paratubal cyst, hydrosalpinx or ovarian cyst.  We also discussed the possibility of it being non-gynecologic.  We will plan on laparoscopic assessment and attempt to resolve the cystic mass either through incision or excision.  If hydrosalpinx then we will plan on salpingectomy.  She understands although she is attempting pregnancy that a significant hydrosalpinx should  probably be removed instead of drained for fear of recurrence, ectopic pregnancy risks and possible detrimental effects on early IUP.  If ovarian cystic mass then will attempt cystectomy with salvage of the ovary but she understands that we may need to proceed with a oophorectomy/salpingo-oophorectomy and she gives me permission to do so.  She also understands if non-gynecologic that we may not resolve the issue at this time but refer with possibilities of future surgeries.  We will also plan on hysteroscopy with resection of endometrial defects.  She understands that this will not guarantee that her menses will regulate.  The expected intraoperative and postoperative courses as well as the recovery period were reviewed. The risks of infection, prolonged antibiotics, reoperation for abscess or hematoma formation was discussed. The risks of hemorrhage necessitating transfusion and the risks of transfusion reaction, hepatitis, HIV, mad cow disease and other unknown entities was also discussed. Incisional complications to include opening and draining of incisions and closure by secondary intention, dehiscence and long-term issues of keloid/cosmetics and hernia formation were reviewed. The risk of inadvertent injury to internal organs including bowel, bladder, ureters, vessels, nerves either immediately recognized or delay recognized necessitating major exploratory reparative surgeries and future reparative surgeries including bowel resection, ostomy formation, bladder repair, ureteral damage repair was discussed with her. The patient's questions were answered to her satisfaction and she is ready to proceed with surgery.   Anastasio Auerbach MD, 8:20 AM 10/10/2018

## 2018-10-10 NOTE — Patient Instructions (Signed)
Followup for surgery as scheduled. 

## 2018-10-10 NOTE — H&P (Signed)
Chloe Cervantes 09/16/1990 [id number]   History and Physical  Chief complaint: Heavy irregular menses, endometrial polyp, persistent left adnexal cystic mass  History of present illness: 28 y.o. [id number] with history of irregular heavy menses and PCOS.  Sonohysterogram 08/2018 showed a 80 x 65 x 58 mm cystic left adnexal mass appears separate from the left ovary and a 32 x 7 x 12 mm endometrial defect consistent with sessile polyp.  In review of her records 06/2018 showed a 5.6 cm mildly complex cyst in the left adnexa and an ultrasound 11/2016 showed a 4.1 cm and a separate 2.6 cm left adnexal cystic mass.  CA 125 normal.  Endometrial biopsy showed secretory endometrium.  Pap smear 02/2017 normal.  Patient has been having intermittent left-sided pain and currently attempting pregnancy.  Admitted for laparoscopic assessment of left adnexal cystic mass with removal and hysteroscopy D&C with resection of endometrial polyp.  Past Medical History:  Diagnosis Date  . Depression   . Diabetes mellitus without complication ([place])   . Hypertension   . PCOS (polycystic ovarian syndrome)     No past surgical history on file.  Family History  Problem Relation Age of Onset  . Diabetes Mother   . Hypertension Mother   . Hyperlipidemia Mother     Social History:  reports that she has quit smoking. She has never used smokeless tobacco. She reports current alcohol use. She reports that she does not use drugs.  Allergies: No Known Allergies  Medications: See Epic for the most current list of medications.  ROS:  Was performed and pertinent positives and negatives are included in the history of present illness.  Exam:  Caryn Bee assistant Vitals:   10/10/18 0759  BP: 124/80   General appearance:  Normal HEENT normal Lungs clear Cardiac regular rate no rubs murmurs or gallops Abdomen soft nontender without masses guarding rebound Pelvic external BUS vagina normal.  With menstrual flow.   Cervix normal.  Uterus grossly normal midline mobile nontender.  Adnexa without masses or tenderness.    Assessment/Plan:  28 y.o. [id number] with persistent left adnexal cystic mass probably dating to 2018.  Measuring 8 cm.  We discussed the differential to include paratubal cyst, hydrosalpinx or ovarian cyst.  We also discussed the possibility of it being non-gynecologic.  We will plan on laparoscopic assessment and attempt to resolve the cystic mass either through incision or excision.  If hydrosalpinx then we will plan on salpingectomy.  She understands although she is attempting pregnancy that a significant hydrosalpinx should probably be removed instead of drained for fear of recurrence, ectopic pregnancy risks and possible detrimental effects on early IUP.  If ovarian cystic mass then will attempt cystectomy with salvage of the ovary but she understands that we may need to proceed with a oophorectomy/salpingo-oophorectomy and she gives me permission to do so.  She also understands if non-gynecologic that we may not resolve the issue at this time but refer with possibilities of future surgeries.  We will also plan on hysteroscopy with resection of endometrial defects.  She understands that this will not guarantee that her menses will regulate.  The expected intraoperative and postoperative courses as well as the recovery period were reviewed. The risks of infection, prolonged antibiotics, reoperation for abscess or hematoma formation was discussed. The risks of hemorrhage necessitating transfusion and the risks of transfusion reaction, hepatitis, HIV, mad cow disease and other unknown entities was also discussed. Incisional complications to include opening and draining of incisions and  closure by secondary intention, dehiscence and long-term issues of keloid/cosmetics and hernia formation were reviewed. The risk of inadvertent injury to internal organs including cervix, vagina, uterus, bowel, bladder,  ureters, vessels, nerves either immediately recognized or delay recognized necessitating major exploratory reparative surgeries and future reparative surgeries including bowel resection, ostomy formation, bladder repair, ureteral damage repair was discussed with her. The patient's questions were answered to her satisfaction and she is ready to proceed with surgery.    Dara Lordsimothy P Lissa Rowles MD, 12:44 PM 10/10/2018

## 2018-10-11 NOTE — Telephone Encounter (Signed)
Pharmacy requesting 90 days supply.

## 2018-10-11 NOTE — Telephone Encounter (Signed)
She is having surgery next week and will not need a 90-day supply.  Refill 30

## 2018-10-13 ENCOUNTER — Other Ambulatory Visit (HOSPITAL_COMMUNITY)
Admission: RE | Admit: 2018-10-13 | Discharge: 2018-10-13 | Disposition: A | Discharge status: Home / Self Care | Payer: BC Managed Care – PPO | Source: Ambulatory Visit | Attending: Gynecology | Admitting: Gynecology

## 2018-10-13 DIAGNOSIS — Z01812 Encounter for preprocedural laboratory examination: Secondary | ICD-10-CM | POA: Diagnosis not present

## 2018-10-13 DIAGNOSIS — Z20828 Contact with and (suspected) exposure to other viral communicable diseases: Secondary | ICD-10-CM | POA: Diagnosis not present

## 2018-10-13 LAB — SARS CORONAVIRUS 2 (TAT 6-24 HRS): SARS Coronavirus 2: NEGATIVE

## 2018-10-16 ENCOUNTER — Encounter (HOSPITAL_BASED_OUTPATIENT_CLINIC_OR_DEPARTMENT_OTHER): Payer: Self-pay | Admitting: *Deleted

## 2018-10-16 ENCOUNTER — Other Ambulatory Visit: Payer: Self-pay

## 2018-10-16 NOTE — Progress Notes (Signed)
Spoke w/ pt via phone for pre-op interview.  Npo after mn.  Arrive at 0530.  Needs cbc, cmp, and serum preg.  Pt had covid test done 10-13-2018.  Will take prilosec am dos w/ sips of water.

## 2018-10-16 NOTE — Anesthesia Preprocedure Evaluation (Addendum)
Anesthesia Evaluation  Patient identified by MRN, date of birth, ID band Patient awake    Reviewed: Allergy & Precautions, NPO status , Patient's Chart, lab work & pertinent test results  Airway Mallampati: III  TM Distance: >3 FB Neck ROM: Full    Dental no notable dental hx. (+) Teeth Intact, Dental Advisory Given   Pulmonary neg pulmonary ROS, Current Smoker,    Pulmonary exam normal breath sounds clear to auscultation       Cardiovascular negative cardio ROS Normal cardiovascular exam Rhythm:Regular Rate:Normal     Neuro/Psych PSYCHIATRIC DISORDERS Anxiety Depression negative neurological ROS     GI/Hepatic Neg liver ROS, GERD  Medicated,  Endo/Other  Morbid obesityPCOS  Renal/GU negative Renal ROS  negative genitourinary   Musculoskeletal negative musculoskeletal ROS (+)   Abdominal   Peds  Hematology negative hematology ROS (+)   Anesthesia Other Findings   Reproductive/Obstetrics                            Anesthesia Physical Anesthesia Plan  ASA: III  Anesthesia Plan: General   Post-op Pain Management:    Induction: Intravenous  PONV Risk Score and Plan: 3 and Midazolam, Dexamethasone and Ondansetron  Airway Management Planned: Oral ETT  Additional Equipment:   Intra-op Plan:   Post-operative Plan: Extubation in OR  Informed Consent: I have reviewed the patients History and Physical, chart, labs and discussed the procedure including the risks, benefits and alternatives for the proposed anesthesia with the patient or authorized representative who has indicated his/her understanding and acceptance.     Dental advisory given  Plan Discussed with: CRNA  Anesthesia Plan Comments:         Anesthesia Quick Evaluation

## 2018-10-17 ENCOUNTER — Ambulatory Visit (HOSPITAL_BASED_OUTPATIENT_CLINIC_OR_DEPARTMENT_OTHER)
Admission: RE | Admit: 2018-10-17 | Discharge: 2018-10-17 | Disposition: A | Discharge status: Home / Self Care | Payer: BC Managed Care – PPO | Attending: Gynecology | Admitting: Gynecology

## 2018-10-17 ENCOUNTER — Ambulatory Visit (HOSPITAL_BASED_OUTPATIENT_CLINIC_OR_DEPARTMENT_OTHER): Payer: BC Managed Care – PPO | Admitting: Anesthesiology

## 2018-10-17 ENCOUNTER — Encounter (HOSPITAL_BASED_OUTPATIENT_CLINIC_OR_DEPARTMENT_OTHER): Payer: Self-pay | Admitting: Emergency Medicine

## 2018-10-17 ENCOUNTER — Encounter (HOSPITAL_BASED_OUTPATIENT_CLINIC_OR_DEPARTMENT_OTHER)
Admission: RE | Disposition: A | Discharge status: Home / Self Care | Payer: Self-pay | Source: Home / Self Care | Attending: Gynecology

## 2018-10-17 ENCOUNTER — Other Ambulatory Visit: Payer: Self-pay

## 2018-10-17 DIAGNOSIS — E119 Type 2 diabetes mellitus without complications: Secondary | ICD-10-CM | POA: Diagnosis not present

## 2018-10-17 DIAGNOSIS — F418 Other specified anxiety disorders: Secondary | ICD-10-CM | POA: Diagnosis not present

## 2018-10-17 DIAGNOSIS — Z79899 Other long term (current) drug therapy: Secondary | ICD-10-CM | POA: Insufficient documentation

## 2018-10-17 DIAGNOSIS — N838 Other noninflammatory disorders of ovary, fallopian tube and broad ligament: Secondary | ICD-10-CM | POA: Insufficient documentation

## 2018-10-17 DIAGNOSIS — K219 Gastro-esophageal reflux disease without esophagitis: Secondary | ICD-10-CM | POA: Insufficient documentation

## 2018-10-17 DIAGNOSIS — I10 Essential (primary) hypertension: Secondary | ICD-10-CM | POA: Diagnosis not present

## 2018-10-17 DIAGNOSIS — N858 Other specified noninflammatory disorders of uterus: Secondary | ICD-10-CM | POA: Diagnosis not present

## 2018-10-17 DIAGNOSIS — E282 Polycystic ovarian syndrome: Secondary | ICD-10-CM | POA: Diagnosis not present

## 2018-10-17 DIAGNOSIS — R9389 Abnormal findings on diagnostic imaging of other specified body structures: Secondary | ICD-10-CM | POA: Diagnosis not present

## 2018-10-17 DIAGNOSIS — N925 Other specified irregular menstruation: Secondary | ICD-10-CM | POA: Insufficient documentation

## 2018-10-17 DIAGNOSIS — N921 Excessive and frequent menstruation with irregular cycle: Secondary | ICD-10-CM

## 2018-10-17 DIAGNOSIS — N8500 Endometrial hyperplasia, unspecified: Secondary | ICD-10-CM

## 2018-10-17 DIAGNOSIS — Z87891 Personal history of nicotine dependence: Secondary | ICD-10-CM | POA: Diagnosis not present

## 2018-10-17 DIAGNOSIS — D509 Iron deficiency anemia, unspecified: Secondary | ICD-10-CM | POA: Diagnosis not present

## 2018-10-17 DIAGNOSIS — N840 Polyp of corpus uteri: Secondary | ICD-10-CM | POA: Diagnosis not present

## 2018-10-17 HISTORY — DX: Gastro-esophageal reflux disease without esophagitis: K21.9

## 2018-10-17 HISTORY — DX: Personal history of gestational diabetes: Z86.32

## 2018-10-17 HISTORY — DX: Other complications of anesthesia, initial encounter: T88.59XA

## 2018-10-17 HISTORY — DX: Polyp of corpus uteri: N84.0

## 2018-10-17 HISTORY — DX: Iron deficiency anemia, unspecified: D50.9

## 2018-10-17 HISTORY — PX: LAPAROSCOPY: SHX197

## 2018-10-17 HISTORY — DX: Personal history of other complications of pregnancy, childbirth and the puerperium: Z87.59

## 2018-10-17 HISTORY — PX: HYSTEROSCOPY WITH D & C: SHX1775

## 2018-10-17 HISTORY — DX: Presence of spectacles and contact lenses: Z97.3

## 2018-10-17 LAB — CBC
HCT: 38.7 % (ref 36.0–46.0)
Hemoglobin: 12.4 g/dL (ref 12.0–15.0)
MCH: 27.3 pg (ref 26.0–34.0)
MCHC: 32 g/dL (ref 30.0–36.0)
MCV: 85.1 fL (ref 80.0–100.0)
Platelets: 418 10*3/uL — ABNORMAL HIGH (ref 150–400)
RBC: 4.55 MIL/uL (ref 3.87–5.11)
RDW: 12.8 % (ref 11.5–15.5)
WBC: 9.1 10*3/uL (ref 4.0–10.5)
nRBC: 0 % (ref 0.0–0.2)

## 2018-10-17 LAB — COMPREHENSIVE METABOLIC PANEL
ALT: 33 U/L (ref 0–44)
AST: 22 U/L (ref 15–41)
Albumin: 4.2 g/dL (ref 3.5–5.0)
Alkaline Phosphatase: 56 U/L (ref 38–126)
Anion gap: 8 (ref 5–15)
BUN: 14 mg/dL (ref 6–20)
CO2: 24 mmol/L (ref 22–32)
Calcium: 8.6 mg/dL — ABNORMAL LOW (ref 8.9–10.3)
Chloride: 103 mmol/L (ref 98–111)
Creatinine, Ser: 0.65 mg/dL (ref 0.44–1.00)
GFR calc Af Amer: >60 mL/min (ref >60)
GFR calc non Af Amer: >60 mL/min (ref >60)
Glucose, Bld: 141 mg/dL — ABNORMAL HIGH (ref 70–99)
Potassium: 3.5 mmol/L (ref 3.5–5.1)
Sodium: 135 mmol/L (ref 135–145)
Total Bilirubin: 0.4 mg/dL (ref 0.3–1.2)
Total Protein: 7.9 g/dL (ref 6.5–8.1)

## 2018-10-17 LAB — HCG, SERUM, QUALITATIVE: Preg, Serum: NEGATIVE

## 2018-10-17 SURGERY — LAPAROSCOPY, DIAGNOSTIC
Anesthesia: General | Site: Uterus

## 2018-10-17 MED ORDER — ROCURONIUM BROMIDE 10 MG/ML (PF) SYRINGE
PREFILLED_SYRINGE | INTRAVENOUS | Status: AC
  Filled 2018-10-17: qty 10

## 2018-10-17 MED ORDER — SODIUM CHLORIDE 0.9 % IR SOLN
Status: DC | PRN
  Administered 2018-10-17: 1000 mL

## 2018-10-17 MED ORDER — DEXAMETHASONE SODIUM PHOSPHATE 4 MG/ML IJ SOLN
INTRAMUSCULAR | Status: DC | PRN
  Administered 2018-10-17: 5 mg via INTRAVENOUS

## 2018-10-17 MED ORDER — PROPOFOL 10 MG/ML IV BOLUS
INTRAVENOUS | Status: AC
  Filled 2018-10-17: qty 20

## 2018-10-17 MED ORDER — SODIUM CHLORIDE 0.9 % IV SOLN
2.0000 g | INTRAVENOUS | Status: AC
  Administered 2018-10-17: 2 g via INTRAVENOUS
  Filled 2018-10-17: qty 2

## 2018-10-17 MED ORDER — KETOROLAC TROMETHAMINE 30 MG/ML IJ SOLN
INTRAMUSCULAR | Status: AC
  Filled 2018-10-17: qty 1

## 2018-10-17 MED ORDER — LIDOCAINE 2% (20 MG/ML) 5 ML SYRINGE
INTRAMUSCULAR | Status: AC
  Filled 2018-10-17: qty 5

## 2018-10-17 MED ORDER — OXYCODONE HCL 5 MG PO TABS
ORAL_TABLET | ORAL | Status: AC
  Filled 2018-10-17: qty 1

## 2018-10-17 MED ORDER — ROCURONIUM BROMIDE 100 MG/10ML IV SOLN
INTRAVENOUS | Status: DC | PRN
  Administered 2018-10-17: 80 mg via INTRAVENOUS

## 2018-10-17 MED ORDER — LIDOCAINE HCL (CARDIAC) PF 100 MG/5ML IV SOSY
PREFILLED_SYRINGE | INTRAVENOUS | Status: DC | PRN
  Administered 2018-10-17: 80 mg via INTRAVENOUS

## 2018-10-17 MED ORDER — OXYCODONE HCL 5 MG PO TABS
5.0000 mg | ORAL_TABLET | Freq: Once | ORAL | Status: AC
  Administered 2018-10-17: 11:00:00 5 mg via ORAL
  Filled 2018-10-17: qty 1

## 2018-10-17 MED ORDER — FENTANYL CITRATE (PF) 100 MCG/2ML IJ SOLN
25.0000 ug | INTRAMUSCULAR | Status: DC | PRN
  Administered 2018-10-17: 50 ug via INTRAVENOUS
  Filled 2018-10-17: qty 1

## 2018-10-17 MED ORDER — ONDANSETRON HCL 4 MG/2ML IJ SOLN
INTRAMUSCULAR | Status: AC
  Filled 2018-10-17: qty 2

## 2018-10-17 MED ORDER — FENTANYL CITRATE (PF) 100 MCG/2ML IJ SOLN
INTRAMUSCULAR | Status: AC
  Filled 2018-10-17: qty 2

## 2018-10-17 MED ORDER — DEXAMETHASONE SODIUM PHOSPHATE 10 MG/ML IJ SOLN
INTRAMUSCULAR | Status: AC
  Filled 2018-10-17: qty 1

## 2018-10-17 MED ORDER — FENTANYL CITRATE (PF) 250 MCG/5ML IJ SOLN
INTRAMUSCULAR | Status: AC
  Filled 2018-10-17: qty 5

## 2018-10-17 MED ORDER — SUGAMMADEX SODIUM 200 MG/2ML IV SOLN
INTRAVENOUS | Status: DC | PRN
  Administered 2018-10-17: 200 mg via INTRAVENOUS

## 2018-10-17 MED ORDER — DIPHENHYDRAMINE HCL 50 MG/ML IJ SOLN
INTRAMUSCULAR | Status: AC
  Filled 2018-10-17: qty 1

## 2018-10-17 MED ORDER — FENTANYL CITRATE (PF) 100 MCG/2ML IJ SOLN
INTRAMUSCULAR | Status: DC | PRN
  Administered 2018-10-17: 100 ug via INTRAVENOUS
  Administered 2018-10-17: 50 ug via INTRAVENOUS
  Administered 2018-10-17: 100 ug via INTRAVENOUS

## 2018-10-17 MED ORDER — OXYCODONE-ACETAMINOPHEN 5-325 MG PO TABS: 1.0000 | ORAL_TABLET | ORAL | 0 refills | Status: DC | PRN

## 2018-10-17 MED ORDER — KETOROLAC TROMETHAMINE 30 MG/ML IJ SOLN
INTRAMUSCULAR | Status: DC | PRN
  Administered 2018-10-17: 30 mg via INTRAVENOUS

## 2018-10-17 MED ORDER — ACETAMINOPHEN 500 MG PO TABS
1000.0000 mg | ORAL_TABLET | Freq: Once | ORAL | Status: AC
  Administered 2018-10-17: 1000 mg via ORAL
  Filled 2018-10-17: qty 2

## 2018-10-17 MED ORDER — ACETAMINOPHEN 500 MG PO TABS
ORAL_TABLET | ORAL | Status: AC
  Filled 2018-10-17: qty 2

## 2018-10-17 MED ORDER — LACTATED RINGERS IV SOLN
INTRAVENOUS | Status: DC
  Administered 2018-10-17 (×3): via INTRAVENOUS
  Filled 2018-10-17: qty 1000

## 2018-10-17 MED ORDER — DIPHENHYDRAMINE HCL 50 MG/ML IJ SOLN
INTRAMUSCULAR | Status: DC | PRN
  Administered 2018-10-17: 25 mg via INTRAVENOUS

## 2018-10-17 MED ORDER — MIDAZOLAM HCL 2 MG/2ML IJ SOLN
INTRAMUSCULAR | Status: AC
  Filled 2018-10-17: qty 2

## 2018-10-17 MED ORDER — MIDAZOLAM HCL 5 MG/5ML IJ SOLN
INTRAMUSCULAR | Status: DC | PRN
  Administered 2018-10-17: 2 mg via INTRAVENOUS

## 2018-10-17 MED ORDER — ONDANSETRON HCL 4 MG/2ML IJ SOLN
INTRAMUSCULAR | Status: DC | PRN
  Administered 2018-10-17: 4 mg via INTRAVENOUS

## 2018-10-17 MED ORDER — PROPOFOL 10 MG/ML IV BOLUS
INTRAVENOUS | Status: DC | PRN
  Administered 2018-10-17: 200 mg via INTRAVENOUS

## 2018-10-17 SURGICAL SUPPLY — 60 items
APPLICATOR ARISTA FLEXITIP XL (MISCELLANEOUS) IMPLANT
APPLICATOR COTTON TIP 6IN STRL (MISCELLANEOUS) ×6 IMPLANT
BAG LAPAROSCOPIC 12 15 PORT 16 (BASKET) IMPLANT
BAG RETRIEVAL 10MM (BASKET) ×1
BAG RETRIEVAL 12/15 (BASKET)
BAG RETRIEVAL 12/15MM (BASKET)
BAG WASTE FLUENT 6L (MISCELLANEOUS) IMPLANT
BARRIER ADHS 3X4 INTERCEED (GAUZE/BANDAGES/DRESSINGS) IMPLANT
CANISTER SUCT 3000ML PPV (MISCELLANEOUS) ×6 IMPLANT
CATH ROBINSON RED A/P 16FR (CATHETERS) ×6 IMPLANT
CONT SPEC 4OZ CLIKSEAL STRL BL (MISCELLANEOUS) IMPLANT
COVER MAYO STAND STRL (DRAPES) ×6 IMPLANT
COVER WAND RF STERILE (DRAPES) ×6 IMPLANT
DERMABOND ADVANCED (GAUZE/BANDAGES/DRESSINGS) ×2
DERMABOND ADVANCED .7 DNX12 (GAUZE/BANDAGES/DRESSINGS) ×4 IMPLANT
DRSG COVADERM PLUS 2X2 (GAUZE/BANDAGES/DRESSINGS) IMPLANT
DRSG OPSITE POSTOP 3X4 (GAUZE/BANDAGES/DRESSINGS) ×4 IMPLANT
DURAPREP 26ML APPLICATOR (WOUND CARE) ×6 IMPLANT
FILTER SMOKE EVAC LAPAROSHD (FILTER) IMPLANT
GAUZE 4X4 16PLY RFD (DISPOSABLE) ×6 IMPLANT
GLOVE BIO SURGEON STRL SZ 6.5 (GLOVE) IMPLANT
GLOVE BIO SURGEON STRL SZ7.5 (GLOVE) ×12 IMPLANT
GLOVE BIO SURGEONS STRL SZ 6.5 (GLOVE)
GLOVE BIOGEL PI IND STRL 7.0 (GLOVE) ×2 IMPLANT
GLOVE BIOGEL PI INDICATOR 7.0 (GLOVE) ×2
GOWN STRL REUS W/TWL LRG LVL3 (GOWN DISPOSABLE) ×8 IMPLANT
GOWN STRL REUS W/TWL XL LVL3 (GOWN DISPOSABLE) ×8 IMPLANT
HEMOSTAT ARISTA ABSORB 3G PWDR (HEMOSTASIS) IMPLANT
IV NS IRRIG 3000ML ARTHROMATIC (IV SOLUTION) IMPLANT
KIT PROCEDURE FLUENT (KITS) ×6 IMPLANT
KIT TURNOVER CYSTO (KITS) ×6 IMPLANT
NDL SAFETY ECLIPSE 18X1.5 (NEEDLE) IMPLANT
NEEDLE HYPO 18GX1.5 SHARP (NEEDLE)
NS IRRIG 500ML POUR BTL (IV SOLUTION) ×6 IMPLANT
PACK LAPAROSCOPY BASIN (CUSTOM PROCEDURE TRAY) ×6 IMPLANT
PACK VAGINAL MINOR WOMEN LF (CUSTOM PROCEDURE TRAY) ×6 IMPLANT
PAD OB MATERNITY 4.3X12.25 (PERSONAL CARE ITEMS) ×6 IMPLANT
PAD PREP 24X48 CUFFED NSTRL (MISCELLANEOUS) ×6 IMPLANT
SCISSORS LAP 5X35 DISP (ENDOMECHANICALS) IMPLANT
SEAL CERVICAL OMNI LOK (ABLATOR) ×4 IMPLANT
SEAL ROD LENS SCOPE MYOSURE (ABLATOR) ×4 IMPLANT
SET IRRIG TUBING LAPAROSCOPIC (IRRIGATION / IRRIGATOR) ×6 IMPLANT
SHEARS HARMONIC ACE PLUS 36CM (ENDOMECHANICALS) ×6 IMPLANT
SOCKS TISSUE FLUENT (MISCELLANEOUS) IMPLANT
SOLUTION ELECTROLUBE (MISCELLANEOUS) ×2 IMPLANT
SUT MNCRL AB 4-0 PS2 18 (SUTURE) ×12 IMPLANT
SUT VICRYL 0 UR6 27IN ABS (SUTURE) ×6 IMPLANT
SUT VICRYL 4-0 PS2 18IN ABS (SUTURE) ×8 IMPLANT
SYR 3ML 23GX1 SAFETY (SYRINGE) IMPLANT
SYR 50ML LL SCALE MARK (SYRINGE) IMPLANT
SYS BAG RETRIEVAL 10MM (BASKET) ×5
SYSTEM BAG RETRIEVAL 10MM (BASKET) ×2 IMPLANT
TOWEL OR 17X26 10 PK STRL BLUE (TOWEL DISPOSABLE) ×8 IMPLANT
TRAY FOLEY W/BAG SLVR 14FR (SET/KITS/TRAYS/PACK) IMPLANT
TROCAR BLADELESS OPT 12M 100M (ENDOMECHANICALS) IMPLANT
TROCAR BLADELESS OPT 5 100 (ENDOMECHANICALS) ×12 IMPLANT
TROCAR XCEL NON-BLD 11X100MML (ENDOMECHANICALS) ×6 IMPLANT
TUBING EVAC SMOKE HEATED PNEUM (TUBING) ×6 IMPLANT
WARMER LAPAROSCOPE (MISCELLANEOUS) ×6 IMPLANT
WATER STERILE IRR 500ML POUR (IV SOLUTION) IMPLANT

## 2018-10-17 NOTE — H&P (Signed)
The patient was examined.  I reviewed the proposed surgery and consent form with the patient.  The dictated history and physical is current and accurate and all questions were answered. The patient is ready to proceed with surgery and has a realistic understanding and expectation for the outcome.   Anastasio Auerbach MD, 7:21 AM 10/17/2018

## 2018-10-17 NOTE — Transfer of Care (Signed)
Immediate Anesthesia Transfer of Care Note  Patient: Chloe Cervantes  Procedure(s) Performed: LAPAROSCOPY DIAGNOSTIC (N/A Abdomen) removal of paratubal cyst (Left Abdomen) HYSTEROSCOPY WITH NOVASURE (N/A Uterus)  Patient Location: PACU  Anesthesia Type:General  Level of Consciousness: awake, alert  and oriented  Airway & Oxygen Therapy: Patient Spontanous Breathing and Patient connected to nasal cannula oxygen  Post-op Assessment: Report given to RN and Post -op Vital signs reviewed and stable  Post vital signs: Reviewed and stable  Last Vitals:  Vitals Value Taken Time  BP 135/90 10/17/18 0915  Temp    Pulse 82 10/17/18 0917  Resp 14 10/17/18 0917  SpO2 100 % 10/17/18 0917  Vitals shown include unvalidated device data.  Last Pain:  Vitals:   10/17/18 0536  TempSrc: Oral  PainSc: 4       Patients Stated Pain Goal: 5 (04/54/09 8119)  Complications: No apparent anesthesia complications

## 2018-10-17 NOTE — Op Note (Signed)
Chloe Cervantes October 07, 1990 [id number]   Post Operative Note   Date of surgery:  10/17/2018  Pre Op Dx: Left adnexal cystic mass, heavy irregular bleeding, endometrial polyp  Post Op Dx: Left paratubal cystic mass, endometrial thickening  Procedure: Laparoscopic left paratubal cystectomy, hysteroscopy D&C  Surgeon:  Anastasio Auerbach  Assistant:  Dellis Filbert, ML  Anesthesia:  General  EBL: 20 cc  Distended media discrepancy: 55 cc saline  Complications:  None  Specimen: #1 opening cell washing #2 left cyst fluid #3 left paratubal cyst #4 endometrial curetting to pathology  Findings: EUA: External BUS vagina normal.  Cervix normal.  Uterus grossly normal midline mobile.  Adnexa without clear palpable masses   Hysteroscopy: Adequate noting fundus, anterior/posterior endometrial surfaces, right/left tubal ostia, lower uterine segment, endocervical canal all visualized.  Thickened endometrium in a shag carpet appearance anterior endometrial surface.  Endometrium otherwise normal with no evidence of polyps.   Operative: Anterior cul-de-sac normal.  Posterior cul-de-sac normal.  Uterus normal size shape and contour.  Right and left ovaries grossly normal.  Right and left fallopian tubes normal length, caliber and fimbriated ends.  Large left multi-cystic paratubal cyst approximately 8 cm freely mobile with fallopian tube draping over it noted.  Completely excised intact with normal-appearing fallopian tube and ovary afterwards.  The upper abdominal exam was noted to be normal with appendix visualized, free and mobile.  The liver was smooth with no abnormalities.  The gallbladder was not visualized.  Procedure: The patient was taken to the operating room, was placed in the low dorsolithotomy position, underwent general anesthesia, received an abdominal and vaginal preparation per nursing personnel and the bladder was emptied with an in and out Foley catheterization.  The timeout was performed  by the surgical team.  Exam under anesthesia was performed and a Hulka tenaculum was placed through the cervix.  The patient was then draped in the usual fashion.  A vertical infraumbilical incision was made and using the 10 mm direct entry trocar the abdomen was directly entered under direct visualization without difficulty and subsequently insufflated.  Right and left 5 mm suprapubic ports were then placed under direct visualization after transillumination for the vessels without difficulty.  Examination of the pelvic organs and upper abdominal exam was carried out with findings noted above.  An opening cell washing was then obtained and sent to cytology.  The left adnexa was elevated and using the harmonic scalpel the peritoneal covering over the paratubal cyst was incised at the opposite side from the ovary and fallopian tube.  The paratubal cyst was then shelled out through blunt and harmonic scalpel dissection and freed from its attachments intact.  The cyst was then placed in an Endobag through the infraumbilical port with assistance from a 5 mm laparoscope through the left suprapubic port and was brought to the infraumbilical opening.  The Endopouch was opened and using a spinal needle the fluid was extracted to allow for delivery of the cyst through the infraumbilical incision and no drainage into the peritoneal cavity.  The 10 mm laparoscope was then reintroduced through the infraumbilical incision with reexamination of the pelvic organs and copious irrigation.  Several small bleeding points were addressed with bipolar cautery along the peritoneal surfaces in the left adnexal dissection site, noted to be away from the fallopian tube.  The 5 mm suprapubic ports were then removed under direct visualization with adequate hemostasis and no evidence of hernia formation.  The 10 mm infraumbilical port was backed out  under direct visualization showing adequate hemostasis and no evidence of hernia formation.  A 0  Vicryl subcutaneous fascial stitch was placed infraumbilically and all skin incisions were then closed using 4-0 Vicryl with interrupted cutaneous stitch.  Skin incisions were injected using 0.25% Marcaine and Dermabond skin adhesive was applied.  Attention was then turned to the hysteroscopic portion of the procedure and the Hulka tenaculum was removed from the cervix, the anterior lip grasped with a single-tooth tenaculum and a paracervical block using 1% lidocaine was placed.  The cervix was gently dilated to admit the MyoSure hysteroscope with findings noted above.  A sharp curettage was then performed to evacuate the endometrial cavity.  Repeat hysteroscopy showed an empty cavity with good distention and no evidence of perforation.  The instruments were removed with hemostasis visualized at the cervix and tenaculum site.  The patient was wanded per protocol.  The instrument, sponge and needle count were verified correct with nursing personnel.  The specimens were identified for pathology.  The patient received intraoperative Toradol, was awakened without difficulty and was taken to recovery room in good condition having tolerated procedure well.     Dara Lordsimothy P Stacey Sago MD, 9:15 AM 10/17/2018

## 2018-10-17 NOTE — Discharge Instructions (Signed)
°  Post Anesthesia Home Care Instructions  Activity: Get plenty of rest for the remainder of the day. A responsible adult should stay with you for 24 hours following the procedure.  For the next 24 hours, DO NOT: -Drive a car -Paediatric nurse -Drink alcoholic beverages -Take any medication unless instructed by your physician -Make any legal decisions or sign important papers.  Meals: Start with liquid foods such as gelatin or soup. Progress to regular foods as tolerated. Avoid greasy, spicy, heavy foods. If nausea and/or vomiting occur, drink only clear liquids until the nausea and/or vomiting subsides. Call your physician if vomiting continues.  Special Instructions/Symptoms: Your throat may feel dry or sore from the anesthesia or the breathing tube placed in your throat during surgery. If this causes discomfort, gargle with warm salt water. The discomfort should disappear within 24 hours.  If you had a scopolamine patch placed behind your ear for the management of post- operative nausea and/or vomiting:  1. The medication in the patch is effective for 72 hours, after which it should be removed.  Wrap patch in a tissue and discard in the trash. Wash hands thoroughly with soap and water. 2. You may remove the patch earlier than 72 hours if you experience unpleasant side effects which may include dry mouth, dizziness or visual disturbances. 3. Avoid touching the patch. Wash your hands with soap and water after contact with the patch.   Postoperative Instructions Laparoscopy  Dr. Phineas Real and the nursing staff have discussed postoperative instructions with you.  If you have any questions please ask them before you leave the hospital, or call Dr Elisabeth Most office at [phone].    We would like to emphasize the following instructions:   ? Call the office to make your follow-up appointment as recommended by Dr Phineas Real (usually 1-2 weeks).  ? You were given a prescription, or one was  ordered for you at the pharmacy you designated.  Get that prescription filled and take the medication according to instructions.  ? You may eat a regular diet, but slowly until you start having bowel movements.  ? Drink plenty of water daily.  ? Nothing in the vagina (intercourse, douching, objects of any kind) for 2 weeks.  When reinitiating intercourse, if it is uncomfortable, stop and make an appointment with Dr Phineas Real to be evaluated.  ? No driving for several days until the anesthesia has worn off and you are not having significant pain.  Car rides (short) are ok, as long as you are not having significant pain, but no traveling out of town until your postoperative appointment.  ? You may shower, but no baths for two weeks.  Walking up and down stairs is ok.  No heavy lifting, prolonged standing, repeated bending or any working out until your first  postoperative appointment.  ? Rest frequently, listen to your body and do not push yourself and overdo it.  ? Call if:  o Your pain medication does not seem strong enough. o Worsening pain or abdominal bloating o Persistent nausea or vomiting o Difficulty with urination or bowel movements. o Temperature of 101 degrees or higher. o Heavy vaginal bleeding.  If your period is due, you may use tampons.   o Incisions become red, tender or begin to drain. o You have any questions or concerns

## 2018-10-17 NOTE — Anesthesia Procedure Notes (Signed)
Procedure Name: Intubation Date/Time: 10/17/2018 7:42 AM Performed by: Bufford Spikes, CRNA Pre-anesthesia Checklist: Patient identified, Emergency Drugs available, Suction available and Patient being monitored Patient Re-evaluated:Patient Re-evaluated prior to induction Oxygen Delivery Method: Circle system utilized Preoxygenation: Pre-oxygenation with 100% oxygen Induction Type: IV induction Ventilation: Mask ventilation without difficulty Laryngoscope Size: Miller and 2 Grade View: Grade I Tube type: Oral Tube size: 7.0 mm Number of attempts: 1 Airway Equipment and Method: Stylet and Oral airway Placement Confirmation: ETT inserted through vocal cords under direct vision,  positive ETCO2 and breath sounds checked- equal and bilateral Secured at: 21 cm Tube secured with: Tape Dental Injury: Teeth and Oropharynx as per pre-operative assessment

## 2018-10-18 ENCOUNTER — Encounter (HOSPITAL_BASED_OUTPATIENT_CLINIC_OR_DEPARTMENT_OTHER): Payer: Self-pay | Admitting: Gynecology

## 2018-10-18 ENCOUNTER — Encounter: Payer: Self-pay | Admitting: Gynecology

## 2018-10-18 NOTE — Anesthesia Postprocedure Evaluation (Signed)
Anesthesia Post Note  Patient: Chloe Cervantes  Procedure(s) Performed: LAPAROSCOPY DIAGNOSTIC (N/A Abdomen) removal of paratubal cyst (Left Abdomen) DILATION AND CURRETAGE WITH HYSTEROSCOPY (N/A Uterus)     Patient location during evaluation: PACU Anesthesia Type: General Level of consciousness: awake and alert Pain management: pain level controlled Vital Signs Assessment: post-procedure vital signs reviewed and stable Respiratory status: spontaneous breathing, nonlabored ventilation, respiratory function stable and patient connected to nasal cannula oxygen Cardiovascular status: blood pressure returned to baseline and stable Postop Assessment: no apparent nausea or vomiting Anesthetic complications: no    Last Vitals:  Vitals:   10/17/18 1050 10/17/18 1152  BP:  128/82  Pulse: 84 87  Resp: 15   Temp: 37.1 C (!) 36.1 C  SpO2: 97% 97%    Last Pain:  Vitals:   10/18/18 0936  TempSrc:   PainSc: 8                  Harlin Mazzoni L [name]

## 2018-10-27 ENCOUNTER — Other Ambulatory Visit: Payer: Self-pay

## 2018-10-30 ENCOUNTER — Ambulatory Visit (INDEPENDENT_AMBULATORY_CARE_PROVIDER_SITE_OTHER): Payer: BC Managed Care – PPO | Admitting: Gynecology

## 2018-10-30 ENCOUNTER — Other Ambulatory Visit: Payer: Self-pay

## 2018-10-30 ENCOUNTER — Encounter: Payer: Self-pay | Admitting: Gynecology

## 2018-10-30 VITALS — BP 118/76

## 2018-10-30 DIAGNOSIS — Z09 Encounter for follow-up examination after completed treatment for conditions other than malignant neoplasm: Secondary | ICD-10-CM

## 2018-10-30 DIAGNOSIS — R300 Dysuria: Secondary | ICD-10-CM

## 2018-10-30 MED ORDER — NORETHINDRONE ACET-ETHINYL EST 1-20 MG-MCG PO TABS: 1.0000 | ORAL_TABLET | Freq: Every day | ORAL | 11 refills | Status: DC

## 2018-10-30 NOTE — Progress Notes (Signed)
    Chloe Cervantes 1990-11-06 [id number]        28 y.o.  [id number] presents for postop check status post laparoscopic left paratubal cyst excision and hysteroscopy D&C.  Has done well since surgery.  Was having pain before hand but now the pain has resolved.  Past medical history,surgical history, problem list, medications, allergies, family history and social history were all reviewed and documented in the EPIC chart.  Directed ROS with pertinent positives and negatives documented in the history of present illness/assessment and plan.  Exam: Vitals:   10/30/18 0926  BP: 118/76   General appearance:  Normal Abdomen soft nontender without masses guarding rebound.  Incision sites healing nicely.  Dermabond remains intact.  Assessment/Plan:  28 y.o. [id number] status post laparoscopic excision left paratubal cyst and hysteroscopy D&C for suspected endometrial polyp which turned out to be thickening of the anterior endometrial surface.  We reviewed pathology which showed benign paratubal cyst and proliferative endometrium.  She was having pain before surgery but notes this pain has resolved.  Would like to start on birth control for now.  Has taken pills in the past.  Loestrin 1/20 equivalent prescribed to start with her next menses.  Will slowly resume all normal activities.  We will follow-up if incisions are an issue but otherwise will follow-up when due for annual exam.  Of note review of her surgical history listed it lists laparoscopic bilateral salpingectomy and hysteroscopy with NovaSure.  This is incorrect and not the procedure performed.  I did a another surgery that day with these procedures but in this patient's case she did not have a bilateral salpingectomy or NovaSure.  She had a paratubal cyst removal.  Her pelvic anatomy at the end of the procedure remained intact with normal-appearing fallopian tubes and ovaries.  She did not have a NovaSure but a straightforward D&C with removal of  the endometrial thickening.  I reviewed this with the patient to avoid any confusion and will have epic correct the chart.    Anastasio Auerbach MD, 9:43 AM 10/30/2018

## 2018-10-30 NOTE — Patient Instructions (Signed)
Start on the birth control pills as we discussed.  Follow-up if any issues.

## 2018-11-06 ENCOUNTER — Encounter: Payer: Self-pay | Admitting: Gynecology

## 2018-11-14 ENCOUNTER — Encounter: Payer: Self-pay | Admitting: Gynecology

## 2018-11-23 ENCOUNTER — Other Ambulatory Visit: Payer: Self-pay

## 2018-11-24 ENCOUNTER — Encounter: Payer: Self-pay | Admitting: Gynecology

## 2018-11-24 ENCOUNTER — Ambulatory Visit (INDEPENDENT_AMBULATORY_CARE_PROVIDER_SITE_OTHER): Payer: BC Managed Care – PPO | Admitting: Gynecology

## 2018-11-24 VITALS — BP 134/84

## 2018-11-24 DIAGNOSIS — Z9889 Other specified postprocedural states: Secondary | ICD-10-CM

## 2018-11-24 NOTE — Progress Notes (Signed)
    Chloe Cervantes 24-Jun-1990 [id number]        28 y.o.  [id number] presents concerned that her incisions are infected status post laparoscopic removal of a paratubal cyst 10/17/2018.  No fever chills or other symptoms.  No drainage.  Otherwise doing well.  Past medical history,surgical history, problem list, medications, allergies, family history and social history were all reviewed and documented in the EPIC chart.  Directed ROS with pertinent positives and negatives documented in the history of present illness/assessment and plan.  Exam: Vitals:   11/24/18 0806  BP: 134/84   General appearance:  Normal Abdomen soft nontender without masses guarding rebound.  All incisions are healing nicely.  No evidence of infection or cellulitis.  Several sutures remain in her infraumbilical incision and her right suprapubic incision.  These are causing an inflammatory response and I removed them.  Assessment/Plan:  28 y.o. [id number] with normal postoperative visit.  Several sutures in her incision remain and are causing irritation.  These were removed.  I anticipate her incisions will quickly heal.  No evidence of infection or other complications.  I again reviewed surgery with her.  Her surgical history on epic is incorrect listing bilateral salpingectomy and NovaSure endometrial ablation.  This was incorrectly placed by the nursing staff intraoperatively.  We are in the process of having this removed.  Her surgery was a laparoscopic paratubal cyst removal.    Belinda Block Chloe Ion MD, 8:17 AM 11/24/2018

## 2018-11-24 NOTE — Patient Instructions (Signed)
Follow-up if any concerns or issues with your incisions

## 2018-12-05 ENCOUNTER — Encounter: Payer: Self-pay | Admitting: *Deleted

## 2018-12-05 ENCOUNTER — Encounter (HOSPITAL_BASED_OUTPATIENT_CLINIC_OR_DEPARTMENT_OTHER): Payer: Self-pay | Admitting: Gynecology

## 2018-12-05 NOTE — Progress Notes (Signed)
Was attempting to correct surgeries through the abstract encounter. KW CMA

## 2019-01-22 ENCOUNTER — Encounter (HOSPITAL_BASED_OUTPATIENT_CLINIC_OR_DEPARTMENT_OTHER): Payer: Self-pay | Admitting: Gynecology

## 2019-01-22 ENCOUNTER — Telehealth: Payer: Self-pay

## 2019-01-22 NOTE — Telephone Encounter (Signed)
Copied from [place] [phone]. Topic: Quick Communication - See Telephone Encounter >> Jan 22, 2019  9:46 AM Loma Boston wrote: CRM for notification. See Telephone encounter for: 01/22/19. Dr Alroy Dust / [facility] has sent over a pt file for sleep apnea, contact wanted by pt [phone]

## 2019-01-24 ENCOUNTER — Encounter (HOSPITAL_BASED_OUTPATIENT_CLINIC_OR_DEPARTMENT_OTHER): Payer: Self-pay | Admitting: Gynecology

## 2019-01-24 NOTE — Telephone Encounter (Signed)
Pt scheduled for a virtual visit on 01/26/2019

## 2019-01-25 ENCOUNTER — Encounter: Payer: Self-pay | Admitting: Family Medicine

## 2019-01-25 ENCOUNTER — Encounter: Payer: Self-pay | Admitting: *Deleted

## 2019-01-25 NOTE — Progress Notes (Signed)
Verifying that surgeries from 10/17/18 have been corrected.   KW CMA

## 2019-01-26 ENCOUNTER — Encounter: Payer: Self-pay | Admitting: *Deleted

## 2019-01-26 ENCOUNTER — Telehealth (INDEPENDENT_AMBULATORY_CARE_PROVIDER_SITE_OTHER): Payer: BC Managed Care – PPO | Admitting: Family Medicine

## 2019-01-26 DIAGNOSIS — R0683 Snoring: Secondary | ICD-10-CM

## 2019-01-26 NOTE — Progress Notes (Signed)
Virtual Visit via Video Note  I connected with Chloe Cervantes on 01/26/19 at  1:30 PM EST by a video enabled telemedicine application 2/2 [id number] pandemic and verified that I am speaking with the correct person using two identifiers.  Location patient: home Location provider:work or home office Persons participating in the virtual visit: patient, provider  I discussed the limitations of evaluation and management by telemedicine and the availability of in person appointments. The patient expressed understanding and agreed to proceed.   HPI: Pt was seen by the orthodontist and told that her airway was "compromissed" and she should see her pcp. Pt sent pictures from imaging via mychart.  Pt snores at night.  Pt does not feel rested when wakes up in the am.  Pt may get 6-8 hrs of sleep per night.  Endorses difficulty falling asleep.  Pt often takes naps during the day.  Pt has fallen asleep at a stop light.  Pt endorses losing wt recently.   ROS: See pertinent positives and negatives per HPI.  Past Medical History:  Diagnosis Date  . Complication of anesthesia    hard to wake up from wisdom teeth removal  . Depression   . Endometrial polyp   . GERD (gastroesophageal reflux disease)   . History of gestational diabetes   . History of pregnancy induced hypertension   . IDA (iron deficiency anemia)   . PCOS (polycystic ovarian syndrome)   . Wears glasses     Past Surgical History:  Procedure Laterality Date  . HYSTEROSCOPY W/D&C N/A 10/17/2018   Procedure: DILATION AND CURRETAGE WITH HYSTEROSCOPY;  Surgeon: Anastasio Auerbach, MD;  Location: [place];  Service: Gynecology;  Laterality: N/A;  . LAPAROSCOPY Left 10/17/2018   Procedure: LAPAROSCOPIC  REMOVAL OF PARATUBAL CYST;  Surgeon: Anastasio Auerbach, MD;  Location: [facility];  Service: Gynecology;  Laterality: Left;  . NO PAST SURGERIES    . WISDOM TOOTH EXTRACTION  [age]    Family History  Problem  Relation Age of Onset  . Diabetes Mother   . Hypertension Mother   . Hyperlipidemia Mother     Current Outpatient Medications:  .  Ferrous Sulfate (IRON PO), Take by mouth daily. , Disp: , Rfl:  .  norethindrone-ethinyl estradiol (LOESTRIN) 1-20 MG-MCG tablet, Take 1 tablet by mouth daily. (Patient not taking: Reported on 11/24/2018), Disp: 1 Package, Rfl: 11 .  omeprazole (PRILOSEC) 20 MG capsule, Take 20 mg by mouth as needed., Disp: , Rfl:   EXAM:  VITALS per patient if applicable: RR between 31-54 bpm  GENERAL: alert, oriented, appears well and in no acute distress, yawning at times.  HEENT: atraumatic, conjunctiva clear, no obvious abnormalities on inspection of external nose and ears  NECK: normal movements of the head and neck  LUNGS: on inspection no signs of respiratory distress, breathing rate appears normal, no obvious gross SOB, gasping or wheezing  CV: no obvious cyanosis  MS: moves all visible extremities without noticeable abnormality  PSYCH/NEURO: pleasant and cooperative, no obvious depression or anxiety, speech and thought processing grossly intact  ASSESSMENT AND PLAN:  Discussed the following assessment and plan:  Snores  -stop bang score 4 intermidiate, however considered high risk 2/2 2 yes questions and BMI >35 -encouraged to continue wt loss, try sleeping in a different position, and reducing stress. - Plan: PSG Sleep Study   I discussed the assessment and treatment plan with the patient. The patient was provided an opportunity to ask questions  and all were answered. The patient agreed with the plan and demonstrated an understanding of the instructions.   The patient was advised to call back or seek an in-person evaluation if the symptoms worsen or if the condition fails to improve as anticipated.  Deeann Saint, MD

## 2019-02-26 ENCOUNTER — Other Ambulatory Visit: Payer: Self-pay

## 2019-02-27 ENCOUNTER — Encounter: Payer: Self-pay | Admitting: Women's Health

## 2019-02-27 ENCOUNTER — Ambulatory Visit (INDEPENDENT_AMBULATORY_CARE_PROVIDER_SITE_OTHER): Payer: BC Managed Care – PPO | Admitting: Women's Health

## 2019-02-27 VITALS — BP 118/78

## 2019-02-27 DIAGNOSIS — R6882 Decreased libido: Secondary | ICD-10-CM

## 2019-02-27 DIAGNOSIS — N938 Other specified abnormal uterine and vaginal bleeding: Secondary | ICD-10-CM

## 2019-02-27 DIAGNOSIS — E282 Polycystic ovarian syndrome: Secondary | ICD-10-CM

## 2019-02-27 LAB — PREGNANCY, URINE: Preg Test, Ur: NEGATIVE

## 2019-02-27 MED ORDER — METFORMIN HCL 500 MG PO TABS: 500.0000 mg | ORAL_TABLET | Freq: Two times a day (BID) | ORAL | 2 refills | Status: DC

## 2019-02-27 MED ORDER — MEDROXYPROGESTERONE ACETATE 10 MG PO TABS: 10.0000 mg | ORAL_TABLET | Freq: Every day | ORAL | 0 refills | Status: DC

## 2019-02-27 NOTE — Patient Instructions (Signed)
Negative plain echo you are pregnant having sex once a month or every other

## 2019-02-27 NOTE — Progress Notes (Signed)
29 year old M HF G3, P2 presents with complaint of irregular bleeding for the past 3 weeks.  10/2018 laparoscopy and hysteroscopy benign endometrial polyp, benign 5.6 cm ovarian cyst.  Desires pregnancy, history of PCOS with long periods of amenorrhea.  Reports no cycles since before laparoscopy.  Reports not heavy bleeding but daily.  Denies vaginal discharge with itching or odor.  Also complained of decreased libido last intercourse 1 month ago.  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  Reports good relationship with husband and denies need for counseling.  Has had a problem with anxiety and depression in the past but denies depressive symptoms, on no medication.  Exam: Appears well, obese, abdomen soft, nontender, external genitalia within normal limits, speculum exam scant amount of menses type blood present, cervix without visible lesion or polyp, bimanual no CMT or tenderness with exam. UPT negative  Irregular bleeding for 3 weeks PCOS  Plan: Provera 10 mg p.o. daily instructed to call if bleeding does not stop, reviewed may get a cycle in 2 weeks, reviewed importance of having a monthly cycle in regards to ovulation and desire for pregnancy.  Reviewed importance of increasing frequency of intercourse, reviewed counseling may be helpful.  Options for PCOS discussed, Metformin 500 mg start with 1 tablet daily and increase to twice daily if tolerating well.  Reviewed importance of a low carb/calorie diet, increasing regular exercise to help with weight loss.  Prenatal vitamin daily encouraged.

## 2019-08-19 IMAGING — US US ART/VEN ABD/PELV/SCROTUM DOPPLER LTD
1 series · 13 of 25 positions shown · non-contrast
Comparison: None.

CLINICAL DATA: Left lower quadrant pain for 1 day.

EXAM:
TRANSABDOMINAL AND TRANSVAGINAL ULTRASOUND OF PELVIS
DOPPLER ULTRASOUND OF OVARIES
TECHNIQUE: Both transabdominal and transvaginal ultrasound examinations of the
pelvis were performed. Transabdominal technique was performed for
global imaging of the pelvis including uterus, ovaries, adnexal
regions, and pelvic cul-de-sac.
It was necessary to proceed with endovaginal exam following the
transabdominal exam to visualize the endometrium and ovaries to
better advantage. Color and duplex Doppler ultrasound was utilized
to evaluate blood flow to the ovaries.

[Series 1: us art/ven abd/pelv/scrotum doppler ltd · 0.24mm/px · 76 acquisitions, 13 frames shown]
[im 1/76]
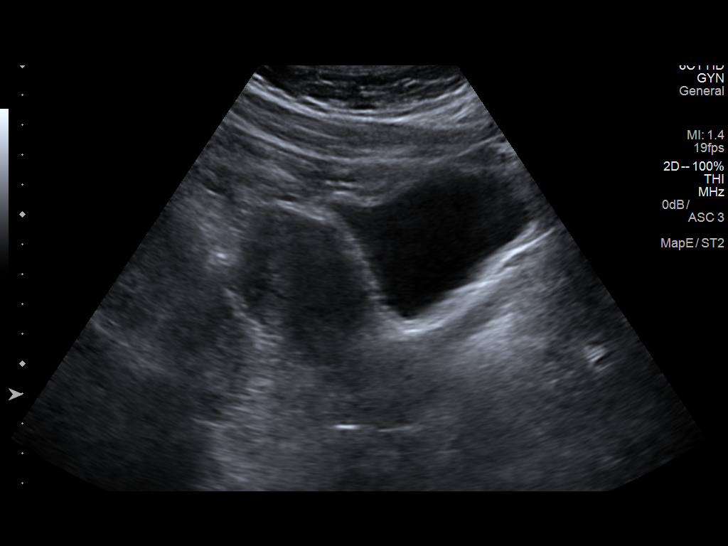
[im 7/76]
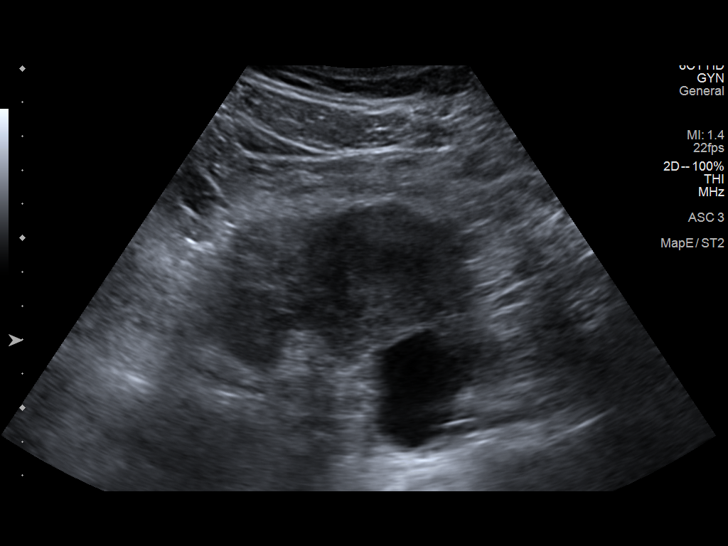
[im 13/76]
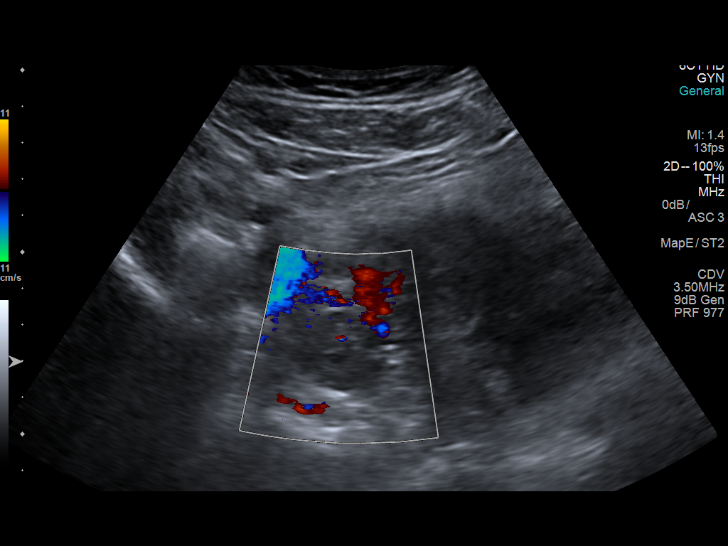
[im 19/76]
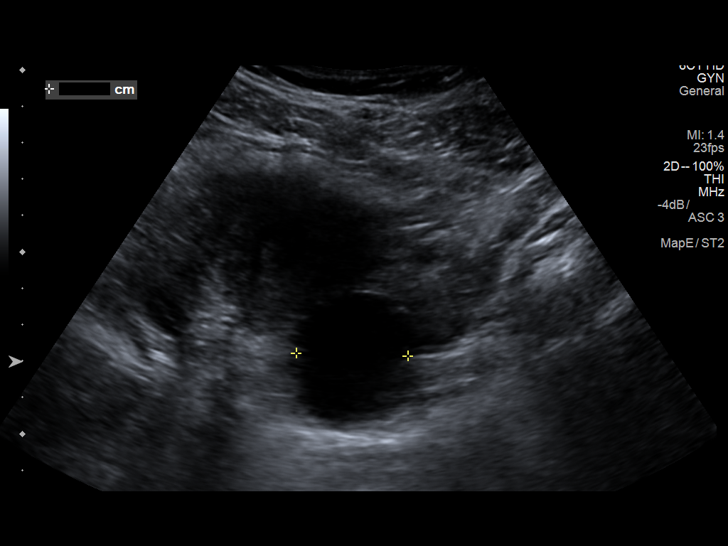
[im 26/76]
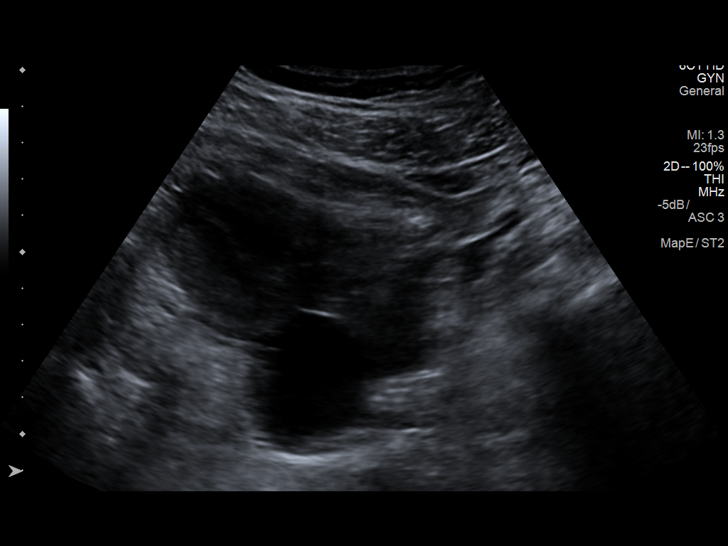
[im 32/76]
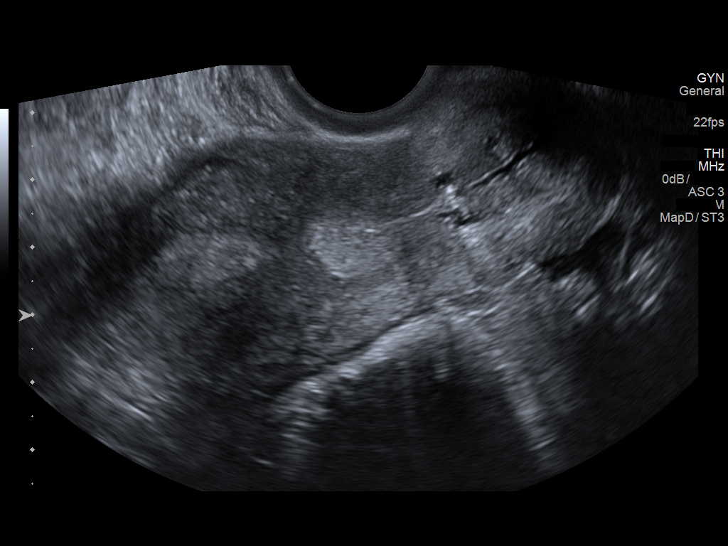
[im 38/76]
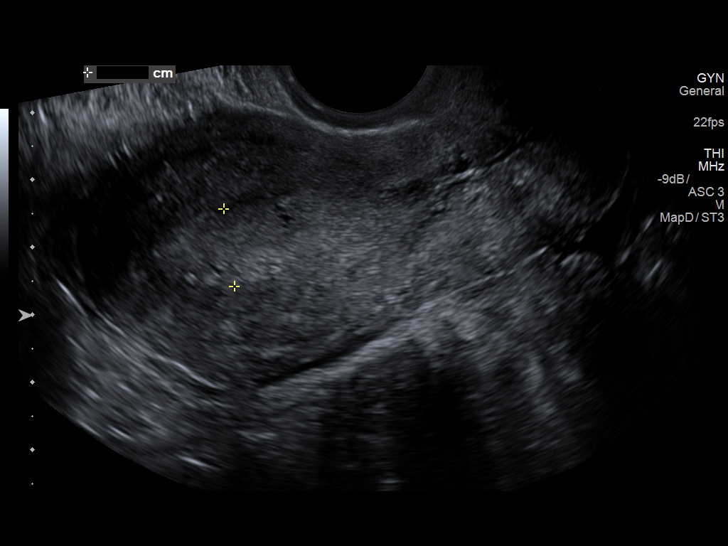
[im 44/76]
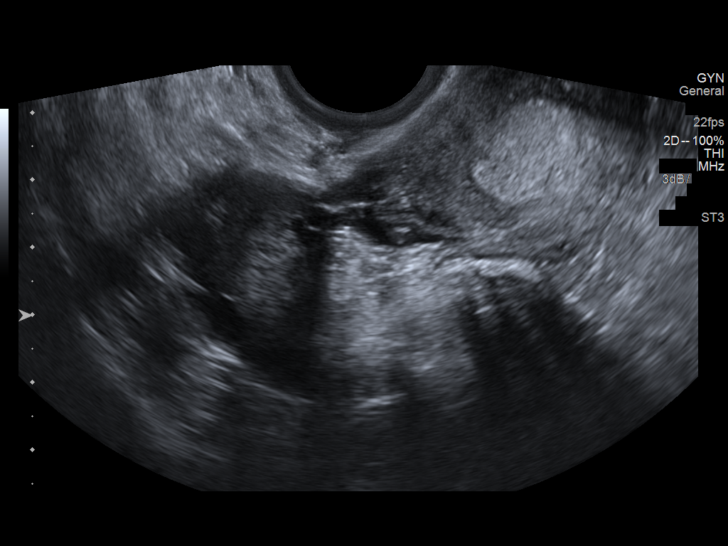
[im 51/76]
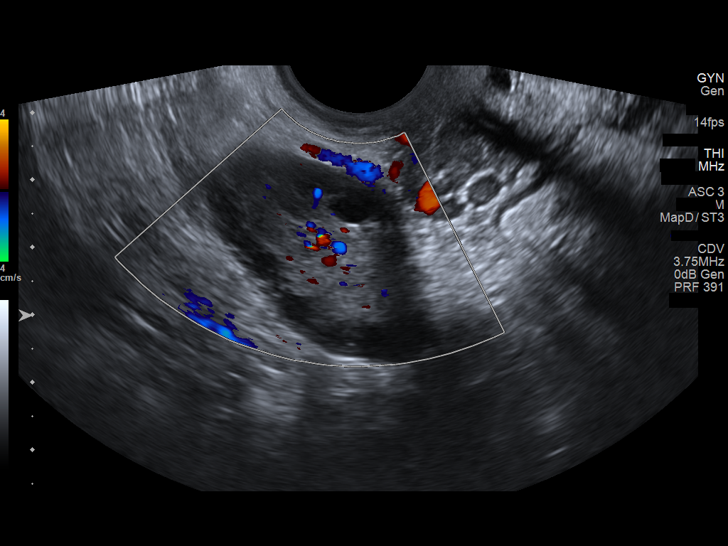
[im 57/76]
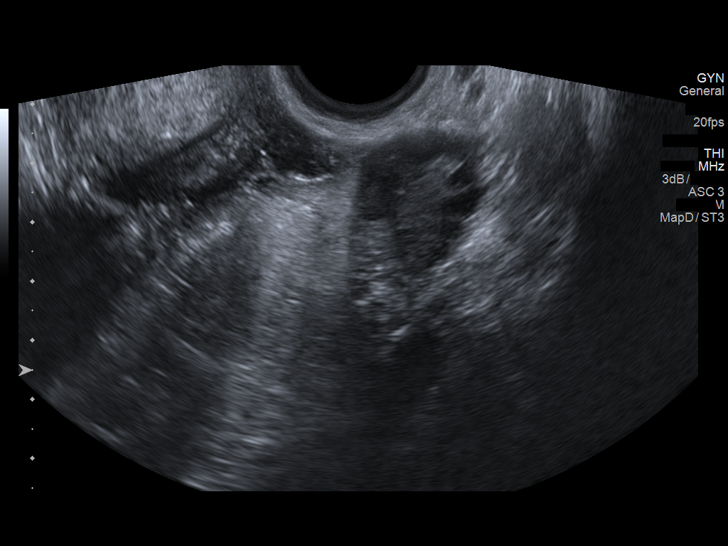
[im 63/76]
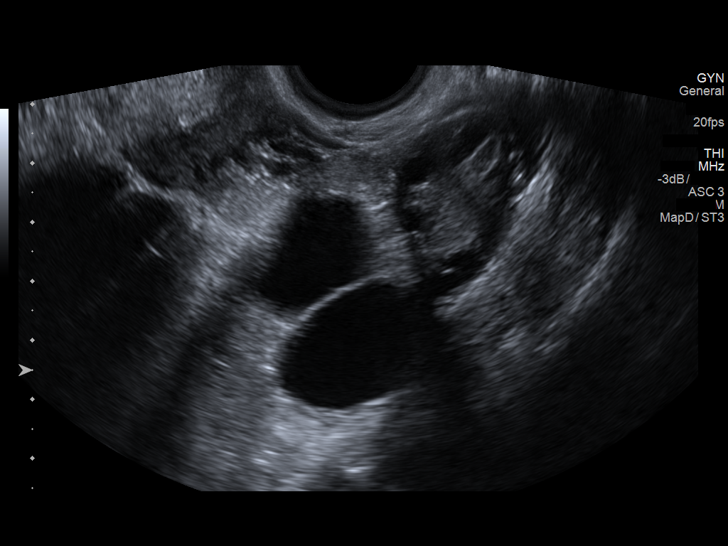
[im 69/76]
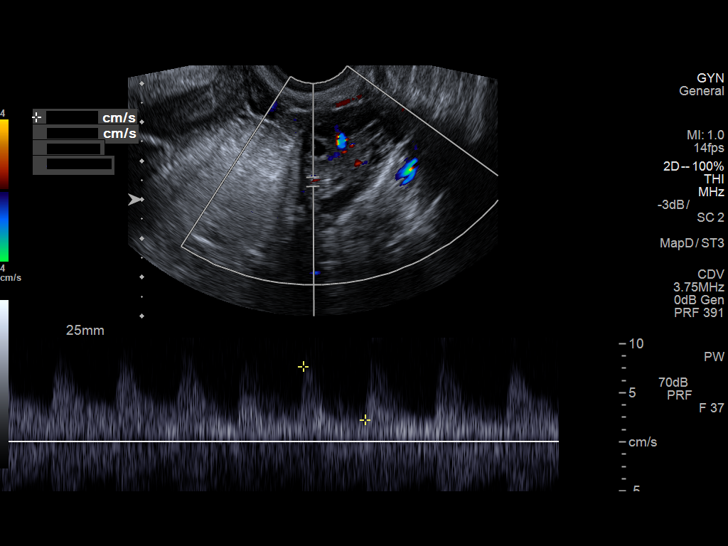
[im 76/76]
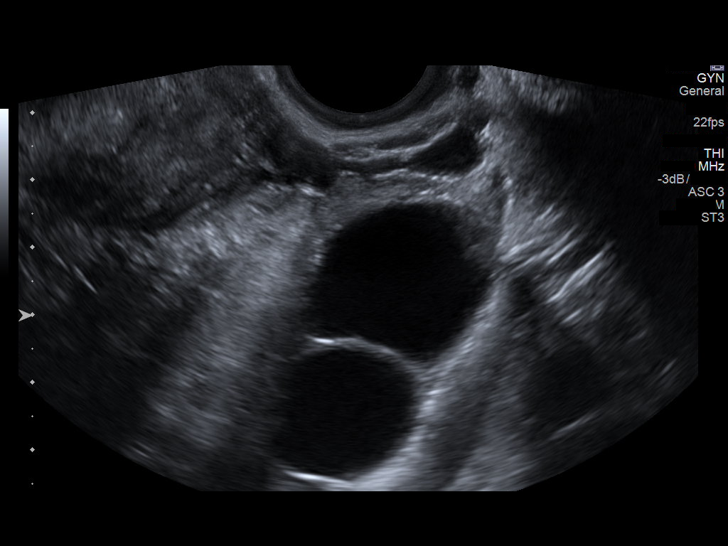

[13 of 25 positions shown; findings below may reference images not displayed]

FINDINGS: Uterus

Measurements: 8.0 x 3.9 x 5.0 cm. No fibroids or other mass
visualized.

Endometrium

Thickness: 12 mm.  No focal abnormality visualized.

Right ovary

Measurements: 4.0 x 2.3 x 2.4 cm. Multiple small follicular cysts
are noted along the ovarian periphery. Consider polycystic ovarian
syndrome in the proper clinical setting. Ovary otherwise
unremarkable. No adnexal masses.

Left ovary

Measurements: 2.8 x 2.5 x 2.5 cm. Multiple small follicular cysts
are noted along the ovarian periphery. Consider polycystic ovarian
syndrome in the proper clinical setting. Ovary otherwise
unremarkable.

There are 2 paraovarian simple appearing cyst adjacent to the left
ovary, largest measuring 4.1 x 3.9 x 3.9 cm and the smaller
measuring 2.6 x 2.2 x 2.5 cm. No other adnexal abnormalities.

Pulsed Doppler evaluation of both ovaries demonstrates normal
low-resistance arterial and venous waveforms.

Other findings

No abnormal free fluid.
IMPRESSION: 1. There are 2 adjacent left para ovarian cysts, both simple in
appearance, largest measuring 4.1 cm. These are likely incidental
findings.
2. Ovaries show small peripherally arranged cysts that raise the
possibility of polycystic ovarian syndrome. Ovaries otherwise
unremarkable with no evidence of torsion.
3. No other abnormalities.

## 2019-09-10 ENCOUNTER — Encounter: Payer: Self-pay | Admitting: Family Medicine

## 2019-09-10 ENCOUNTER — Ambulatory Visit (INDEPENDENT_AMBULATORY_CARE_PROVIDER_SITE_OTHER): Payer: BC Managed Care – PPO | Admitting: Family Medicine

## 2019-09-10 ENCOUNTER — Other Ambulatory Visit: Payer: Self-pay

## 2019-09-10 VITALS — BP 118/82 | HR 73 | Temp 97.9°F | Ht 62.0 in | Wt 215.0 lb

## 2019-09-10 DIAGNOSIS — Z0000 Encounter for general adult medical examination without abnormal findings: Secondary | ICD-10-CM

## 2019-09-10 DIAGNOSIS — M7062 Trochanteric bursitis, left hip: Secondary | ICD-10-CM

## 2019-09-10 DIAGNOSIS — M7061 Trochanteric bursitis, right hip: Secondary | ICD-10-CM | POA: Diagnosis not present

## 2019-09-10 DIAGNOSIS — Z13220 Encounter for screening for lipoid disorders: Secondary | ICD-10-CM | POA: Diagnosis not present

## 2019-09-10 DIAGNOSIS — L719 Rosacea, unspecified: Secondary | ICD-10-CM | POA: Diagnosis not present

## 2019-09-10 DIAGNOSIS — Z131 Encounter for screening for diabetes mellitus: Secondary | ICD-10-CM

## 2019-09-10 MED ORDER — METRONIDAZOLE 0.75 % EX GEL: 1.0000 "application " | Freq: Two times a day (BID) | CUTANEOUS | 0 refills | Status: DC

## 2019-09-10 NOTE — Patient Instructions (Signed)
Preventive Care 21-27 Years Old, Female Preventive care refers to visits with your health care provider and lifestyle choices that can promote health and wellness. This includes:  A yearly physical exam. This may also be called an annual well check.  Regular dental visits and eye exams.  Immunizations.  Screening for certain conditions.  Healthy lifestyle choices, such as eating a healthy diet, getting regular exercise, not using drugs or products that contain nicotine and tobacco, and limiting alcohol use. What can I expect for my preventive care visit? Physical exam Your health care provider will check your:  Height and weight. This may be used to calculate body mass index (BMI), which tells if you are at a healthy weight.  Heart rate and blood pressure.  Skin for abnormal spots. Counseling Your health care provider may ask you questions about your:  Alcohol, tobacco, and drug use.  Emotional well-being.  Home and relationship well-being.  Sexual activity.  Eating habits.  Work and work environment.  Method of birth control.  Menstrual cycle.  Pregnancy history. What immunizations do I need?  Influenza (flu) vaccine  This is recommended every year. Tetanus, diphtheria, and pertussis (Tdap) vaccine  You may need a Td booster every 10 years. Varicella (chickenpox) vaccine  You may need this if you have not been vaccinated. Human papillomavirus (HPV) vaccine  If recommended by your health care provider, you may need three doses over 6 months. Measles, mumps, and rubella (MMR) vaccine  You may need at least one dose of MMR. You may also need a second dose. Meningococcal conjugate (MenACWY) vaccine  One dose is recommended if you are age 19-21 years and a first-year college student living in a residence hall, or if you have one of several medical conditions. You may also need additional booster doses. Pneumococcal conjugate (PCV13) vaccine  You may need  this if you have certain conditions and were not previously vaccinated. Pneumococcal polysaccharide (PPSV23) vaccine  You may need one or two doses if you smoke cigarettes or if you have certain conditions. Hepatitis A vaccine  You may need this if you have certain conditions or if you travel or work in places where you may be exposed to hepatitis A. Hepatitis B vaccine  You may need this if you have certain conditions or if you travel or work in places where you may be exposed to hepatitis B. Haemophilus influenzae type b (Hib) vaccine  You may need this if you have certain conditions. You may receive vaccines as individual doses or as more than one vaccine together in one shot (combination vaccines). Talk with your health care provider about the risks and benefits of combination vaccines. What tests do I need?  Blood tests  Lipid and cholesterol levels. These may be checked every 5 years starting at age 20.  Hepatitis C test.  Hepatitis B test. Screening  Diabetes screening. This is done by checking your blood sugar (glucose) after you have not eaten for a while (fasting).  Sexually transmitted disease (STD) testing.  BRCA-related cancer screening. This may be done if you have a family history of breast, ovarian, tubal, or peritoneal cancers.  Pelvic exam and Pap test. This may be done every 3 years starting at age 21. Starting at age 30, this may be done every 5 years if you have a Pap test in combination with an HPV test. Talk with your health care provider about your test results, treatment options, and if necessary, the need for more tests.   Follow these instructions at home: Eating and drinking   Eat a diet that includes fresh fruits and vegetables, whole grains, lean protein, and low-fat dairy.  Take vitamin and mineral supplements as recommended by your health care provider.  Do not drink alcohol if: ? Your health care provider tells you not to drink. ? You are  pregnant, may be pregnant, or are planning to become pregnant.  If you drink alcohol: ? Limit how much you have to 0-1 drink a day. ? Be aware of how much alcohol is in your drink. In the [place] one drink equals one 12 oz bottle of beer (355 mL), one 5 oz glass of wine (148 mL), or one 1 oz glass of hard liquor (44 mL). Lifestyle  Take daily care of your teeth and gums.  Stay active. Exercise for at least 30 minutes on 5 or more days each week.  Do not use any products that contain nicotine or tobacco, such as cigarettes, e-cigarettes, and chewing tobacco. If you need help quitting, ask your health care provider.  If you are sexually active, practice safe sex. Use a condom or other form of birth control (contraception) in order to prevent pregnancy and STIs (sexually transmitted infections). If you plan to become pregnant, see your health care provider for a preconception visit. What's next?  Visit your health care provider once a year for a well check visit.  Ask your health care provider how often you should have your eyes and teeth checked.  Stay up to date on all vaccines. This information is not intended to replace advice given to you by your health care provider. Make sure you discuss any questions you have with your health care provider. Document Revised: 10/13/2017 Document Reviewed: 10/13/2017 Elsevier Patient Education  2020 [employer].  Hip Bursitis Rehab Ask your health care provider which exercises are safe for you. Do exercises exactly as told by your health care provider and adjust them as directed. It is normal to feel mild stretching, pulling, tightness, or discomfort as you do these exercises. Stop right away if you feel sudden pain or your pain gets worse. Do not begin these exercises until told by your health care provider. Stretching exercise This exercise warms up your muscles and joints and improves the movement and flexibility of your hip. This exercise also  helps to relieve pain and stiffness. Iliotibial band stretch An iliotibial band is a strong band of muscle tissue that runs from the outer side of your hip to the outer side of your thigh and knee. 1. Lie on your side with your left / right leg in the top position. 2. Bend your left / right knee and grab your ankle. Stretch out your bottom arm to help you balance. 3. Slowly bring your knee back so your thigh is behind your body. 4. Slowly lower your knee toward the floor until you feel a gentle stretch on the outside of your left / right thigh. If you do not feel a stretch and your knee will not fall farther, place the heel of your other foot on top of your knee and pull your knee down toward the floor with your foot. 5. Hold this position for __________ seconds. 6. Slowly return to the starting position. Repeat __________ times. Complete this exercise __________ times a day. Strengthening exercises These exercises build strength and endurance in your hip and pelvis. Endurance is the ability to use your muscles for a long time, even after they get  tired. Bridge This exercise strengthens the muscles that move your thigh backward (hip extensors). 1. Lie on your back on a firm surface with your knees bent and your feet flat on the floor. 2. Tighten your buttocks muscles and lift your buttocks off the floor until your trunk is level with your thighs. ? Do not arch your back. ? You should feel the muscles working in your buttocks and the back of your thighs. If you do not feel these muscles, slide your feet 1-2 inches (2.5-5 cm) farther away from your buttocks. ? If this exercise is too easy, try doing it with your arms crossed over your chest. 3. Hold this position for __________ seconds. 4. Slowly lower your hips to the starting position. 5. Let your muscles relax completely after each repetition. Repeat __________ times. Complete this exercise __________ times a day. Squats This exercise  strengthens the muscles in front of your thigh and knee (quadriceps). 1. Stand in front of a table, with your feet and knees pointing straight ahead. You may rest your hands on the table for balance but not for support. 2. Slowly bend your knees and lower your hips like you are going to sit in a chair. ? Keep your weight over your heels, not over your toes. ? Keep your lower legs upright so they are parallel with the table legs. ? Do not let your hips go lower than your knees. ? Do not bend lower than told by your health care provider. ? If your hip pain increases, do not bend as low. 3. Hold the squat position for __________ seconds. 4. Slowly push with your legs to return to standing. Do not use your hands to pull yourself to standing. Repeat __________ times. Complete this exercise __________ times a day. Hip hike 1. Stand sideways on a bottom step. Stand on your left / right leg with your other foot unsupported next to the step. You can hold on to the railing or wall for balance if needed. 2. Keep your knees straight and your torso square. Then lift your left / right hip up toward the ceiling. 3. Hold this position for __________ seconds. 4. Slowly let your left / right hip lower toward the floor, past the starting position. Your foot should get closer to the floor. Do not lean or bend your knees. Repeat __________ times. Complete this exercise __________ times a day. Single leg stand 1. Without shoes, stand near a railing or in a doorway. You may hold on to the railing or door frame as needed for balance. 2. Squeeze your left / right buttock muscles, then lift up your other foot. ? Do not let your left / right hip push out to the side. ? It is helpful to stand in front of a mirror for this exercise so you can watch your hip. 3. Hold this position for __________ seconds. Repeat __________ times. Complete this exercise __________ times a day. This information is not intended to replace  advice given to you by your health care provider. Make sure you discuss any questions you have with your health care provider. Document Revised: 05/29/2018 Document Reviewed: 05/29/2018 Elsevier Patient Education  Cope is a long-term (chronic) condition that affects the skin of the face, including the cheeks, nose, forehead, and chin. This condition can also affect the eyes. Rosacea causes blood vessels near the surface of the skin to get bigger (be enlarged), and that makes the skin red. What are  the causes? The cause of this condition is not known. Certain things can make rosacea worse, including:  Hot baths.  Exercise.  Sunlight.  Very hot or cold temperatures.  Hot or spicy foods and drinks.  Drinking alcohol.  Stress.  Taking blood pressure medicine.  Long-term use of topical steroids on the face. What increases the risk? You are more likely to get this condition if you:  Are older than 27 years of age.  Are a woman.  Have light-colored skin (light complexion).  Have a family history of the condition. What are the signs or symptoms?   Redness of the face.  Red bumps or pimples on the face.  A red, enlarged nose.  Blushing easily.  Red lines on the skin.  Irritated, burning, or itchy feeling in the eyes.  Swollen eyelids.  Drainage from the eyes.  Feeling like there is something in your eye. How is this treated? There is no cure for this condition, but treatment can help to control your symptoms. Your doctor may suggest that you see a skin specialist (dermatologist). Treatment may include:  Medicines that are put on the skin or taken by mouth (orally).  Laser treatment to improve how the skin looks.  Surgery. This is rare. Your doctor will also suggest the best way to take care of your skin. Even after your skin gets better, you will likely need to continue treatment to keep your rosacea from coming back. Follow  these instructions at home: Skin care Take care of your skin as told by your doctor. Your doctor may tell you to do these things:  Wash your skin gently two or more times each day.  Use mild soap.  Use a sunscreen or sunblock with SPF 30 or greater.  Use gentle cosmetics that are meant for sensitive skin.  Shave with an electric shaver instead of a blade. Lifestyle  Try to keep track of what foods make this condition worse. Avoid those foods. These may include: ? Spicy foods. ? Seafood. ? Cheese. ? Hot liquids. ? Nuts. ? Chocolate. ? Iodized salt.  Do not drink alcohol.  Avoid very cold or hot temperatures.  Try to reduce your stress. If you need help to do this, talk with your doctor.  When you exercise, do these things to stay cool: ? Limit sun exposure to your face. ? Use a fan. ? Exercise for a shorter time, and exercise more often. General instructions  Take and apply over-the-counter and prescription medicines only as told by your doctor.  If you were prescribed an antibiotic medicine, apply it or take it as told by your doctor. Do not stop using the antibiotic even if your condition improves.  If your eyelids are affected, hold warm compresses on them. Do this as told by your doctor.  Keep all follow-up visits as told by your doctor. This is important. Contact a doctor if:  Your symptoms get worse.  Your symptoms do not improve after 2 months of treatment.  You have new symptoms.  You have any changes in how you see (vision) or you have problems with your eyes, such as redness or itching.  You feel very sad (depressed).  You do not want to eat as much as normal (lose your appetite).  You have trouble focusing your mind (concentrating). Summary  Rosacea is a long-term condition that affects the skin of the face, including the cheeks, nose, forehead, and chin.  Take care of your skin as told by  your doctor.  Take and apply medicines only as told by  your doctor.  Contact a doctor if your symptoms get worse or if you have problems with your eyes. This information is not intended to replace advice given to you by your health care provider. Make sure you discuss any questions you have with your health care provider. Document Revised: 07/06/2017 Document Reviewed: 07/06/2017 Elsevier Patient Education  2020 [employer].

## 2019-09-10 NOTE — Progress Notes (Signed)
Subjective:     Chloe Cervantes is a 29 y.o. female and is here for a comprehensive physical exam. The patient reports problems - leg pain, rash.  Pt notes pain in the legs with laying down and a heavy sensation with getting up/moving around.  Patient denies injury.  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  Wearing supportive shoes to-3 months old.  Denies loss of bowel or bladder.  Sensation has been occuring for a while but has become more frequent.  Patient also notes erythema on face.  Eyes present x2 years.  States her mother has similar rash.  Pt notes erythema worse in the winter.  Last Pap 02/2017. Social History   Socioeconomic History   Marital status: Married    Spouse name: Not on file   Number of children: Not on file   Years of education: Not on file   Highest education level: Not on file  Occupational History   Not on file  Tobacco Use   Smoking status: Current Some Day Smoker    Years: 2.00    Types: Cigarettes, Cigars   Smokeless tobacco: Never Used  Building services engineer Use: Never used  Substance and Sexual Activity   Alcohol use: Yes    Comment: Occasional   Drug use: No   Sexual activity: Yes    Birth control/protection: None    Comment: 1st intercourse 29 yo-More than 5 partners  Other Topics Concern   Not on file  Social History Narrative   Not on file   Social Determinants of Health   Financial Resource Strain:    Difficulty of Paying Living Expenses:   Food Insecurity:    Worried About Programme researcher, broadcasting/film/video in the Last Year:    Barista in the Last Year:   Transportation Needs:    Freight forwarder (Medical):    Lack of Transportation (Non-Medical):   Physical Activity:    Days of Exercise per Week:    Minutes of Exercise per Session:   Stress:    Feeling of Stress :   Social Connections:    Frequency of Communication with Friends and Family:     Frequency of Social Gatherings with Friends and Family:    Attends Religious Services:    Active Member of Clubs or Organizations:    Attends Engineer, structural:    Marital Status:   Intimate Partner Violence:    Fear of Current or Ex-Partner:    Emotionally Abused:    Physically Abused:    Sexually Abused:    Health Maintenance  Topic Date Due   Hepatitis C Screening  Never done   COVID-19 Vaccine (1) Never done   TETANUS/TDAP  Never done   INFLUENZA VACCINE  09/16/2019   PAP-Cervical Cytology Screening  02/22/2020   PAP SMEAR-Modifier  02/22/2020   HIV Screening  Completed    The following portions of the patient's history were reviewed and updated as appropriate: allergies, current medications, past family history, past medical history, past social history, past surgical history and problem list.  Review of Systems Pertinent items noted in HPI and remainder of comprehensive ROS otherwise negative.   Objective:    BP 118/82 (BP Location: Left Arm, Patient Position: Sitting, Cuff Size: Large)    Pulse 73    Temp 97.9 F (36.6 C) (Oral)    Ht 5\' 2"  (1.575 m)  Wt (!) 215 lb (97.5 kg)    LMP 06/15/2019 (Approximate)    SpO2 98%    BMI 39.32 kg/m  General appearance: alert, cooperative and no distress Head: Normocephalic, without obvious abnormality, atraumatic Eyes: conjunctivae/corneas clear. PERRL, EOM's intact. Fundi benign. Ears: normal TM's and external ear canals both ears Nose: Nares normal. Septum midline. Mucosa normal. No drainage or sinus tenderness. Throat: lips, mucosa, and tongue normal; teeth and gums normal Neck: no adenopathy, no carotid bruit, no JVD, supple, symmetrical, trachea midline and thyroid not enlarged, symmetric, no tenderness/mass/nodules Back: No TTP cervical, thoracic, lumbar spine.  TTP of left low thoracic paraspinal muscles.   symmetric, no curvature. ROM normal. No CVA tenderness. Lungs: clear to auscultation  bilaterally Heart: regular rate and rhythm, S1, S2 normal, no murmur, click, rub or gallop Abdomen: soft, non-tender; bowel sounds normal; no masses,  no organomegaly Extremities:TTP of b/l lateral hips.  + log roll on R.  Negative straight leg raise, FADIR, or FABER.   extremities normal, atraumatic, no cyanosis or edema Pulses: 2+ and symmetric Skin: Skin color, texture, turgor normal.  Erythema underneath findings on bilateral cheeks across the nasolabial fold and bridge of nose.  No lesions Lymph nodes: Cervical, supraclavicular, and axillary nodes normal. Neurologic: Alert and oriented X 3, normal strength and tone. Normal symmetric reflexes. Normal coordination and gait    Assessment:    Healthy female exam with hip pain and erythematous rash across cheeks.     Plan:     Anticipatory guidance given including wearing seatbelts, smoke detectors in the home, increasing physical activity, increasing p.o. intake of water and vegetables. -We will obtain labs -Given handout -Pap up-to-date done 02/2017. -Next CPE in 1 year See After Visit Summary for Counseling Recommendations    Rosacea -Given handout - Plan: metroNIDAZOLE (METROGEL) 0.75 % gel  Trochanteric bursitis of both hips -Discussed supportive care -Given handout  -For continued symptoms consider imaging and PT - Plan: CBC with Differential/Platelet, CBC with Differential/Platelet  Screening for cholesterol level  - Plan: Lipid panel, Lipid panel  Screening for diabetes mellitus  - Plan: Hemoglobin A1c, Hemoglobin A1c  Follow-up as needed  Abbe Amsterdam, MD

## 2019-09-11 LAB — CBC WITH DIFFERENTIAL/PLATELET
Absolute Monocytes: 432 cells/uL (ref 200–950)
Basophils Absolute: 50 cells/uL (ref 0–200)
Basophils Relative: 0.7 %
Eosinophils Absolute: 86 cells/uL (ref 15–500)
Eosinophils Relative: 1.2 %
HCT: 36.5 % (ref 35.0–45.0)
Hemoglobin: 11.6 g/dL — ABNORMAL LOW (ref 11.7–15.5)
Lymphs Abs: 3089 cells/uL (ref 850–3900)
MCH: 25.6 pg — ABNORMAL LOW (ref 27.0–33.0)
MCHC: 31.8 g/dL — ABNORMAL LOW (ref 32.0–36.0)
MCV: 80.4 fL (ref 80.0–100.0)
MPV: 11.1 fL (ref 7.5–12.5)
Monocytes Relative: 6 %
Neutro Abs: 3542 cells/uL (ref 1500–7800)
Neutrophils Relative %: 49.2 %
Platelets: 410 10*3/uL — ABNORMAL HIGH (ref 140–400)
RBC: 4.54 10*6/uL (ref 3.80–5.10)
RDW: 15.3 % — ABNORMAL HIGH (ref 11.0–15.0)
Total Lymphocyte: 42.9 %
WBC: 7.2 10*3/uL (ref 3.8–10.8)

## 2019-09-11 LAB — BASIC METABOLIC PANEL
BUN: 11 mg/dL (ref 7–25)
CO2: 26 mmol/L (ref 20–32)
Calcium: 9.1 mg/dL (ref 8.6–10.2)
Chloride: 103 mmol/L (ref 98–110)
Creat: 0.54 mg/dL (ref 0.50–1.10)
Glucose, Bld: 85 mg/dL (ref 65–99)
Potassium: 4.1 mmol/L (ref 3.5–5.3)
Sodium: 137 mmol/L (ref 135–146)

## 2019-09-11 LAB — LIPID PANEL
Cholesterol: 150 mg/dL (ref <200)
HDL: 55 mg/dL (ref >=50)
LDL Cholesterol (Calc): 80 mg/dL (calc)
Non-HDL Cholesterol (Calc): 95 mg/dL (calc) (ref <130)
Total CHOL/HDL Ratio: 2.7 (calc) (ref <5.0)
Triglycerides: 70 mg/dL (ref <150)

## 2019-09-11 LAB — HEMOGLOBIN A1C
Hgb A1c MFr Bld: 5.8 % of total Hgb — ABNORMAL HIGH (ref <5.7)
Mean Plasma Glucose: 120 (calc)
eAG (mmol/L): 6.6 (calc)

## 2019-09-11 LAB — TSH: TSH: 0.75 mIU/L

## 2019-09-11 LAB — T4, FREE: Free T4: 1.1 ng/dL (ref 0.8–1.8)

## 2019-09-12 ENCOUNTER — Encounter: Payer: Self-pay | Admitting: Family Medicine

## 2019-11-09 DIAGNOSIS — J029 Acute pharyngitis, unspecified: Secondary | ICD-10-CM | POA: Diagnosis not present

## 2019-11-09 DIAGNOSIS — Z03818 Encounter for observation for suspected exposure to other biological agents ruled out: Secondary | ICD-10-CM | POA: Diagnosis not present

## 2019-11-09 DIAGNOSIS — R05 Cough: Secondary | ICD-10-CM | POA: Diagnosis not present

## 2019-11-15 DIAGNOSIS — H6692 Otitis media, unspecified, left ear: Secondary | ICD-10-CM | POA: Diagnosis not present

## 2019-11-15 DIAGNOSIS — R05 Cough: Secondary | ICD-10-CM | POA: Diagnosis not present

## 2019-12-27 DIAGNOSIS — Z03818 Encounter for observation for suspected exposure to other biological agents ruled out: Secondary | ICD-10-CM | POA: Diagnosis not present

## 2020-01-15 ENCOUNTER — Other Ambulatory Visit: Payer: Self-pay

## 2020-01-16 ENCOUNTER — Encounter: Payer: Self-pay | Admitting: Family Medicine

## 2020-01-16 ENCOUNTER — Ambulatory Visit (INDEPENDENT_AMBULATORY_CARE_PROVIDER_SITE_OTHER): Payer: BC Managed Care – PPO | Admitting: Family Medicine

## 2020-01-16 VITALS — BP 118/78 | HR 74 | Temp 97.6°F | Wt 217.2 lb

## 2020-01-16 DIAGNOSIS — E282 Polycystic ovarian syndrome: Secondary | ICD-10-CM

## 2020-01-16 DIAGNOSIS — M25551 Pain in right hip: Secondary | ICD-10-CM

## 2020-01-16 DIAGNOSIS — M25552 Pain in left hip: Secondary | ICD-10-CM

## 2020-01-16 DIAGNOSIS — Z8616 Personal history of COVID-19: Secondary | ICD-10-CM

## 2020-01-16 HISTORY — DX: Personal history of COVID-19: Z86.16

## 2020-01-16 NOTE — Patient Instructions (Signed)
Hip Pain The hip is the joint between the upper legs and the lower pelvis. The bones, cartilage, tendons, and muscles of your hip joint support your body and allow you to move around. Hip pain can range from a minor ache to severe pain in one or both of your hips. The pain may be felt on the inside of the hip joint near the groin, or on the outside near the buttocks and upper thigh. You may also have swelling or stiffness in your hip area. Follow these instructions at home: Managing pain, stiffness, and swelling      If directed, put ice on the painful area. To do this: ? Put ice in a plastic bag. ? Place a towel between your skin and the bag. ? Leave the ice on for 20 minutes, 2-3 times a day.  If directed, apply heat to the affected area as often as told by your health care provider. Use the heat source that your health care provider recommends, such as a moist heat pack or a heating pad. ? Place a towel between your skin and the heat source. ? Leave the heat on for 20-30 minutes. ? Remove the heat if your skin turns bright red. This is especially important if you are unable to feel pain, heat, or cold. You may have a greater risk of getting burned. Activity  Do exercises as told by your health care provider.  Avoid activities that cause pain. General instructions   Take over-the-counter and prescription medicines only as told by your health care provider.  Keep a journal of your symptoms. Write down: ? How often you have hip pain. ? The location of your pain. ? What the pain feels like. ? What makes the pain worse.  Sleep with a pillow between your legs on your most comfortable side.  Keep all follow-up visits as told by your health care provider. This is important. Contact a health care provider if:  You cannot put weight on your leg.  Your pain or swelling continues or gets worse after one week.  It gets harder to walk.  You have a fever. Get help right away  if:  You fall.  You have a sudden increase in pain and swelling in your hip.  Your hip is red or swollen or very tender to touch. Summary  Hip pain can range from a minor ache to severe pain in one or both of your hips.  The pain may be felt on the inside of the hip joint near the groin, or on the outside near the buttocks and upper thigh.  Avoid activities that cause pain.  Write down how often you have hip pain, the location of the pain, what makes it worse, and what it feels like. This information is not intended to replace advice given to you by your health care provider. Make sure you discuss any questions you have with your health care provider. Document Revised: 06/19/2018 Document Reviewed: 06/19/2018 Elsevier Patient Education  2020 [employer] -- 

## 2020-01-16 NOTE — Progress Notes (Signed)
Subjective:    Patient ID: Chloe Cervantes, female    DOB: 10/10/1990, 29 y.o.   MRN: [id number]  No chief complaint on file.   HPI Patient was seen today for ongoing concern.  Patient endorses bilateral groin pain becoming worse over the last 1.5-2 months.  Patient endorses hearing pops in bilateral hip. Hip pain worse while at work.  Tylenol does not help.  Patient does not want to take ibuprofen all the time.  Patient also notes numbness and tingling in bilateral legs from thighs down to ankles.  Pt no longer taking metformin for PCOS.  States the OB/GYN physician and NP she was seen both retired.  Patient has an appointment next week with a new NP.  LMP 4 months ago.  Note LLQ discomfort.  States higher up than typical from PCOS.  Patient denies constipation.  Having BMs daily without straining.  Past Medical History:  Diagnosis Date  . Complication of anesthesia    hard to wake up from wisdom teeth removal  . Depression   . Endometrial polyp   . GERD (gastroesophageal reflux disease)   . History of gestational diabetes   . History of pregnancy induced hypertension   . IDA (iron deficiency anemia)   . PCOS (polycystic ovarian syndrome)   . Wears glasses     No Known Allergies  ROS General: Denies fever, chills, night sweats, changes in weight, changes in appetite HEENT: Denies headaches, ear pain, changes in vision, rhinorrhea, sore throat CV: Denies CP, palpitations, SOB, orthopnea Pulm: Denies SOB, cough, wheezing GI: Denies abdominal pain, nausea, vomiting, diarrhea, constipation  +LLQ pain GU: Denies dysuria, hematuria, frequency, vaginal discharge Msk: Denies muscle cramps, joint pains  +b/l hip and groin pain Neuro: Denies weakness, numbness, tingling Skin: Denies rashes, bruising Psych: Denies depression, anxiety, hallucinations     Objective:    Blood pressure 118/78, pulse 74, temperature 97.6 F (36.4 C), temperature source Oral, weight 217 lb 3.2 oz  (98.5 kg), SpO2 98 %.   Gen. Pleasant, well-nourished, in no distress, normal affect   HEENT: [place]/AT, face symmetric, conjunctiva clear, no scleral icterus, PERRLA, EOMI, nares patent without drainage Lungs: no accessory muscle use Cardiovascular: RRR, no m/r/g, no peripheral edema Musculoskeletal: TTP of right lateral hip.  No TTP of cervical, thoracic, lumbar, paraspinal muscles.  Negative logroll.  Positive abduction of bilateral LEs, FADIR, FABER.  B/l straight leg raise causes discomfort in groin. no deformities, no cyanosis or clubbing, normal tone Neuro:  A&Ox3, CN II-XII intact, normal gait Skin:  Warm, no lesions/ rash   Wt Readings from Last 3 Encounters:  01/16/20 217 lb 3.2 oz (98.5 kg)  09/10/19 (!) 215 lb (97.5 kg)  10/17/18 224 lb 6.4 oz (101.8 kg)    Lab Results  Component Value Date   WBC 7.2 09/10/2019   HGB 11.6 (L) 09/10/2019   HCT 36.5 09/10/2019   PLT 410 (H) 09/10/2019   GLUCOSE 85 09/10/2019   CHOL 150 09/10/2019   TRIG 70 09/10/2019   HDL 55 09/10/2019   LDLCALC 80 09/10/2019   ALT 33 10/17/2018   AST 22 10/17/2018   NA 137 09/10/2019   K 4.1 09/10/2019   CL 103 09/10/2019   CREATININE 0.54 09/10/2019   BUN 11 09/10/2019   CO2 26 09/10/2019   TSH 0.75 09/10/2019   HGBA1C 5.8 (H) 09/10/2019    Assessment/Plan:  Hip pain, bilateral - Plan: DG HIPS BILAT WITH PELVIS 3-4 VIEWS  PCOS (polycystic ovarian syndrome) -  Plan: POCT urine pregnancy  Discussed possible causes of bilateral hip pain including arthritis, bursitis, muscle strain, etc. -Discussed obtaining imaging.  Given history of PCOS urine hCG needed.  Patient had to pick up her daughter she states she will return for imaging later on today.  Patient advised to keep upcoming appointment with OB/GYN.  Further recommendations on hip pain based on imaging.  Advised okay to use NSAIDs as needed as long as taken with food.  F/u prn  Abbe Amsterdam, MD

## 2020-01-23 ENCOUNTER — Other Ambulatory Visit (INDEPENDENT_AMBULATORY_CARE_PROVIDER_SITE_OTHER): Payer: BC Managed Care – PPO

## 2020-01-23 ENCOUNTER — Ambulatory Visit (INDEPENDENT_AMBULATORY_CARE_PROVIDER_SITE_OTHER): Payer: BC Managed Care – PPO | Admitting: Nurse Practitioner

## 2020-01-23 ENCOUNTER — Other Ambulatory Visit: Payer: Self-pay

## 2020-01-23 ENCOUNTER — Ambulatory Visit (INDEPENDENT_AMBULATORY_CARE_PROVIDER_SITE_OTHER): Payer: BC Managed Care – PPO

## 2020-01-23 ENCOUNTER — Encounter: Payer: Self-pay | Admitting: Nurse Practitioner

## 2020-01-23 VITALS — BP 122/85

## 2020-01-23 DIAGNOSIS — M25552 Pain in left hip: Secondary | ICD-10-CM

## 2020-01-23 DIAGNOSIS — E282 Polycystic ovarian syndrome: Secondary | ICD-10-CM

## 2020-01-23 DIAGNOSIS — N912 Amenorrhea, unspecified: Secondary | ICD-10-CM

## 2020-01-23 DIAGNOSIS — R1032 Left lower quadrant pain: Secondary | ICD-10-CM | POA: Diagnosis not present

## 2020-01-23 DIAGNOSIS — M160 Bilateral primary osteoarthritis of hip: Secondary | ICD-10-CM | POA: Diagnosis not present

## 2020-01-23 DIAGNOSIS — M25551 Pain in right hip: Secondary | ICD-10-CM

## 2020-01-23 LAB — POCT URINE PREGNANCY: Preg Test, Ur: NEGATIVE

## 2020-01-23 NOTE — Progress Notes (Signed)
   Acute Office Visit  Subjective:    Patient ID: Chloe Cervantes, female    DOB: 05-16-1990, 29 y.o.   MRN: [id number]   HPI 29 y.o. presents today for left lower quadrant abdominal pain that started 1 month ago and she feels it is worsening.  History of left ovarian cyst removal.  She is currently being treated for bilateral bursitis in hips and is not sure if this is just the pain radiating to her abdomen.  History of PCOS, on Metformin but stopped this recently due to fear of side effects and running out of medication.  She is actively trying to conceive and has been trying off-and-on for the past year.  She has irregular cycles and is unsure when her last menstrual cycle was but thinks it was around 4 months ago.  She took a UPT at home today and it was negative.    Review of Systems  Constitutional: Negative.   Gastrointestinal: Positive for abdominal pain. Negative for abdominal distention, constipation, diarrhea, nausea and vomiting.  Genitourinary: Positive for menstrual problem.  Musculoskeletal:       Bilateral hip pain       Objective:    Physical Exam Constitutional:      Appearance: Normal appearance. She is obese.  Abdominal:     Tenderness: There is abdominal tenderness in the left lower quadrant.  Genitourinary:    General: Normal vulva.     Vagina: Normal.     Cervix: Normal.     Comments: Difficult to palpate uterus due to body habitus but no gross masses felt    BP 122/85 (BP Location: Right Arm, Patient Position: Sitting, Cuff Size: Normal)  Wt Readings from Last 3 Encounters:  01/16/20 217 lb 3.2 oz (98.5 kg)  09/10/19 (!) 215 lb (97.5 kg)  10/17/18 224 lb 6.4 oz (101.8 kg)        Assessment & Plan:   Problem List Items Addressed This Visit      Endocrine   PCOS (polycystic ovarian syndrome)   Relevant Orders   US PELVIS TRANSVAGINAL NON-OB (TV ONLY)    Other Visit Diagnoses    Left lower quadrant abdominal pain    -  Primary    Relevant Orders   US PELVIS TRANSVAGINAL NON-OB (TV ONLY)   Amenorrhea       Relevant Orders   US PELVIS TRANSVAGINAL NON-OB (TV ONLY)     Plan: Due to history of left ovarian cyst and current symptoms we will schedule a pelvic ultrasound. We also discussed restarting Metformin in conjunction with ovulation induction with Clomid. If unsuccessful after 3 months we will send referral to reproductive endocrinologist. She is agreeable and we will have more in-depth discussions at ultrasound visit based on results.       Olivia Mackie [facility], 7:39 AM 01/24/2020

## 2020-01-28 ENCOUNTER — Other Ambulatory Visit: Payer: Self-pay

## 2020-01-28 DIAGNOSIS — M160 Bilateral primary osteoarthritis of hip: Secondary | ICD-10-CM

## 2020-02-05 ENCOUNTER — Ambulatory Visit (INDEPENDENT_AMBULATORY_CARE_PROVIDER_SITE_OTHER): Payer: BC Managed Care – PPO | Admitting: Orthopaedic Surgery

## 2020-02-05 ENCOUNTER — Other Ambulatory Visit: Payer: Self-pay

## 2020-02-05 ENCOUNTER — Encounter: Payer: Self-pay | Admitting: Orthopaedic Surgery

## 2020-02-05 VITALS — Ht 61.5 in | Wt 216.6 lb

## 2020-02-05 DIAGNOSIS — M25859 Other specified joint disorders, unspecified hip: Secondary | ICD-10-CM

## 2020-02-05 NOTE — Progress Notes (Signed)
Office Visit Note   Patient: Chloe Cervantes           Date of Birth: [birth year]           MRN: [id number] Visit Date: 02/05/2020              Requested by: Deeann Saint, MD [address] [place],  [place] [address] PCP: Deeann Saint, MD   Assessment & Plan: Visit Diagnoses:  1. Femoral acetabular impingement     Plan: Impression is bilateral femoral acetabular impingement pincer type.  Based on these findings we will need to obtain an MR arthrogram to evaluate for labral tears.  She would like to hold off on cortisone injections and medications for now until we get the MRI.  She will follow up with [place] after the MRI.  Follow-Up Instructions: Return if symptoms worsen or fail to improve.   Orders:  No orders of the defined types were placed in this encounter.  No orders of the defined types were placed in this encounter.     Procedures: No procedures performed   Clinical Data: No additional findings.   Subjective: Chief Complaint  Patient presents with  . Right Hip - Pain  . Left Hip - Pain    Chloe Cervantes is a 29 year old female works as a Clinical biochemist at [employer] who comes in for bilateral hip and groin pain for about 6 to 12 months.  Recently the pain has been increasing.  Denies any injuries.  Currently not taking any medications.  She endorses pain with activity that radiates to the knees.  Pain with sex.  Denies any back pain or radicular symptoms.   Review of Systems  Constitutional: Negative.   HENT: Negative.   Eyes: Negative.   Respiratory: Negative.   Cardiovascular: Negative.   Endocrine: Negative.   Musculoskeletal: Negative.   Neurological: Negative.   Hematological: Negative.   Psychiatric/Behavioral: Negative.   All other systems reviewed and are negative.    Objective: Vital Signs: Ht 5' 1.5" (1.562 m)   Wt 216 lb 9.6 oz (98.2 kg)   BMI 40.26 kg/m   Physical Exam Vitals and nursing note reviewed.  Constitutional:       Appearance: She is well-developed and well-nourished.  HENT:     Head: Normocephalic and atraumatic.  Eyes:     Extraocular Movements: EOM normal.  Pulmonary:     Effort: Pulmonary effort is normal.  Abdominal:     Palpations: Abdomen is soft.  Musculoskeletal:     Cervical back: Neck supple.  Skin:    General: Skin is warm.     Capillary Refill: Capillary refill takes less than 2 seconds.  Neurological:     Mental Status: She is alert and oriented to person, place, and time.  Psychiatric:        Mood and Affect: Mood and affect normal.        Behavior: Behavior normal.        Thought Content: Thought content normal.        Judgment: Judgment normal.     Ortho Exam Bilateral hips show painful logroll and positive FADIR.  Trochanteric bursa's are nontender.  Range of motion elicits pain.  Mild limitation range of motion.  Negative sciatic tension signs. Specialty Comments:  No specialty comments available.  Imaging: No results found.   PMFS History: Patient Active Problem List   Diagnosis Date Noted  . PCOS (polycystic ovarian syndrome) 04/07/2018  . Anxiety and  depression 04/07/2018  . Iron deficiency anemia 04/07/2018  . Abnormal uterine bleeding (AUB) 01/21/2017   Past Medical History:  Diagnosis Date  . Complication of anesthesia    hard to wake up from wisdom teeth removal  . Depression   . Endometrial polyp   . GERD (gastroesophageal reflux disease)   . History of gestational diabetes   . History of pregnancy induced hypertension   . IDA (iron deficiency anemia)   . PCOS (polycystic ovarian syndrome)   . Wears glasses     Family History  Problem Relation Age of Onset  . Diabetes Mother   . Hypertension Mother   . Hyperlipidemia Mother     Past Surgical History:  Procedure Laterality Date  . HYSTEROSCOPY WITH D & C N/A 10/17/2018   Procedure: DILATION AND CURRETAGE WITH HYSTEROSCOPY;  Surgeon: Dara Lords, MD;  Location: [facility];  Service: Gynecology;  Laterality: N/A;  . LAPAROSCOPY Left 10/17/2018   Procedure: LAPAROSCOPIC  REMOVAL OF PARATUBAL CYST;  Surgeon: Dara Lords, MD;  Location: [facility];  Service: Gynecology;  Laterality: Left;  . NO PAST SURGERIES    . WISDOM TOOTH EXTRACTION  teen   Social History   Occupational History  . Not on file  Tobacco Use  . Smoking status: Current Some Day Smoker    Years: 2.00    Types: Cigarettes, Cigars  . Smokeless tobacco: Never Used  Vaping Use  . Vaping Use: Never used  Substance and Sexual Activity  . Alcohol use: Yes    Comment: Occasional  . Drug use: No  . Sexual activity: Yes    Birth control/protection: None    Comment: 1st intercourse 29 yo-More than 5 partners

## 2020-02-06 ENCOUNTER — Other Ambulatory Visit: Payer: Self-pay

## 2020-02-06 DIAGNOSIS — M25552 Pain in left hip: Secondary | ICD-10-CM

## 2020-02-06 DIAGNOSIS — M25551 Pain in right hip: Secondary | ICD-10-CM

## 2020-02-13 DIAGNOSIS — U071 COVID-19: Secondary | ICD-10-CM | POA: Diagnosis not present

## 2020-02-13 DIAGNOSIS — Z20822 Contact with and (suspected) exposure to covid-19: Secondary | ICD-10-CM | POA: Diagnosis not present

## 2020-02-19 ENCOUNTER — Ambulatory Visit: Payer: BC Managed Care – PPO

## 2020-02-20 DIAGNOSIS — U071 COVID-19: Secondary | ICD-10-CM | POA: Diagnosis not present

## 2020-02-21 ENCOUNTER — Ambulatory Visit: Payer: BC Managed Care – PPO | Admitting: Nurse Practitioner

## 2020-02-21 ENCOUNTER — Other Ambulatory Visit: Payer: BC Managed Care – PPO

## 2020-02-29 DIAGNOSIS — M25859 Other specified joint disorders, unspecified hip: Secondary | ICD-10-CM | POA: Insufficient documentation

## 2020-03-10 ENCOUNTER — Telehealth: Payer: Self-pay

## 2020-03-10 ENCOUNTER — Other Ambulatory Visit: Payer: Self-pay | Admitting: Nurse Practitioner

## 2020-03-10 DIAGNOSIS — N939 Abnormal uterine and vaginal bleeding, unspecified: Secondary | ICD-10-CM

## 2020-03-10 MED ORDER — MEGESTROL ACETATE 40 MG PO TABS
40.0000 mg | ORAL_TABLET | Freq: Two times a day (BID) | ORAL | 0 refills | Status: DC
Start: 2020-03-10 — End: 2020-03-18

## 2020-03-10 NOTE — Telephone Encounter (Signed)
Because of prolonged heavy bleeding I would like her to come for ultrasound sometime this week (but not tomorrow since she is currently having heavy bleeding). I will send in Megace to start today to slow bleeding and I recommend her rescheduling for another day this week if she is able to do so. Some bleeding is okay, but heavy bleeding may make it difficult to get good pictures.

## 2020-03-10 NOTE — Telephone Encounter (Signed)
Staff message sent to [facility] to r/s u/s appt.

## 2020-03-10 NOTE — Telephone Encounter (Signed)
Patient is scheduled for u/s tomorrow.  She wanted you to know that she has bled like a period every day since she saw you on 01/23/20.  She said a few days after visit she started passing large clots every time she goes to the bathroom. That has continued since then. Flow has been like a period but in last few days has gotten even heavier.  She questions can she still have u/s tomorrow.

## 2020-03-10 NOTE — Telephone Encounter (Signed)
Patient informed. She is only off on Tuesday and Weds. She wants to wait and schedule next week when bleeding will hopefully be improved.

## 2020-03-11 ENCOUNTER — Other Ambulatory Visit: Payer: BC Managed Care – PPO

## 2020-03-11 ENCOUNTER — Ambulatory Visit: Payer: BC Managed Care – PPO | Admitting: Nurse Practitioner

## 2020-03-12 ENCOUNTER — Other Ambulatory Visit: Payer: Self-pay | Admitting: Orthopaedic Surgery

## 2020-03-12 ENCOUNTER — Ambulatory Visit
Admission: RE | Admit: 2020-03-12 | Discharge: 2020-03-12 | Disposition: A | Discharge status: Home / Self Care | Payer: BC Managed Care – PPO | Source: Ambulatory Visit | Attending: Orthopaedic Surgery | Admitting: Orthopaedic Surgery

## 2020-03-12 DIAGNOSIS — M25552 Pain in left hip: Secondary | ICD-10-CM

## 2020-03-12 DIAGNOSIS — M25551 Pain in right hip: Secondary | ICD-10-CM

## 2020-03-12 DIAGNOSIS — S73191A Other sprain of right hip, initial encounter: Secondary | ICD-10-CM | POA: Diagnosis not present

## 2020-03-12 DIAGNOSIS — S73192A Other sprain of left hip, initial encounter: Secondary | ICD-10-CM | POA: Diagnosis not present

## 2020-03-12 DIAGNOSIS — G8929 Other chronic pain: Secondary | ICD-10-CM | POA: Diagnosis not present

## 2020-03-12 MED ORDER — IOPAMIDOL (ISOVUE-M 200) INJECTION 41%
13.0000 mL | Freq: Once | INTRAMUSCULAR | Status: AC
  Administered 2020-03-12: 13 mL via INTRA_ARTICULAR

## 2020-03-14 ENCOUNTER — Ambulatory Visit (INDEPENDENT_AMBULATORY_CARE_PROVIDER_SITE_OTHER): Payer: BC Managed Care – PPO | Admitting: Orthopaedic Surgery

## 2020-03-14 ENCOUNTER — Encounter: Payer: Self-pay | Admitting: Orthopaedic Surgery

## 2020-03-14 DIAGNOSIS — M25551 Pain in right hip: Secondary | ICD-10-CM

## 2020-03-14 DIAGNOSIS — M25552 Pain in left hip: Secondary | ICD-10-CM

## 2020-03-14 NOTE — Telephone Encounter (Signed)
Yes refer to Duwayne Heck at [facility]

## 2020-03-14 NOTE — Progress Notes (Signed)
Office Visit Note   Patient: Chloe Cervantes           Date of Birth: [birth year]           MRN: [id number] Visit Date: 03/14/2020              Requested by: Deeann Saint, MD [address] [place],  [place] [address] PCP: Deeann Saint, MD   Assessment & Plan: Visit Diagnoses:  1. Bilateral hip pain     Plan: Impression is bilateral hip femoral acetabular impingement syndrome with extensive superior labral tears.  We have discussed treatment to include cortisone injection.  The patient would like to hold off for now.  She will let us know of she would like to proceed in the near future and will refer her to Dr. Prince Rome for this.  Patient later called and asked for a referral to Dr. Duwayne Heck for surgical consultation.  Follow-Up Instructions: Return if symptoms worsen or fail to improve.   Orders:  No orders of the defined types were placed in this encounter.  No orders of the defined types were placed in this encounter.     Procedures: No procedures performed   Clinical Data: No additional findings.   Subjective: Chief Complaint  Patient presents with  . Right Hip - Follow-up    HPI patient is a pleasant 30 year old female who comes in today to discuss MRI arthrogram results of both hips.  She has had pain to both hips for the past 1 to 2 years.  She has learned to live with this despite pain with activity.  Recent MR arthrogram of both hips show extensive tearing of the superior labrum as well as femoral acetabular impingement syndrome.  No previous cortisone injection.     Objective: Vital Signs: There were no vitals taken for this visit.    Ortho Exam stable hip exam  Specialty Comments:  No specialty comments available.  Imaging: No new imaging   PMFS History: Patient Active Problem List   Diagnosis Date Noted  . PCOS (polycystic ovarian syndrome) 04/07/2018  . Anxiety and depression 04/07/2018  . Iron deficiency anemia  04/07/2018  . Abnormal uterine bleeding (AUB) 01/21/2017   Past Medical History:  Diagnosis Date  . Complication of anesthesia    hard to wake up from wisdom teeth removal  . Depression   . Endometrial polyp   . GERD (gastroesophageal reflux disease)   . History of gestational diabetes   . History of pregnancy induced hypertension   . IDA (iron deficiency anemia)   . PCOS (polycystic ovarian syndrome)   . Wears glasses     Family History  Problem Relation Age of Onset  . Diabetes Mother   . Hypertension Mother   . Hyperlipidemia Mother     Past Surgical History:  Procedure Laterality Date  . HYSTEROSCOPY WITH D & C N/A 10/17/2018   Procedure: DILATION AND CURRETAGE WITH HYSTEROSCOPY;  Surgeon: Dara Lords, MD;  Location: [facility];  Service: Gynecology;  Laterality: N/A;  . LAPAROSCOPY Left 10/17/2018   Procedure: LAPAROSCOPIC  REMOVAL OF PARATUBAL CYST;  Surgeon: Dara Lords, MD;  Location: [facility];  Service: Gynecology;  Laterality: Left;  . NO PAST SURGERIES    . WISDOM TOOTH EXTRACTION  teen   Social History   Occupational History  . Not on file  Tobacco Use  . Smoking status: Current Some Day Smoker    Years: 2.00  Types: Cigarettes, Cigars  . Smokeless tobacco: Never Used  Vaping Use  . Vaping Use: Never used  Substance and Sexual Activity  . Alcohol use: Yes    Comment: Occasional  . Drug use: No  . Sexual activity: Yes    Birth control/protection: None    Comment: 1st intercourse 30 yo-More than 5 partners

## 2020-03-18 ENCOUNTER — Ambulatory Visit (INDEPENDENT_AMBULATORY_CARE_PROVIDER_SITE_OTHER): Payer: BC Managed Care – PPO

## 2020-03-18 ENCOUNTER — Telehealth: Payer: Self-pay

## 2020-03-18 ENCOUNTER — Encounter: Payer: Self-pay | Admitting: Nurse Practitioner

## 2020-03-18 ENCOUNTER — Other Ambulatory Visit: Payer: Self-pay

## 2020-03-18 ENCOUNTER — Ambulatory Visit (INDEPENDENT_AMBULATORY_CARE_PROVIDER_SITE_OTHER): Payer: BC Managed Care – PPO | Admitting: Nurse Practitioner

## 2020-03-18 VITALS — BP 124/80

## 2020-03-18 DIAGNOSIS — N912 Amenorrhea, unspecified: Secondary | ICD-10-CM

## 2020-03-18 DIAGNOSIS — R9389 Abnormal findings on diagnostic imaging of other specified body structures: Secondary | ICD-10-CM

## 2020-03-18 DIAGNOSIS — N939 Abnormal uterine and vaginal bleeding, unspecified: Secondary | ICD-10-CM

## 2020-03-18 DIAGNOSIS — E282 Polycystic ovarian syndrome: Secondary | ICD-10-CM | POA: Diagnosis not present

## 2020-03-18 DIAGNOSIS — R1032 Left lower quadrant pain: Secondary | ICD-10-CM

## 2020-03-18 MED ORDER — MEGESTROL ACETATE 40 MG PO TABS: 40.0000 mg | ORAL_TABLET | Freq: Two times a day (BID) | ORAL | 1 refills | Status: DC

## 2020-03-18 MED ORDER — METFORMIN HCL 500 MG PO TABS: 500.0000 mg | ORAL_TABLET | Freq: Two times a day (BID) | ORAL | 2 refills | Status: DC

## 2020-03-18 NOTE — Progress Notes (Signed)
History: 30 year old [id number] presents for ultrasound follow up. She was seen by me on 01/23/2020 for left lower quadrant pain that started 1 month prior - this pain has resolved. History of ovarian cyst removal that was found incidentally when patient was having endometrial polyps removed (none found during procedure). PCOS with irregular cycles. Recommended starting Metformin but she has not started yet and plans to start tomorrow. LMP 01/24/2020 that was very heavy with clots and was started on megace after 2 weeks of bleeding. Bleeding stops while taking Megace but returns within 1-2 days of stopping. She has been trying to conceive off and on for 1 year. She conceived her 2 daughters naturally.   Exam: Appears well. No abdominal pain or vaginal bleeding today Ultrasound: Anteverted uterus, arcuate in shape, no myometrial masses.  Endometrium is thickened - 2 cm, it is somewhat vascular, no definite masses are seen. Both ovaries have a PCOS appearance with perfusion bilaterally.  No adnexal masses, no free fluid.  Assessment: PCOS (polycystic ovarian syndrome) Abnormal uterine bleeding Thickened endometrium  Plan: We discussed ultrasound findings of thickened endometrium with some vascularity that may indicate polyps. Explained that lining is thicker than expected after just having a heavy cycle so an endometrial biopsy may be indicated. We will schedule [facility] and endometrial biopsy with Dr. Oscar La (was seeing Penni Bombard previously and this plan was discussed with him). Continue Megace twice daily to control bleeding and prevent anemia, which she already has a history of. Start metformin to help with cycle regulation. We will discuss plans to conceive and initiating ovulation induction medications after investigation of today's ultrasound findings. She is agreeable to plan and all questions answered.

## 2020-03-18 NOTE — Patient Instructions (Signed)
Endometrial Biopsy  An endometrial biopsy is a procedure to remove tissue samples from the endometrium, which is the lining of the uterus. The tissue that is removed can then be checked under a microscope for disease. This procedure is used to diagnose conditions such as endometrial cancer, endometrial tuberculosis, polyps, or other inflammatory conditions. This procedure may also be used to investigate uterine bleeding to determine where you are in your menstrual cycle or how your hormone levels are affecting the lining of the uterus. Tell a health care provider about:  Any allergies you have.  All medicines you are taking, including vitamins, herbs, eye drops, creams, and over-the-counter medicines.  Any problems you or family members have had with anesthetic medicines.  Any blood disorders you have.  Any surgeries you have had.  Any medical conditions you have.  Whether you are pregnant or may be pregnant. What are the risks? Generally, this is a safe procedure. However, problems may occur, including:  Bleeding.  Pelvic infection.  Puncture of the wall of the uterus with the biopsy device (rare).  Allergic reactions to medicines. What happens before the procedure?  Keep a record of your menstrual cycles as told by your health care provider. You may need to schedule your procedure for a specific time in your cycle.  You may want to bring a sanitary pad to wear after the procedure.  Plan to have someone take you home from the hospital or clinic.  Ask your health care provider about: ? Changing or stopping your regular medicines. This is especially important if you are taking diabetes medicines, arthritis medicines, or blood thinners. ? Taking medicines such as aspirin and ibuprofen. These medicines can thin your blood. Do not take these medicines unless your health care provider tells you to take them. ? Taking over-the-counter medicines, vitamins, herbs, and  supplements. What happens during the procedure?  You will lie on an exam table with your feet and legs supported as in a pelvic exam.  Your health care provider will insert an instrument (speculum) into your vagina to see your cervix.  Your cervix will be cleansed with an antiseptic solution.  A medicine (local anesthetic) will be used to numb the cervix.  A forceps instrument (tenaculum) will be used to hold your cervix steady for the biopsy.  A thin, rod-like instrument (uterine sound) will be inserted through your cervix to determine the length of your uterus and the location where the biopsy sample will be removed.  A thin, flexible tube (catheter) will be inserted through your cervix and into the uterus. The catheter will be used to collect the biopsy sample from your endometrial tissue.  The catheter and speculum will then be removed, and the tissue sample will be sent to a lab for examination. The procedure may vary among health care providers and hospitals. What can I expect after procedure?  You will rest in a recovery area until you are ready to go home.  You may have mild cramping and a small amount of vaginal bleeding. This is normal.  You may have a small amount of vaginal bleeding for a few days. This is normal.  It is up to you to get the results of your procedure. Ask your health care provider, or the department that is doing the procedure, when your results will be ready. Follow these instructions at home:  Take over-the-counter and prescription medicines only as told by your health care provider.  Do not douche, use tampons, or have   sexual intercourse until your health care provider approves.  Return to your normal activities as told by your health care provider. Ask your health care provider what activities are safe for you.  Follow instructions from your health care provider about any activity restrictions, such as restrictions on strenuous exercise or heavy  lifting.  Keep all follow-up visits. This is important. Contact a health care provider:  You have heavy bleeding, or bleed for longer than 2 days after the procedure.  You have bad smelling discharge from your vagina.  You have a fever or chills.  You have a burning sensation when urinating or you have difficulty urinating.  You have severe pain in your lower abdomen. Get help right away if you:  You have severe cramps in your stomach or back.  You pass large blood clots.  Your bleeding increases.  You become weak or light-headed, or you faint or lose consciousness. Summary  An endometrial biopsy is a procedure to remove tissue samples is taken from the endometrium, which is the lining of the uterus.  The tissue sample that is removed will be checked under a microscope for disease.  This procedure is used to diagnose conditions such as endometrial cancer, endometrial tuberculosis, polyps, or other inflammatory conditions.  After the procedure, it is common to have mild cramping and a small amount of vaginal bleeding for a few days.  Do not douche, use tampons, or have sexual intercourse until your health care provider approves. Ask your health care provider which activities are safe for you. This information is not intended to replace advice given to you by your health care provider. Make sure you discuss any questions you have with your health care provider. Document Revised: 08/27/2019 Document Reviewed: 08/27/2019 Elsevier Patient Education  2021 Elsevier Inc. https://[email or web address].org/Patients/FAQs/Sonohysterography"> Diagnostic ultrasound (5th ed., pp. 528-563). [place]: Elsevier."> Pfenninger and Fowler's procedures for primary care (4th ed., pp. 925-931). [place]: Elsevier.">  Sonohysterogram  A sonohysterogram is a procedure to examine the inside of the uterus. This exam uses sound waves that are sent to a computer to make images of the lining of the uterus  (endometrium). To get the best images, a salt-water solution is put into the uterus through the vagina. You may have this procedure if you have certain reproductive problems, such as abnormal bleeding, infertility, or miscarriage. This procedure can show what may be causing these problems. Possible causes of these problems include scarring or abnormal growths, such as fibroids, inside your uterus. It can also show if your uterus is an abnormal shape or if the lining of the uterus is too thin. Tell a health care provider about:  All medicines you are taking, including vitamins, herbs, eye drops, creams, and over-the-counter medicines.  Any allergies you have.  Any blood disorders you have.  Any surgeries you have had.  Any medical conditions you have.  Whether you are pregnant or may be pregnant.  The date of the first day of your last period.  Any signs of infection, such as fever, pain in your lower abdomen, or abnormal discharge from your vagina. The procedure will not be done if you have an infection. What are the risks? Generally, this is a safe procedure. However, problems may occur, including:  Abdominal pain or cramping.  Light bleeding (spotting).  Increased vaginal discharge.  Infection. What happens before the procedure?  Your health care provider may have you take an over-the-counter pain medicine.  You may be given medicine to stop any abnormal bleeding.    You may be given antibiotic medicine to help prevent infection.  You may be asked to take a pregnancy test. This is usually in the form of a urine test. The procedure will not be done if you are pregnant.  You may have a pelvic exam.  You will be asked to empty your bladder. What happens during the procedure?  You will lie down on the exam table with your feet in stirrups or with your knees bent and your feet flat on the table.  A slender, handheld device (transducer) will be lubricated and placed into your  vagina.  The transducer will be positioned to send sound waves to your uterus. The sound waves will be sent to a computer and turned into images, which your health care provider will see during the procedure.  The transducer will be removed from your vagina.  An instrument called a speculum will be inserted to widen the opening of your vagina.  A swab with germ-killing solution (antiseptic) will be used to clean the opening to your uterus (cervix).  A long, thin tube (catheter) will be placed through your cervix into your uterus, and the speculum will be removed.  The transducer will be placed back into your vagina to take more images.  Your uterus will be filled with a germ-free salt-water solution (sterile saline) through the catheter. You may feel some cramping.  A fluid that contains air bubbles may be sent through the catheter to make it easier to see the fallopian tubes.  The transducer and catheter will be removed. The procedure may vary among health care providers and hospitals. What can I expect after the procedure? After the procedure, it is common to have:  Light bleeding from your vagina (spotting).  Pain or cramping in your abdomen.  Watery discharge from your vagina. Follow these instructions at home:  Take over-the-counter and prescription medicines only as told by your health care provider.  If you were prescribed an antibiotic medicine, take it as told by your health care provider. Do not stop taking the antibiotic even if you start to feel better.  Wear a sanitary pad or a tampon if you have spotting.  Return to your normal activities as told by your health care provider. Ask your health care provider what activities are safe for you.  It is up to you to get the results of your procedure. Ask your health care provider, or the department that is doing the procedure, when your results will be ready.  Keep all follow-up visits as told by your health care  provider. This is important. Contact a health care provider if you have:  Chills or a fever.  Pain or cramping that does not go away even with medicine.  An increase in vaginal discharge.  Vaginal discharge that is yellow or green in color and has a bad smell.  Nausea. Get help right away if you have:  Severe pain in your abdomen.  Heavy bleeding from your vagina. Summary  A sonohysterogram is a procedure that creates images of the inside of the uterus.  You may have this procedure if you have certain reproductive problems, such as abnormal bleeding, infertility, or miscarriage.  You may need to have a pelvic exam and take a pregnancy test before this procedure. The procedure will not be done if you are pregnant or have an infection.  After the procedure, it is common to have cramping and spotting. This information is not intended to replace advice given to   you by your health care provider. Make sure you discuss any questions you have with your health care provider. Document Revised: 01/16/2019 Document Reviewed: 01/16/2019 Elsevier Patient Education  2021 Elsevier Inc.  

## 2020-03-18 NOTE — Telephone Encounter (Signed)
Called patient no answer. LMOM. Per Chloe Cervantes patient is coming in tomorrow to see Chloe Cervantes at 10:30 am for cortisone inj's. Dr. Prince Cervantes is the one to do these injs. Need to cancel appt and R/s appt with Dr Chloe Cervantes.

## 2020-03-18 NOTE — Telephone Encounter (Signed)
Appt made

## 2020-03-19 ENCOUNTER — Ambulatory Visit: Payer: BC Managed Care – PPO | Admitting: Physician Assistant

## 2020-03-25 ENCOUNTER — Ambulatory Visit: Payer: BC Managed Care – PPO | Admitting: Family Medicine

## 2020-04-01 ENCOUNTER — Encounter: Payer: Self-pay | Admitting: Family Medicine

## 2020-04-01 ENCOUNTER — Ambulatory Visit: Payer: Self-pay

## 2020-04-01 ENCOUNTER — Telehealth: Payer: Self-pay

## 2020-04-01 ENCOUNTER — Ambulatory Visit (INDEPENDENT_AMBULATORY_CARE_PROVIDER_SITE_OTHER): Payer: BC Managed Care – PPO | Admitting: Family Medicine

## 2020-04-01 ENCOUNTER — Other Ambulatory Visit: Payer: Self-pay

## 2020-04-01 DIAGNOSIS — M25552 Pain in left hip: Secondary | ICD-10-CM | POA: Diagnosis not present

## 2020-04-01 DIAGNOSIS — M25551 Pain in right hip: Secondary | ICD-10-CM | POA: Diagnosis not present

## 2020-04-01 DIAGNOSIS — M25859 Other specified joint disorders, unspecified hip: Secondary | ICD-10-CM

## 2020-04-01 NOTE — Progress Notes (Signed)
Office Visit Note   Patient: Chloe Cervantes           Date of Birth: 20-Nov-1990           MRN: [id number] Visit Date: 04/01/2020 Requested by: Deeann Saint, MD [address] [place],  [place] [address] PCP: Deeann Saint, MD  Subjective: Chief Complaint  Patient presents with  . Right Hip - Pain, Follow-up    Planned bilateral intra-articular hip injections (Dr. Warren Danes patient)  . Left Hip - Pain, Follow-up    HPI: She is here for possible bilateral hip injections.  She has labrum tears.  She is scheduled to meet with Duwayne Heck in a couple weeks to discuss surgical options.  Has femoral acetabular impingement.              ROS:   All other systems were reviewed and are negative.  Objective: Vital Signs: There were no vitals taken for this visit.  Physical Exam:  General:  Alert and oriented, in no acute distress. Pulm:  Breathing unlabored. Psy:  Normal mood, congruent affect.    Imaging: No results found.  Assessment & Plan: 1.  Bilateral hip labrum tears with femoral acetabular impingement -We discussed injection.  She is very reluctant to have injections due to the pain she experienced during arthrogram.  We discussed the fact that injections will not likely make anything heal, but could give her some pain relief.  She states that her pain is tolerable right now.  She wants to wait until she consults with Dr. Aundria Rud.  If surgery is not recommended, then we will try a one-time cortisone injection or possibly dextrose prolotherapy.  I recommended glucosamine and turmeric, avoidance of sugar, and eventually regular exercise to preserve her hips.     Procedures: No procedures performed        PMFS History: Patient Active Problem List   Diagnosis Date Noted  . PCOS (polycystic ovarian syndrome) 04/07/2018  . Anxiety and depression 04/07/2018  . Iron deficiency anemia 04/07/2018  . Abnormal uterine bleeding (AUB) 01/21/2017   Past Medical  History:  Diagnosis Date  . Complication of anesthesia    hard to wake up from wisdom teeth removal  . Depression   . Endometrial polyp   . GERD (gastroesophageal reflux disease)   . History of gestational diabetes   . History of pregnancy induced hypertension   . IDA (iron deficiency anemia)   . PCOS (polycystic ovarian syndrome)   . Wears glasses     Family History  Problem Relation Age of Onset  . Diabetes Mother   . Hypertension Mother   . Hyperlipidemia Mother     Past Surgical History:  Procedure Laterality Date  . HYSTEROSCOPY WITH D & C N/A 10/17/2018   Procedure: DILATION AND CURRETAGE WITH HYSTEROSCOPY;  Surgeon: Dara Lords, MD;  Location: [facility];  Service: Gynecology;  Laterality: N/A;  . LAPAROSCOPY Left 10/17/2018   Procedure: LAPAROSCOPIC  REMOVAL OF PARATUBAL CYST;  Surgeon: Dara Lords, MD;  Location: [facility];  Service: Gynecology;  Laterality: Left;  . NO PAST SURGERIES    . WISDOM TOOTH EXTRACTION  [age]   Social History   Occupational History  . Not on file  Tobacco Use  . Smoking status: Current Some Day Smoker    Years: 2.00    Types: Cigarettes, Cigars  . Smokeless tobacco: Never Used  Vaping Use  . Vaping Use: Never used  Substance and Sexual Activity  . Alcohol use: Yes    Comment: Occasional  . Drug use: No  . Sexual activity: Yes    Birth control/protection: None    Comment: 1st intercourse 30 yo-More than 5 partners

## 2020-04-01 NOTE — Telephone Encounter (Signed)
Patient is calling in asking for a copy of the picture x-ray's for her orthopedic.

## 2020-04-02 NOTE — Telephone Encounter (Signed)
Spoke with pt advised that the orthopedic office should be able to view the x-ray from her chart.

## 2020-04-04 ENCOUNTER — Telehealth: Payer: BC Managed Care – PPO | Admitting: Family Medicine

## 2020-04-15 ENCOUNTER — Telehealth: Payer: Self-pay | Admitting: Family Medicine

## 2020-04-15 DIAGNOSIS — M25551 Pain in right hip: Secondary | ICD-10-CM | POA: Diagnosis not present

## 2020-04-15 DIAGNOSIS — M24159 Other articular cartilage disorders, unspecified hip: Secondary | ICD-10-CM | POA: Diagnosis not present

## 2020-04-15 DIAGNOSIS — R0683 Snoring: Secondary | ICD-10-CM

## 2020-04-15 DIAGNOSIS — M25552 Pain in left hip: Secondary | ICD-10-CM | POA: Diagnosis not present

## 2020-04-15 NOTE — Telephone Encounter (Signed)
Patient is calling and stated that she was supposed to be referred to get a sleep study but she hasn't yet, please advise. CB is [phone]

## 2020-04-15 NOTE — Telephone Encounter (Signed)
Spoke with pt, advised that her sleep study was already placed by her GYN office, per [facility] referral coordinator who checked on pt referral. Provided pt with the GYN office number, verbalized understanding

## 2020-04-22 ENCOUNTER — Telehealth: Payer: Self-pay | Admitting: *Deleted

## 2020-04-22 NOTE — Telephone Encounter (Signed)
Patient called and asking if Chloe Beady NP placed a Sleep Study referral at office visit on 03/18/20. I explained to patient no mention of referral nor was a referral placed for this at this visit or any visit that I can see. Patient said her PCP office told her a referral was placed here. Patient will follow back up with PCP.

## 2020-04-22 NOTE — Telephone Encounter (Signed)
Patient called back and stated that she contacted her GYN and they had no record or no notes on being referred to get a sleep study. Patient is asking if provider can put in a referral to get a sleep study, please advise. CB is [phone]

## 2020-04-23 DIAGNOSIS — M25552 Pain in left hip: Secondary | ICD-10-CM | POA: Diagnosis not present

## 2020-04-23 DIAGNOSIS — M25551 Pain in right hip: Secondary | ICD-10-CM | POA: Diagnosis not present

## 2020-05-02 NOTE — Telephone Encounter (Signed)
Order was placed but expired in December 2021.  Okay to place new order.

## 2020-05-05 NOTE — Addendum Note (Signed)
Addended by: Elwin Mocha on: 05/05/2020 03:13 PM   Modules accepted: Orders

## 2020-05-05 NOTE — Telephone Encounter (Signed)
Referral placed per Dr. Salomon Fick. Patient is aware.

## 2020-05-12 DIAGNOSIS — S99922A Unspecified injury of left foot, initial encounter: Secondary | ICD-10-CM | POA: Diagnosis not present

## 2020-05-12 DIAGNOSIS — S91115A Laceration without foreign body of left lesser toe(s) without damage to nail, initial encounter: Secondary | ICD-10-CM | POA: Diagnosis not present

## 2020-05-15 ENCOUNTER — Other Ambulatory Visit: Payer: Self-pay | Admitting: Nurse Practitioner

## 2020-05-15 ENCOUNTER — Telehealth: Payer: Self-pay

## 2020-05-15 DIAGNOSIS — N939 Abnormal uterine and vaginal bleeding, unspecified: Secondary | ICD-10-CM

## 2020-05-15 MED ORDER — MEGESTROL ACETATE 40 MG PO TABS: 40.0000 mg | ORAL_TABLET | Freq: Two times a day (BID) | ORAL | 0 refills | Status: DC

## 2020-05-15 NOTE — Telephone Encounter (Signed)
I will send in a refill. Please make sure she is only taking if she starts bleeding.

## 2020-05-15 NOTE — Telephone Encounter (Addendum)
Patient informed. Chloe Cervantes,  I forgot to tell you in previous note that she had started bleeding with clots and only had one pill left.  She said the entire time she took it she bled. She said it was not a lot and mostly brown but every day. Today she started with clots.  I let her know Elmarie Shiley may have left for the day and is off on Friday. Patient said She said she will be out of town tomorrow anyway and is fine with me calling her back Monday.

## 2020-05-15 NOTE — Telephone Encounter (Signed)
Patient called stating she is out of Megace. She said that you did not want her to be bleed before [facility] on 05/27/20 because she is anemic.  She needs refill.

## 2020-05-19 ENCOUNTER — Other Ambulatory Visit: Payer: Self-pay | Admitting: Nurse Practitioner

## 2020-05-19 MED ORDER — MEGESTROL ACETATE 40 MG PO TABS: 40.0000 mg | ORAL_TABLET | Freq: Two times a day (BID) | ORAL | 0 refills | Status: AC

## 2020-05-19 NOTE — Telephone Encounter (Signed)
I have approved new prescription. Thank you.

## 2020-05-19 NOTE — Telephone Encounter (Signed)
Patient informed. It is light bleeding now, when she wipes and a little on pad, although she does have a blood clot occasionally.  She will continue on the Megace until procedure. She will be out of it on Saturday and will need one more refill.

## 2020-05-19 NOTE — Addendum Note (Signed)
Addended byWyline Beady on: 05/19/2020 10:34 AM   Modules accepted: Orders

## 2020-05-19 NOTE — Addendum Note (Signed)
Addended by: Keenan Bachelor on: 05/19/2020 10:07 AM   Modules accepted: Orders

## 2020-05-19 NOTE — Telephone Encounter (Signed)
It is okay to continue until her appointment next week. Light bleeding will be ok for the procedure, we just want to avoid heavy bleeding. Thank you.

## 2020-05-21 DIAGNOSIS — M25551 Pain in right hip: Secondary | ICD-10-CM | POA: Diagnosis not present

## 2020-05-21 DIAGNOSIS — M24151 Other articular cartilage disorders, right hip: Secondary | ICD-10-CM | POA: Diagnosis not present

## 2020-05-21 DIAGNOSIS — M25552 Pain in left hip: Secondary | ICD-10-CM | POA: Diagnosis not present

## 2020-05-27 ENCOUNTER — Ambulatory Visit (INDEPENDENT_AMBULATORY_CARE_PROVIDER_SITE_OTHER): Payer: BC Managed Care – PPO

## 2020-05-27 ENCOUNTER — Other Ambulatory Visit (HOSPITAL_COMMUNITY)
Admission: RE | Admit: 2020-05-27 | Discharge: 2020-05-27 | Disposition: A | Discharge status: Home / Self Care | Payer: BC Managed Care – PPO | Source: Ambulatory Visit | Attending: Obstetrics and Gynecology | Admitting: Obstetrics and Gynecology

## 2020-05-27 ENCOUNTER — Other Ambulatory Visit: Payer: Self-pay

## 2020-05-27 ENCOUNTER — Ambulatory Visit: Payer: BC Managed Care – PPO | Admitting: Obstetrics and Gynecology

## 2020-05-27 ENCOUNTER — Ambulatory Visit (INDEPENDENT_AMBULATORY_CARE_PROVIDER_SITE_OTHER): Payer: BC Managed Care – PPO | Admitting: Obstetrics and Gynecology

## 2020-05-27 ENCOUNTER — Encounter: Payer: Self-pay | Admitting: Obstetrics and Gynecology

## 2020-05-27 VITALS — BP 110/68 | HR 101 | Ht 61.5 in | Wt 215.0 lb

## 2020-05-27 DIAGNOSIS — N939 Abnormal uterine and vaginal bleeding, unspecified: Secondary | ICD-10-CM | POA: Insufficient documentation

## 2020-05-27 DIAGNOSIS — E282 Polycystic ovarian syndrome: Secondary | ICD-10-CM | POA: Diagnosis not present

## 2020-05-27 DIAGNOSIS — R9389 Abnormal findings on diagnostic imaging of other specified body structures: Secondary | ICD-10-CM | POA: Insufficient documentation

## 2020-05-27 DIAGNOSIS — N912 Amenorrhea, unspecified: Secondary | ICD-10-CM | POA: Diagnosis not present

## 2020-05-27 DIAGNOSIS — Z01812 Encounter for preprocedural laboratory examination: Secondary | ICD-10-CM

## 2020-05-27 DIAGNOSIS — R7303 Prediabetes: Secondary | ICD-10-CM

## 2020-05-27 LAB — PREGNANCY, URINE: Preg Test, Ur: NEGATIVE

## 2020-05-27 NOTE — Progress Notes (Signed)
GYNECOLOGY  VISIT   HPI: 30 y.o.   Married Other or two or more races Hispanic or Latino  female   [phone] with No LMP recorded. (Menstrual status: Irregular Periods).   here for evaluation of thickened endometrium. At the time of her last ultrasound in 2/22 her endometrium was 2 cm, somewhat vascular with no definite mass seen.   The patient has a h/o PCOS with irregular cycles. Wyline Beady, NP started her on metformin in 2/22.  The patient was started on Megace in 1/22 after a several week episode of heavy bleeding with clots. She has been taking the megace since then, stopped it for a few weeks and states no W/D bleed.  She reports that she didn't have a period for an entire year prior to December, 2021.   She wants to get pregnant. She has 2 daughters (5 & 7), spontaneous conceptions.   GYNECOLOGIC HISTORY: No LMP recorded. (Menstrual status: Irregular Periods). Contraception: none  Menopausal hormone therapy: none         OB History    Gravida  3   Para  2   Term  2   Preterm      AB  1   Living  2     SAB  1   IAB      Ectopic      Multiple      Live Births                 Patient Active Problem List   Diagnosis Date Noted  . Prediabetes   . PCOS (polycystic ovarian syndrome) 04/07/2018  . Anxiety and depression 04/07/2018  . Iron deficiency anemia 04/07/2018  . Abnormal uterine bleeding (AUB) 01/21/2017    Past Medical History:  Diagnosis Date  . Complication of anesthesia    hard to wake up from wisdom teeth removal  . Depression   . Endometrial polyp   . GERD (gastroesophageal reflux disease)   . History of gestational diabetes   . History of pregnancy induced hypertension   . IDA (iron deficiency anemia)   . PCOS (polycystic ovarian syndrome)   . Prediabetes   . Wears glasses     Past Surgical History:  Procedure Laterality Date  . HYSTEROSCOPY WITH D & C N/A 10/17/2018   Procedure: DILATION AND CURRETAGE WITH HYSTEROSCOPY;   Surgeon: Dara Lords, MD;  Location: [facility];  Service: Gynecology;  Laterality: N/A;  . LAPAROSCOPY Left 10/17/2018   Procedure: LAPAROSCOPIC  REMOVAL OF PARATUBAL CYST;  Surgeon: Dara Lords, MD;  Location: [facility];  Service: Gynecology;  Laterality: Left;  . NO PAST SURGERIES    . WISDOM TOOTH EXTRACTION  teen    Current Outpatient Medications  Medication Sig Dispense Refill  . megestrol (MEGACE) 40 MG tablet Take 1 tablet (40 mg total) by mouth 2 (two) times daily for 10 days. 20 tablet 0  . metFORMIN (GLUCOPHAGE) 500 MG tablet Take 1 tablet (500 mg total) by mouth 2 (two) times daily with a meal. 180 tablet 2  . metroNIDAZOLE (METROGEL) 0.75 % gel Apply 1 application topically 2 (two) times daily. Apply on face. 45 g 0   No current facility-administered medications for this visit.     ALLERGIES: Patient has no known allergies.  Family History  Problem Relation Age of Onset  . Diabetes Mother   . Hypertension Mother   . Hyperlipidemia Mother     Social History  Socioeconomic History  . Marital status: Married    Spouse name: Not on file  . Number of children: Not on file  . Years of education: Not on file  . Highest education level: Not on file  Occupational History  . Not on file  Tobacco Use  . Smoking status: Current Some Day Smoker    Years: 2.00    Types: Cigarettes, Cigars  . Smokeless tobacco: Never Used  Vaping Use  . Vaping Use: Never used  Substance and Sexual Activity  . Alcohol use: Yes    Comment: Occasional  . Drug use: No  . Sexual activity: Yes    Birth control/protection: None    Comment: 1st intercourse 30 yo-More than 5 partners  Other Topics Concern  . Not on file  Social History Narrative  . Not on file   Social Determinants of Health   Financial Resource Strain: Not on file  Food Insecurity: Not on file  Transportation Needs: Not on file  Physical Activity: Not on file  Stress:  Not on file  Social Connections: Not on file  Intimate Partner Violence: Not on file    ROS  PHYSICAL EXAMINATION:    BP 110/68   Pulse (!) 101   Ht 5' 1.5" (1.562 m)   Wt 215 lb (97.5 kg)   SpO2 99%   BMI 39.97 kg/m     General appearance: alert, cooperative and appears stated age Neck: supple, no thyromegaly Heart: regular rate and rhythm Lungs: CTAB Abdomen: soft, non-tender; bowel sounds normal; no masses,  no organomegaly Extremities: normal, atraumatic, no cyanosis Skin: normal color, texture and turgor, no rashes or lesions Lymph: normal cervical supraclavicular and inguinal nodes Neurologic: grossly normal    Pelvic: External genitalia:  no lesions              Urethra:  normal appearing urethra with no masses, tenderness or lesions              Bartholins and Skenes: normal                 Vagina: normal appearing vagina with normal color and discharge, no lesions              Cervix: no lesions  Pelvic ultrasound  Indications: AUB, thickened endometrium  Findings:  Uterus retroverted, normal sized, not remeasured (see ultrasound report from 03/18/20)  Endometrium 15.2 mm  Left ovary 3.31 x 1.96 x 2.06 cm  Right ovary 3.58 x 3.07 x 2.35 cm  No free fluid  Sonohysterogram and endometrial biopsy The procedure and risks of the procedure were reviewed with the patient, consent form was signed. A speculum was placed in the vagina and the cervix was cleansed with Hibiclens. The sonohysterogram catheter was inserted into the uterine cavity without difficulty. Saline was infused under direct observation with the ultrasound. Her uterine lining was very irregular, suspected polyps, possible flow in one are. The uterine evacuator was used to biopsy the lining of the uterus. This was done with ultrasound guidance. Only a small portion of the abnormal tissue got in the catheter. The catheter was removed.    Impression:  Normal sized retroverted uterus Endometrium  thickened Normal ovaries bilaterally Sonohysterogram: thickened very irregular appearing endometrium    Chaperone was present for exam.  1. Abnormal uterine bleeding AUB after a year of amenorrhea Sonohysterogram with very irregularly thickened endometrium  2. Amenorrhea H/O PCOS Discussed the importance of regular cycles or endometrial protection.  3. Pre-procedure  lab exam - Pregnancy, urine  4. Thickened endometrium Very thickened, irregular endometrium on sonohysterogram Endometrial biopsy done. As long as results are benign with proceed with hysteroscopy. Plan: hysteroscopy, dilation and curettage. Reviewed risks, including: bleeding, infection, uterine perforation, fluid overload, need for further sugery ACOG handouts given   5. PCOS (polycystic ovarian syndrome)   6. Prediabetes  In addition to the sonohysterogram and endometrial biopsy, over 15 minutes was spent in total patient care. Most of this was in counseling about management and importance of endometrial protection  CC: Wyline Beady, NP

## 2020-05-28 ENCOUNTER — Telehealth: Payer: Self-pay | Admitting: *Deleted

## 2020-05-28 NOTE — Telephone Encounter (Signed)
Left message to call Chloe Mikulski, RN at [facility], [phone].  Called to review surgery dates. 

## 2020-05-29 LAB — SURGICAL PATHOLOGY

## 2020-05-29 NOTE — Telephone Encounter (Signed)
Left message to call Kip Cropp, RN at [facility], [phone].  

## 2020-06-03 DIAGNOSIS — M25551 Pain in right hip: Secondary | ICD-10-CM | POA: Diagnosis not present

## 2020-06-03 DIAGNOSIS — M25552 Pain in left hip: Secondary | ICD-10-CM | POA: Diagnosis not present

## 2020-06-03 NOTE — Telephone Encounter (Signed)
Spoke with patient. Reviewed available surgery dates and Covid testing and quarantine requirements.  Patient wants to review dates with employer and return call.

## 2020-06-03 NOTE — Telephone Encounter (Signed)
Spoke with patient. Patient request to proceed with surgery on 08/12/20. Declines earlier surgery dates offered.  Advised patient once surgery date and time confirmed, I will return call to confirm. Patient verbalizes understanding and is agreeable.   Surgery request sent.   Routing to [employer]

## 2020-06-09 NOTE — Telephone Encounter (Signed)
Call to patient. Per DPR, OK to leave message on voicemail.   Left voicemail requesting a return call to [employer] to review benefits for scheduled surgery with Jill Jertson, MD. 

## 2020-06-10 DIAGNOSIS — M25551 Pain in right hip: Secondary | ICD-10-CM | POA: Diagnosis not present

## 2020-06-10 DIAGNOSIS — M25552 Pain in left hip: Secondary | ICD-10-CM | POA: Diagnosis not present

## 2020-06-11 ENCOUNTER — Institutional Professional Consult (permissible substitution): Payer: BC Managed Care – PPO | Admitting: Neurology

## 2020-06-11 NOTE — Telephone Encounter (Signed)
Spoke with patient regarding surgery benefits. Patient acknowledges understanding of information presented. Patient is aware that benefits presented are professional benefits only. Patient is aware that once surgery is scheduled, the hospital will call with separate benefits. See account note.  Encounter closed. 

## 2020-06-11 NOTE — Telephone Encounter (Signed)
Spoke with patient. Surgery date request confirmed.  Advised surgery is scheduled for 08/12/20, [facility] 0730.  Surgery instruction sheet and hospital brochure reviewed, printed copy will be mailed. Patient advised of Covid screening and quarantine requirements and agreeable.    Routing to [employer] to review benefits.

## 2020-07-02 DIAGNOSIS — M25552 Pain in left hip: Secondary | ICD-10-CM | POA: Diagnosis not present

## 2020-07-02 DIAGNOSIS — M24151 Other articular cartilage disorders, right hip: Secondary | ICD-10-CM | POA: Diagnosis not present

## 2020-07-02 DIAGNOSIS — M25551 Pain in right hip: Secondary | ICD-10-CM | POA: Diagnosis not present

## 2020-07-07 ENCOUNTER — Institutional Professional Consult (permissible substitution): Payer: BC Managed Care – PPO | Admitting: Neurology

## 2020-07-08 ENCOUNTER — Encounter: Payer: Self-pay | Admitting: Neurology

## 2020-07-09 ENCOUNTER — Ambulatory Visit: Payer: BC Managed Care – PPO | Attending: Internal Medicine

## 2020-07-09 ENCOUNTER — Other Ambulatory Visit: Payer: Self-pay

## 2020-07-09 DIAGNOSIS — Z23 Encounter for immunization: Secondary | ICD-10-CM

## 2020-07-09 NOTE — Progress Notes (Signed)
   Covid-19 Vaccination Clinic  Name:  Chloe Cervantes    MRN: [id number] DOB: [birth year]  07/09/2020  [name] was observed post Covid-19 immunization for 15 minutes without incident. She was provided with Vaccine Information Sheet and instruction to access the V-Safe system.   [name] was instructed to call 911 with any severe reactions post vaccine: Marland Kitchen Difficulty breathing  . Swelling of face and throat  . A fast heartbeat  . A bad rash all over body  . Dizziness and weakness   Immunizations Administered    Name Date Dose VIS Date Route   PFIZER Comrnaty(Gray TOP) Covid-19 Vaccine 07/09/2020 10:55 AM 0.3 mL 01/24/2020 Intramuscular   Manufacturer: [employer], [employer]   Lot: [id number]   NDC: [phone]

## 2020-07-22 ENCOUNTER — Other Ambulatory Visit: Payer: Self-pay

## 2020-07-22 ENCOUNTER — Encounter: Payer: Self-pay | Admitting: Obstetrics and Gynecology

## 2020-07-22 ENCOUNTER — Ambulatory Visit (INDEPENDENT_AMBULATORY_CARE_PROVIDER_SITE_OTHER): Payer: BC Managed Care – PPO | Admitting: Obstetrics and Gynecology

## 2020-07-22 VITALS — BP 130/80 | HR 72 | Resp 14 | Ht 61.5 in | Wt 218.2 lb

## 2020-07-22 DIAGNOSIS — N939 Abnormal uterine and vaginal bleeding, unspecified: Secondary | ICD-10-CM

## 2020-07-22 DIAGNOSIS — R7303 Prediabetes: Secondary | ICD-10-CM

## 2020-07-22 DIAGNOSIS — E282 Polycystic ovarian syndrome: Secondary | ICD-10-CM

## 2020-07-22 NOTE — H&P (View-Only) (Signed)
GYNECOLOGY  VISIT   HPI: 30 y.o.   Married  other  female   [phone] with Patient's last menstrual period was 06/27/2020.   here for   Pre op exam The patient has a h/o PCOS who presented with abnormal uterine bleeding after a year of amenorrhea. Ultrasound showed a thickened endometrium. Sonohysterogram revealed thickened, very irregular appearing endometrium. Endometrial biopsy with polypoid endometrium.   She desires pregnancy. She had a spontaneous menses last month. Since then she had a condom come off inside of her. She is having slight cramps, tender breasts. She checked a home pregnancy test last week, wants the blood work today.   H/O prediabetes. Non smoker.   GYNECOLOGIC HISTORY: Patient's last menstrual period was 06/27/2020. Contraception:  none Menopausal hormone therapy:  none Last mammogram:  n/a Last pap smear:   03-03-17 negative         OB History    Gravida  3   Para  2   Term  2   Preterm      AB  1   Living  2     SAB  1   IAB      Ectopic      Multiple      Live Births                 Patient Active Problem List   Diagnosis Date Noted  . Prediabetes   . Femoral acetabular impingement 02/29/2020  . PCOS (polycystic ovarian syndrome) 04/07/2018  . Anxiety and depression 04/07/2018  . Iron deficiency anemia 04/07/2018  . Abnormal uterine bleeding (AUB) 01/21/2017    Past Medical History:  Diagnosis Date  . Complication of anesthesia    hard to wake up from wisdom teeth removal  . Depression   . Endometrial polyp   . GERD (gastroesophageal reflux disease)   . History of gestational diabetes   . History of pregnancy induced hypertension   . IDA (iron deficiency anemia)   . PCOS (polycystic ovarian syndrome)   . Prediabetes   . Wears glasses     Past Surgical History:  Procedure Laterality Date  . HYSTEROSCOPY WITH D & C N/A 10/17/2018   Procedure: DILATION AND CURRETAGE WITH HYSTEROSCOPY;  Surgeon: Dara Lords, MD;   Location: [facility];  Service: Gynecology;  Laterality: N/A;  . LAPAROSCOPY Left 10/17/2018   Procedure: LAPAROSCOPIC  REMOVAL OF PARATUBAL CYST;  Surgeon: Dara Lords, MD;  Location: [facility];  Service: Gynecology;  Laterality: Left;  . NO PAST SURGERIES    . WISDOM TOOTH EXTRACTION  [age]    No current outpatient medications on file.   No current facility-administered medications for this visit.     ALLERGIES: Patient has no known allergies.  Family History  Problem Relation Age of Onset  . Diabetes Mother   . Hypertension Mother   . Hyperlipidemia Mother     Social History   Socioeconomic History  . Marital status: Married    Spouse name: Not on file  . Number of children: Not on file  . Years of education: Not on file  . Highest education level: Not on file  Occupational History  . Not on file  Tobacco Use  . Smoking status: Current Some Day Smoker    Years: 2.00    Types: Cigarettes, Cigars  . Smokeless tobacco: Never Used  Vaping Use  . Vaping Use: Never used  Substance and Sexual Activity  . Alcohol  use: Yes    Comment: Occasional  . Drug use: No  . Sexual activity: Yes    Birth control/protection: None    Comment: 1st intercourse 30 yo-More than 5 partners  Other Topics Concern  . Not on file  Social History Narrative  . Not on file   Social Determinants of Health   Financial Resource Strain: Not on file  Food Insecurity: Not on file  Transportation Needs: Not on file  Physical Activity: Not on file  Stress: Not on file  Social Connections: Not on file  Intimate Partner Violence: Not on file    Review of Systems  All other systems reviewed and are negative.   PHYSICAL EXAMINATION:    BP 130/80 (BP Location: Right Arm, Patient Position: Sitting, Cuff Size: Normal)   Pulse 72   Resp 14   Ht 5' 1.5" (1.562 m)   Wt 218 lb 4 oz (99 kg)   LMP 06/27/2020   BMI 40.57 kg/m     General appearance: alert,  cooperative and appears stated age Head: Normocephalic, without obvious abnormality, atraumatic Neck: no adenopathy, supple, symmetrical, trachea midline and thyroid normal to inspection and palpation Lungs: clear to auscultation bilaterally Heart: regular rate and rhythm Abdomen: soft, non-tender, no masses,  no organomegaly Extremities: extremities normal, atraumatic, no cyanosis or edema Skin: Skin color, texture, turgor normal. No rashes or lesions Lymph nodes: Cervical, supraclavicular, and axillary nodes normal. No abnormal inguinal nodes palpated Neurologic: Grossly normal  1. Abnormal uterine bleeding Patient had a condom come out during sex - Beta hCG quant (ref lab); Future -Plan: hysteroscopy, dilation and curettage. Reviewed risks, including: bleeding, infection, uterine perforation, fluid overload, need for further sugery   2. PCOS (polycystic ovarian syndrome)   3. Prediabetes - Hemoglobin A1c; Future    

## 2020-07-22 NOTE — Progress Notes (Signed)
GYNECOLOGY  VISIT   HPI: 30 y.o.   Married  other  female   [phone] with Patient's last menstrual period was 06/27/2020.   here for   Pre op exam The patient has a h/o PCOS who presented with abnormal uterine bleeding after a year of amenorrhea. Ultrasound showed a thickened endometrium. Sonohysterogram revealed thickened, very irregular appearing endometrium. Endometrial biopsy with polypoid endometrium.   She desires pregnancy. She had a spontaneous menses last month. Since then she had a condom come off inside of her. She is having slight cramps, tender breasts. She checked a home pregnancy test last week, wants the blood work today.   H/O prediabetes. Non smoker.   GYNECOLOGIC HISTORY: Patient's last menstrual period was 06/27/2020. Contraception:  none Menopausal hormone therapy:  none Last mammogram:  n/a Last pap smear:   03-03-17 negative         OB History    Gravida  3   Para  2   Term  2   Preterm      AB  1   Living  2     SAB  1   IAB      Ectopic      Multiple      Live Births                 Patient Active Problem List   Diagnosis Date Noted  . Prediabetes   . Femoral acetabular impingement 02/29/2020  . PCOS (polycystic ovarian syndrome) 04/07/2018  . Anxiety and depression 04/07/2018  . Iron deficiency anemia 04/07/2018  . Abnormal uterine bleeding (AUB) 01/21/2017    Past Medical History:  Diagnosis Date  . Complication of anesthesia    hard to wake up from wisdom teeth removal  . Depression   . Endometrial polyp   . GERD (gastroesophageal reflux disease)   . History of gestational diabetes   . History of pregnancy induced hypertension   . IDA (iron deficiency anemia)   . PCOS (polycystic ovarian syndrome)   . Prediabetes   . Wears glasses     Past Surgical History:  Procedure Laterality Date  . HYSTEROSCOPY WITH D & C N/A 10/17/2018   Procedure: DILATION AND CURRETAGE WITH HYSTEROSCOPY;  Surgeon: Dara Lords, MD;   Location: [facility];  Service: Gynecology;  Laterality: N/A;  . LAPAROSCOPY Left 10/17/2018   Procedure: LAPAROSCOPIC  REMOVAL OF PARATUBAL CYST;  Surgeon: Dara Lords, MD;  Location: [facility];  Service: Gynecology;  Laterality: Left;  . NO PAST SURGERIES    . WISDOM TOOTH EXTRACTION  [age]    No current outpatient medications on file.   No current facility-administered medications for this visit.     ALLERGIES: Patient has no known allergies.  Family History  Problem Relation Age of Onset  . Diabetes Mother   . Hypertension Mother   . Hyperlipidemia Mother     Social History   Socioeconomic History  . Marital status: Married    Spouse name: Not on file  . Number of children: Not on file  . Years of education: Not on file  . Highest education level: Not on file  Occupational History  . Not on file  Tobacco Use  . Smoking status: Current Some Day Smoker    Years: 2.00    Types: Cigarettes, Cigars  . Smokeless tobacco: Never Used  Vaping Use  . Vaping Use: Never used  Substance and Sexual Activity  . Alcohol  use: Yes    Comment: Occasional  . Drug use: No  . Sexual activity: Yes    Birth control/protection: None    Comment: 1st intercourse 30 yo-More than 5 partners  Other Topics Concern  . Not on file  Social History Narrative  . Not on file   Social Determinants of Health   Financial Resource Strain: Not on file  Food Insecurity: Not on file  Transportation Needs: Not on file  Physical Activity: Not on file  Stress: Not on file  Social Connections: Not on file  Intimate Partner Violence: Not on file    Review of Systems  All other systems reviewed and are negative.   PHYSICAL EXAMINATION:    BP 130/80 (BP Location: Right Arm, Patient Position: Sitting, Cuff Size: Normal)   Pulse 72   Resp 14   Ht 5' 1.5" (1.562 m)   Wt 218 lb 4 oz (99 kg)   LMP 06/27/2020   BMI 40.57 kg/m     General appearance: alert,  cooperative and appears stated age Head: Normocephalic, without obvious abnormality, atraumatic Neck: no adenopathy, supple, symmetrical, trachea midline and thyroid normal to inspection and palpation Lungs: clear to auscultation bilaterally Heart: regular rate and rhythm Abdomen: soft, non-tender, no masses,  no organomegaly Extremities: extremities normal, atraumatic, no cyanosis or edema Skin: Skin color, texture, turgor normal. No rashes or lesions Lymph nodes: Cervical, supraclavicular, and axillary nodes normal. No abnormal inguinal nodes palpated Neurologic: Grossly normal  1. Abnormal uterine bleeding Patient had a condom come out during sex - Beta hCG quant (ref lab); Future -Plan: hysteroscopy, dilation and curettage. Reviewed risks, including: bleeding, infection, uterine perforation, fluid overload, need for further sugery   2. PCOS (polycystic ovarian syndrome)   3. Prediabetes - Hemoglobin A1c; Future

## 2020-07-23 ENCOUNTER — Other Ambulatory Visit: Payer: Self-pay

## 2020-07-23 ENCOUNTER — Other Ambulatory Visit: Payer: BC Managed Care – PPO

## 2020-07-23 DIAGNOSIS — R7303 Prediabetes: Secondary | ICD-10-CM | POA: Diagnosis not present

## 2020-07-23 DIAGNOSIS — N939 Abnormal uterine and vaginal bleeding, unspecified: Secondary | ICD-10-CM

## 2020-07-24 LAB — HEMOGLOBIN A1C
Hgb A1c MFr Bld: 6.1 % of total Hgb — ABNORMAL HIGH (ref <5.7)
Mean Plasma Glucose: 128 mg/dL
eAG (mmol/L): 7.1 mmol/L

## 2020-07-24 LAB — HCG, QUANTITATIVE, PREGNANCY: HCG, Total, QN: <3 m[IU]/mL

## 2020-08-01 ENCOUNTER — Telehealth: Payer: Self-pay | Admitting: Family Medicine

## 2020-08-01 DIAGNOSIS — L719 Rosacea, unspecified: Secondary | ICD-10-CM

## 2020-08-01 NOTE — Telephone Encounter (Signed)
Pt call and stated she need a refill on metroNIDAZOLE (METROGEL) 0.75 % gel [[id number]]  sent to  [id number] - [place],  - [address] Phone:  [phone]  Fax:  [phone]

## 2020-08-07 ENCOUNTER — Encounter (HOSPITAL_BASED_OUTPATIENT_CLINIC_OR_DEPARTMENT_OTHER): Payer: Self-pay | Admitting: Obstetrics and Gynecology

## 2020-08-07 ENCOUNTER — Other Ambulatory Visit: Payer: Self-pay

## 2020-08-07 MED ORDER — METRONIDAZOLE 0.75 % EX GEL: 1.0000 "application " | Freq: Two times a day (BID) | CUTANEOUS | 0 refills | Status: DC

## 2020-08-07 NOTE — Telephone Encounter (Signed)
Refill sent to requested pharmacy.

## 2020-08-07 NOTE — Progress Notes (Signed)
Spoke w/ via phone for pre-op interview--- Pt Lab needs dos----   Urine preg            Lab results------ pt getting CBC, BMP, EKG done 08-11-2020 @ 1130 COVID test -----patient states asymptomatic no test needed Arrive at ------- 0530 on 08-12-2020 NPO after MN NO Solid Food.  Clear liquids from MN until--- 0430 Med rec completed Medications to take morning of surgery ----- NONE Diabetic medication ----- n/a Patient instructed no nail polish to be worn day of surgery Patient instructed to bring photo id and insurance card day of surgery Patient aware to have Driver (ride ) / caregiver for 24 hours after surgery -- husband, Jill Alexanders Patient Special Instructions ----- n/a Pre-Op special Istructions ----- n/a Patient verbalized understanding of instructions that were given at this phone interview. Patient denies shortness of breath, chest pain, fever, cough at this phone interview.

## 2020-08-07 NOTE — Addendum Note (Signed)
Addended by: Elwin Mocha on: 08/07/2020 10:11 AM   Modules accepted: Orders

## 2020-08-08 ENCOUNTER — Other Ambulatory Visit (HOSPITAL_COMMUNITY): Payer: BC Managed Care – PPO

## 2020-08-11 ENCOUNTER — Encounter (HOSPITAL_COMMUNITY)
Admission: RE | Admit: 2020-08-11 | Discharge: 2020-08-11 | Disposition: A | Discharge status: Home / Self Care | Payer: BC Managed Care – PPO | Source: Ambulatory Visit | Attending: Obstetrics and Gynecology | Admitting: Obstetrics and Gynecology

## 2020-08-11 ENCOUNTER — Other Ambulatory Visit: Payer: Self-pay

## 2020-08-11 DIAGNOSIS — Z8759 Personal history of other complications of pregnancy, childbirth and the puerperium: Secondary | ICD-10-CM | POA: Diagnosis not present

## 2020-08-11 DIAGNOSIS — F17210 Nicotine dependence, cigarettes, uncomplicated: Secondary | ICD-10-CM | POA: Diagnosis not present

## 2020-08-11 DIAGNOSIS — N939 Abnormal uterine and vaginal bleeding, unspecified: Secondary | ICD-10-CM | POA: Diagnosis not present

## 2020-08-11 DIAGNOSIS — E282 Polycystic ovarian syndrome: Secondary | ICD-10-CM | POA: Diagnosis not present

## 2020-08-11 DIAGNOSIS — N840 Polyp of corpus uteri: Secondary | ICD-10-CM | POA: Diagnosis not present

## 2020-08-11 DIAGNOSIS — Z01818 Encounter for other preprocedural examination: Secondary | ICD-10-CM | POA: Insufficient documentation

## 2020-08-11 DIAGNOSIS — R7303 Prediabetes: Secondary | ICD-10-CM | POA: Diagnosis not present

## 2020-08-11 LAB — BASIC METABOLIC PANEL
Anion gap: 8 (ref 5–15)
BUN: 10 mg/dL (ref 6–20)
CO2: 27 mmol/L (ref 22–32)
Calcium: 8.9 mg/dL (ref 8.9–10.3)
Chloride: 103 mmol/L (ref 98–111)
Creatinine, Ser: 0.63 mg/dL (ref 0.44–1.00)
GFR, Estimated: >60 mL/min (ref >60)
Glucose, Bld: 115 mg/dL — ABNORMAL HIGH (ref 70–99)
Potassium: 3.9 mmol/L (ref 3.5–5.1)
Sodium: 138 mmol/L (ref 135–145)

## 2020-08-11 LAB — CBC
HCT: 35.4 % — ABNORMAL LOW (ref 36.0–46.0)
Hemoglobin: 10.8 g/dL — ABNORMAL LOW (ref 12.0–15.0)
MCH: 24.3 pg — ABNORMAL LOW (ref 26.0–34.0)
MCHC: 30.5 g/dL (ref 30.0–36.0)
MCV: 79.6 fL — ABNORMAL LOW (ref 80.0–100.0)
Platelets: 468 10*3/uL — ABNORMAL HIGH (ref 150–400)
RBC: 4.45 MIL/uL (ref 3.87–5.11)
RDW: 17.1 % — ABNORMAL HIGH (ref 11.5–15.5)
WBC: 8.2 10*3/uL (ref 4.0–10.5)
nRBC: 0 % (ref 0.0–0.2)

## 2020-08-11 NOTE — Anesthesia Preprocedure Evaluation (Addendum)
Anesthesia Evaluation  Patient identified by MRN, date of birth, ID band Patient awake    Reviewed: Allergy & Precautions, NPO status , Patient's Chart, lab work & pertinent test results  History of Anesthesia Complications Negative for: history of anesthetic complications  Airway Mallampati: II  TM Distance: >3 FB Neck ROM: Full    Dental no notable dental hx. (+) Dental Advisory Given   Pulmonary neg pulmonary ROS, Current Smoker,    Pulmonary exam normal        Cardiovascular negative cardio ROS Normal cardiovascular exam     Neuro/Psych PSYCHIATRIC DISORDERS Anxiety Depression negative neurological ROS     GI/Hepatic Neg liver ROS, GERD  Medicated,  Endo/Other  Morbid obesityPCOS  Renal/GU negative Renal ROS  negative genitourinary   Musculoskeletal negative musculoskeletal ROS (+)   Abdominal   Peds  Hematology negative hematology ROS (+) anemia ,   Anesthesia Other Findings   Reproductive/Obstetrics                            Anesthesia Physical  Anesthesia Plan  ASA: 3  Anesthesia Plan: General   Post-op Pain Management:    Induction: Intravenous  PONV Risk Score and Plan: 4 or greater and Midazolam, Dexamethasone, Ondansetron and Treatment may vary due to age or medical condition  Airway Management Planned: LMA  Additional Equipment:   Intra-op Plan:   Post-operative Plan: Extubation in OR  Informed Consent: I have reviewed the patients History and Physical, chart, labs and discussed the procedure including the risks, benefits and alternatives for the proposed anesthesia with the patient or authorized representative who has indicated his/her understanding and acceptance.     Dental advisory given  Plan Discussed with: CRNA and Anesthesiologist  Anesthesia Plan Comments:        Anesthesia Quick Evaluation

## 2020-08-12 ENCOUNTER — Encounter (HOSPITAL_BASED_OUTPATIENT_CLINIC_OR_DEPARTMENT_OTHER): Payer: Self-pay | Admitting: Obstetrics and Gynecology

## 2020-08-12 ENCOUNTER — Encounter (HOSPITAL_BASED_OUTPATIENT_CLINIC_OR_DEPARTMENT_OTHER)
Admission: RE | Disposition: A | Discharge status: Home / Self Care | Payer: Self-pay | Source: Home / Self Care | Attending: Obstetrics and Gynecology

## 2020-08-12 ENCOUNTER — Ambulatory Visit (HOSPITAL_BASED_OUTPATIENT_CLINIC_OR_DEPARTMENT_OTHER)
Admission: RE | Admit: 2020-08-12 | Discharge: 2020-08-12 | Disposition: A | Discharge status: Home / Self Care | Payer: BC Managed Care – PPO | Attending: Obstetrics and Gynecology | Admitting: Obstetrics and Gynecology

## 2020-08-12 ENCOUNTER — Ambulatory Visit (HOSPITAL_BASED_OUTPATIENT_CLINIC_OR_DEPARTMENT_OTHER): Payer: BC Managed Care – PPO | Admitting: Anesthesiology

## 2020-08-12 DIAGNOSIS — R9389 Abnormal findings on diagnostic imaging of other specified body structures: Secondary | ICD-10-CM | POA: Diagnosis not present

## 2020-08-12 DIAGNOSIS — N939 Abnormal uterine and vaginal bleeding, unspecified: Secondary | ICD-10-CM | POA: Diagnosis not present

## 2020-08-12 DIAGNOSIS — R7303 Prediabetes: Secondary | ICD-10-CM | POA: Diagnosis not present

## 2020-08-12 DIAGNOSIS — N840 Polyp of corpus uteri: Secondary | ICD-10-CM | POA: Diagnosis not present

## 2020-08-12 DIAGNOSIS — D509 Iron deficiency anemia, unspecified: Secondary | ICD-10-CM | POA: Diagnosis not present

## 2020-08-12 DIAGNOSIS — F17210 Nicotine dependence, cigarettes, uncomplicated: Secondary | ICD-10-CM | POA: Diagnosis not present

## 2020-08-12 DIAGNOSIS — E282 Polycystic ovarian syndrome: Secondary | ICD-10-CM | POA: Insufficient documentation

## 2020-08-12 DIAGNOSIS — Z8759 Personal history of other complications of pregnancy, childbirth and the puerperium: Secondary | ICD-10-CM | POA: Diagnosis not present

## 2020-08-12 HISTORY — DX: Unspecified osteoarthritis, unspecified site: M19.90

## 2020-08-12 HISTORY — DX: Abnormal uterine and vaginal bleeding, unspecified: N93.9

## 2020-08-12 HISTORY — PX: HYSTEROSCOPY WITH D & C: SHX1775

## 2020-08-12 LAB — POCT PREGNANCY, URINE: Preg Test, Ur: NEGATIVE

## 2020-08-12 SURGERY — DILATATION AND CURETTAGE /HYSTEROSCOPY
Anesthesia: General | Site: Uterus

## 2020-08-12 MED ORDER — PROPOFOL 500 MG/50ML IV EMUL
INTRAVENOUS | Status: AC
  Filled 2020-08-12: qty 50

## 2020-08-12 MED ORDER — PROMETHAZINE HCL 25 MG/ML IJ SOLN: 6.2500 mg | INTRAMUSCULAR | Status: DC | PRN

## 2020-08-12 MED ORDER — CELECOXIB 200 MG PO CAPS
ORAL_CAPSULE | ORAL | Status: AC
  Filled 2020-08-12: qty 1

## 2020-08-12 MED ORDER — FENTANYL CITRATE (PF) 100 MCG/2ML IJ SOLN
INTRAMUSCULAR | Status: DC | PRN
  Administered 2020-08-12 (×2): 50 ug via INTRAVENOUS

## 2020-08-12 MED ORDER — LIDOCAINE HCL (PF) 2 % IJ SOLN
INTRAMUSCULAR | Status: AC
  Filled 2020-08-12: qty 5

## 2020-08-12 MED ORDER — SILVER NITRATE-POT NITRATE 75-25 % EX MISC
CUTANEOUS | Status: DC | PRN
  Administered 2020-08-12 (×2): 1

## 2020-08-12 MED ORDER — MIDAZOLAM HCL 2 MG/2ML IJ SOLN
INTRAMUSCULAR | Status: AC
  Filled 2020-08-12: qty 2

## 2020-08-12 MED ORDER — MIDAZOLAM HCL 5 MG/5ML IJ SOLN
INTRAMUSCULAR | Status: DC | PRN
  Administered 2020-08-12: 2 mg via INTRAVENOUS

## 2020-08-12 MED ORDER — PROPOFOL 10 MG/ML IV BOLUS
INTRAVENOUS | Status: DC | PRN
  Administered 2020-08-12: 200 mg via INTRAVENOUS

## 2020-08-12 MED ORDER — KETOROLAC TROMETHAMINE 30 MG/ML IJ SOLN
INTRAMUSCULAR | Status: AC
  Filled 2020-08-12: qty 1

## 2020-08-12 MED ORDER — VASOPRESSIN 20 UNIT/ML IV SOLN
INTRAVENOUS | Status: AC
  Filled 2020-08-12: qty 2

## 2020-08-12 MED ORDER — ACETAMINOPHEN 500 MG PO TABS
1000.0000 mg | ORAL_TABLET | ORAL | Status: AC
  Administered 2020-08-12: 06:00:00 1000 mg via ORAL

## 2020-08-12 MED ORDER — CELECOXIB 200 MG PO CAPS
200.0000 mg | ORAL_CAPSULE | Freq: Once | ORAL | Status: AC
  Administered 2020-08-12: 200 mg via ORAL

## 2020-08-12 MED ORDER — ONDANSETRON HCL 4 MG/2ML IJ SOLN
INTRAMUSCULAR | Status: DC | PRN
  Administered 2020-08-12: 4 mg via INTRAVENOUS

## 2020-08-12 MED ORDER — ONDANSETRON HCL 4 MG/2ML IJ SOLN
INTRAMUSCULAR | Status: AC
  Filled 2020-08-12: qty 2

## 2020-08-12 MED ORDER — SILVER NITRATE-POT NITRATE 75-25 % EX MISC
CUTANEOUS | Status: AC
  Filled 2020-08-12: qty 10

## 2020-08-12 MED ORDER — LIDOCAINE 2% (20 MG/ML) 5 ML SYRINGE
INTRAMUSCULAR | Status: DC | PRN
  Administered 2020-08-12: 100 mg via INTRAVENOUS

## 2020-08-12 MED ORDER — OXYCODONE HCL 5 MG PO TABS
5.0000 mg | ORAL_TABLET | Freq: Once | ORAL | Status: AC
  Administered 2020-08-12: 5 mg via ORAL

## 2020-08-12 MED ORDER — SODIUM CHLORIDE 0.9 % IV SOLN
INTRAVENOUS | Status: AC | PRN
  Administered 2020-08-12: 3000 mL

## 2020-08-12 MED ORDER — POVIDONE-IODINE 10 % EX SWAB: 2.0000 "application " | Freq: Once | CUTANEOUS | Status: DC

## 2020-08-12 MED ORDER — DEXAMETHASONE SODIUM PHOSPHATE 4 MG/ML IJ SOLN
INTRAMUSCULAR | Status: DC | PRN
  Administered 2020-08-12: 4 mg via INTRAVENOUS

## 2020-08-12 MED ORDER — AMISULPRIDE (ANTIEMETIC) 5 MG/2ML IV SOLN: 10.0000 mg | Freq: Once | INTRAVENOUS | Status: DC | PRN

## 2020-08-12 MED ORDER — DEXAMETHASONE SODIUM PHOSPHATE 10 MG/ML IJ SOLN
INTRAMUSCULAR | Status: AC
  Filled 2020-08-12: qty 1

## 2020-08-12 MED ORDER — FENTANYL CITRATE (PF) 100 MCG/2ML IJ SOLN: 25.0000 ug | INTRAMUSCULAR | Status: DC | PRN

## 2020-08-12 MED ORDER — LACTATED RINGERS IV SOLN: INTRAVENOUS | Status: DC

## 2020-08-12 MED ORDER — KETOROLAC TROMETHAMINE 30 MG/ML IJ SOLN
INTRAMUSCULAR | Status: DC | PRN
  Administered 2020-08-12: 30 mg via INTRAVENOUS

## 2020-08-12 MED ORDER — OXYCODONE HCL 5 MG PO TABS
ORAL_TABLET | ORAL | Status: AC
  Filled 2020-08-12: qty 1

## 2020-08-12 MED ORDER — ACETAMINOPHEN 500 MG PO TABS
ORAL_TABLET | ORAL | Status: AC
  Filled 2020-08-12: qty 2

## 2020-08-12 MED ORDER — FENTANYL CITRATE (PF) 100 MCG/2ML IJ SOLN
INTRAMUSCULAR | Status: AC
  Filled 2020-08-12: qty 2

## 2020-08-12 SURGICAL SUPPLY — 24 items
CATH ROBINSON RED A/P 16FR (CATHETERS) IMPLANT
COVER WAND RF STERILE (DRAPES) ×3 IMPLANT
DEVICE MYOSURE LITE (MISCELLANEOUS) IMPLANT
DEVICE MYOSURE REACH (MISCELLANEOUS) IMPLANT
DILATOR CANAL MILEX (MISCELLANEOUS) IMPLANT
GAUZE 4X4 16PLY RFD (DISPOSABLE) IMPLANT
GAUZE 4X4 16PLY ~~LOC~~+RFID DBL (SPONGE) ×3 IMPLANT
GLOVE SURG ENC MOIS LTX SZ6.5 (GLOVE) ×3 IMPLANT
GLOVE SURG UNDER POLY LF SZ7 (GLOVE) ×3 IMPLANT
GOWN STRL REUS W/TWL LRG LVL3 (GOWN DISPOSABLE) ×6 IMPLANT
IV NS IRRIG 3000ML ARTHROMATIC (IV SOLUTION) ×3 IMPLANT
KIT PROCEDURE FLUENT (KITS) ×3 IMPLANT
KIT TURNOVER CYSTO (KITS) ×3 IMPLANT
MYOSURE XL FIBROID (MISCELLANEOUS)
PACK VAGINAL MINOR WOMEN LF (CUSTOM PROCEDURE TRAY) ×3 IMPLANT
PAD OB MATERNITY 4.3X12.25 (PERSONAL CARE ITEMS) ×3 IMPLANT
PAD PREP 24X48 CUFFED NSTRL (MISCELLANEOUS) ×3 IMPLANT
SEAL CERVICAL OMNI LOK (ABLATOR) IMPLANT
SEAL ROD LENS SCOPE MYOSURE (ABLATOR) ×3 IMPLANT
SUT VIC AB 2-0 CT1 (SUTURE) ×3 IMPLANT
SYR 20ML LL LF (SYRINGE) IMPLANT
SYSTEM TISS REMOVAL MYOSURE XL (MISCELLANEOUS) IMPLANT
TOWEL OR 17X26 10 PK STRL BLUE (TOWEL DISPOSABLE) ×3 IMPLANT
WATER STERILE IRR 500ML POUR (IV SOLUTION) IMPLANT

## 2020-08-12 NOTE — Anesthesia Postprocedure Evaluation (Signed)
Anesthesia Post Note  Patient: Alaisa Moffitt  Procedure(s) Performed: DILATATION AND CURETTAGE /HYSTEROSCOPY (Uterus)     Patient location during evaluation: PACU Anesthesia Type: General Level of consciousness: sedated Pain management: pain level controlled Vital Signs Assessment: post-procedure vital signs reviewed and stable Respiratory status: spontaneous breathing and respiratory function stable Cardiovascular status: stable Postop Assessment: no apparent nausea or vomiting Anesthetic complications: no   No notable events documented.  Last Vitals:  Vitals:   08/12/20 0915 08/12/20 0930  BP: 140/90 (!) 145/81  Pulse: 72 70  Resp: 15 14  Temp:    SpO2: 96% 95%    Last Pain:  Vitals:   08/12/20 0930  TempSrc:   PainSc: 5                  Wylee Dorantes DANIEL

## 2020-08-12 NOTE — Transfer of Care (Signed)
Immediate Anesthesia Transfer of Care Note  Patient: Chloe Cervantes  Procedure(s) Performed: DILATATION AND CURETTAGE /HYSTEROSCOPY (Uterus)  Patient Location: PACU  Anesthesia Type:General  Level of Consciousness: sedated  Airway & Oxygen Therapy: Patient Spontanous Breathing and Patient connected to face mask oxygen  Post-op Assessment: Report given to RN and Post -op Vital signs reviewed and stable  Post vital signs: Reviewed and stable  Last Vitals:  Vitals Value Taken Time  BP 143/77 08/12/20 0825  Temp 36.3 C 08/12/20 0817  Pulse 85 08/12/20 0825  Resp 21 08/12/20 0825  SpO2 100 % 08/12/20 0825  Vitals shown include unvalidated device data.  Last Pain:  Vitals:   08/12/20 0555  TempSrc: Oral  PainSc: 6       Patients Stated Pain Goal: 6 (08/12/20 0555)  Complications: No notable events documented.

## 2020-08-12 NOTE — Interval H&P Note (Signed)
History and Physical Interval Note:  08/12/2020 7:14 AM  Chloe Cervantes  has presented today for surgery, with the diagnosis of abnormal uterine bleeding, thickened endometrium.  The various methods of treatment have been discussed with the patient and family. After consideration of risks, benefits and other options for treatment, the patient has consented to  Procedure(s): DILATATION AND CURETTAGE /HYSTEROSCOPY (N/A) as a surgical intervention.  The patient's history has been reviewed, patient examined, no change in status, stable for surgery.  I have reviewed the patient's chart and labs.  Questions were answered to the patient's satisfaction.     Romualdo Bolk

## 2020-08-12 NOTE — Op Note (Signed)
Preoperative Diagnosis: Abnormal uterine bleeding  Postoperative Diagnosis: Same  Procedure: Hysteroscopy, dilation and curettage  Surgeon: Dr Gertie Exon  Assistants: None  Anesthesia: General via LMA  EBL: 5 cc  Fluids: 500 cc NS  Fluid deficit: 55 cc  Urine output: not recorded  Indications for surgery: The patient is a 30 yo female, who presented with abnormal uterine bleeding after a year of amenorrhea. Work up included an ultrasound with a thickened endometrium, sonohysterogram with thickened irregular endometrium, endometrial biopsy with polypoid endometrium The risks of the surgery were reviewed with the patient and the consent form was signed prior to her surgery.  Findings: Slightly thickened endometrium, no discrete polyps, normal tubal ostia bilaterally  Specimens: Endometrial curettings   Procedure: The patient was taken to the operating room with an IV in place. She was placed in the dorsal lithotomy position and anesthesia was administered. She was prepped and draped in the usual sterile fashion for a vaginal procedure. She voided on the way to the OR. A weighted speculum was placed in the vagina and a single tooth tenaculum was placed on the anterior lip of the cervix. The cervix was dilated to a #6 hagar dilator. The uterus was sounded to 8 cm. The myosure hysteroscope was inserted into the uterine cavity. With continuous infusion of normal saline, the uterine cavity was visualized with the above findings. The myosure was then removed. The cavity was then curetted with the small sharp curette, a large amount of tissue was removed. The cavity had the characteristically gritty texture at the end of the procedure. The curette was removed and the hysteroscope was reinserted. The cavity had the typical shaggy appearance post curettage. The hysteroscope and the single tooth tenaculum were removed. She was bleeding from the right tenaculum site, it continued to bleed lightly  after pressure and application of silver nitrate. A figure of 8 stitch of 0 Vicryl was placed around the tenaculum site with excellent hemostasis. The speculum was removed. The patients perineum was cleansed of betadine and she was taken out of the dorsal lithotomy position.  Upon awakening the LMA was removed and the patient was transferred to the recovery room in stable and awake condition.  The sponge and instrument count were correct. There were no complications.

## 2020-08-12 NOTE — Anesthesia Procedure Notes (Signed)
Procedure Name: LMA Insertion Date/Time: 08/12/2020 7:30 AM Performed by: Burna Cash, CRNA Pre-anesthesia Checklist: Patient identified, Emergency Drugs available, Suction available and Patient being monitored Patient Re-evaluated:Patient Re-evaluated prior to induction Oxygen Delivery Method: Circle system utilized Preoxygenation: Pre-oxygenation with 100% oxygen Induction Type: IV induction Ventilation: Mask ventilation without difficulty LMA: LMA inserted LMA Size: 4.0 Number of attempts: 1 Airway Equipment and Method: Bite block Placement Confirmation: positive ETCO2 Tube secured with: Tape Dental Injury: Teeth and Oropharynx as per pre-operative assessment

## 2020-08-12 NOTE — Discharge Instructions (Addendum)
  Post Anesthesia Home Care Instructions  Activity: Get plenty of rest for the remainder of the day. A responsible individual must stay with you for 24 hours following the procedure.  For the next 24 hours, DO NOT: -Drive a car -Advertising copywriter -Drink alcoholic beverages -Take any medication unless instructed by your physician -Make any legal decisions or sign important papers.  Meals: Start with liquid foods such as gelatin or soup. Progress to regular foods as tolerated. Avoid greasy, spicy, heavy foods. If nausea and/or vomiting occur, drink only clear liquids until the nausea and/or vomiting subsides. Call your physician if vomiting continues.  Special Instructions/Symptoms: Your throat may feel dry or sore from the anesthesia or the breathing tube placed in your throat during surgery. If this causes discomfort, gargle with warm salt water. The discomfort should disappear within 24 hours.  DISCHARGE INSTRUCTIONS: HYSTEROSCOPY / ENDOMETRIAL ABLATION The following instructions have been prepared to help you care for yourself upon your return home. May take Ibuprofen after 2 PM.  Drink plenty of water.  Personal hygiene:  Use sanitary pads for vaginal drainage, not tampons.  Shower the day after your procedure.  NO tub baths, pools or Jacuzzis for 2-3 weeks.  Wipe front to back after using the bathroom.  Activity and limitations:  Do NOT drive or operate any equipment for 24 hours. The effects of anesthesia are still present and drowsiness may result.  Do NOT rest in bed all day.  Walking is encouraged.  Walk up and down stairs slowly.  You may resume your normal activity in one to two days or as indicated by your physician. Sexual activity: NO intercourse for at least 2 weeks after the procedure, or as indicated by your Doctor.  Diet: Eat a light meal as desired this evening. You may resume your usual diet tomorrow.  Return to Work: You may resume your work activities in  one to two days or as indicated by Therapist, sports.  What to expect after your surgery: Expect to have vaginal bleeding/discharge for 2-3 days and spotting for up to 10 days. It is not unusual to have soreness for up to 1-2 weeks. You may have a slight burning sensation when you urinate for the first day. Mild cramps may continue for a couple of days. You may have a regular period in 2-6 weeks.  Call your doctor for any of the following:  Excessive vaginal bleeding or clotting, saturating and changing one pad every hour.  Inability to urinate 6 hours after discharge from hospital.  Pain not relieved by pain medication.  Fever of 100.4 F or greater.  Unusual vaginal discharge or odor.    May take Tylenol at 55 Noon as needed for pain.

## 2020-08-13 ENCOUNTER — Encounter (HOSPITAL_BASED_OUTPATIENT_CLINIC_OR_DEPARTMENT_OTHER): Payer: Self-pay | Admitting: Obstetrics and Gynecology

## 2020-08-13 LAB — SURGICAL PATHOLOGY

## 2020-08-25 ENCOUNTER — Ambulatory Visit (INDEPENDENT_AMBULATORY_CARE_PROVIDER_SITE_OTHER): Payer: BC Managed Care – PPO | Admitting: Obstetrics and Gynecology

## 2020-08-25 ENCOUNTER — Encounter: Payer: Self-pay | Admitting: Obstetrics and Gynecology

## 2020-08-25 ENCOUNTER — Other Ambulatory Visit: Payer: Self-pay

## 2020-08-25 VITALS — BP 132/74 | HR 92 | Ht 61.5 in | Wt 220.0 lb

## 2020-08-25 DIAGNOSIS — N915 Oligomenorrhea, unspecified: Secondary | ICD-10-CM

## 2020-08-25 DIAGNOSIS — N970 Female infertility associated with anovulation: Secondary | ICD-10-CM | POA: Diagnosis not present

## 2020-08-25 DIAGNOSIS — Z9889 Other specified postprocedural states: Secondary | ICD-10-CM

## 2020-08-25 MED ORDER — MEDROXYPROGESTERONE ACETATE 5 MG PO TABS: 5.0000 mg | ORAL_TABLET | Freq: Every day | ORAL | 2 refills | Status: DC

## 2020-08-25 NOTE — Progress Notes (Addendum)
GYNECOLOGY  VISIT   HPI: 30 y.o.   Married Other or two or more races Hispanic or Latino  female   [phone] with No LMP recorded. (Menstrual status: Irregular Periods).   here for a post op check s/p hysteroscopy, D&C 2 weeks ago. Pathology returned with benign endometrial polyp and benign proliferative endometrium. She had light bleeding to spotting for 1.5 weeks after the procedure.   She has a h/o PCOS and presented earlier this year with AUB after a year of amenorrhea. She wants to get pregnant.    She has a h/o prediabetes. She tried metformin for a few month. She did have a few cycles after starting the metformin.  She has been with her husband for 6 years, never used contraception. Youngest child is 5 (with her husband). Since having her she hasn't had any regular cycles.  She had unprotected intercourse yesterday.     GYNECOLOGIC HISTORY: No LMP recorded. (Menstrual status: Irregular Periods). Contraception:Condoms  Menopausal hormone therapy: none         OB History     Gravida  3   Para  2   Term  2   Preterm      AB  1   Living  2      SAB  1   IAB      Ectopic      Multiple      Live Births                 Patient Active Problem List   Diagnosis Date Noted   Prediabetes    Femoral acetabular impingement 02/29/2020   PCOS (polycystic ovarian syndrome) 04/07/2018   Anxiety and depression 04/07/2018   Iron deficiency anemia 04/07/2018   Abnormal uterine bleeding (AUB) 01/21/2017    Past Medical History:  Diagnosis Date   Abnormal uterine bleeding (AUB)    Arthritis    HIPS   Complication of anesthesia    hard to wake   Depression    Endometrial polyp    GERD (gastroesophageal reflux disease)    History of 2019 novel coronavirus disease (COVID-19) 01/2020   per pt mild symptoms that resolved   History of gestational diabetes    History of pregnancy induced hypertension    IDA (iron deficiency anemia)    PCOS (polycystic ovarian  syndrome)    Prediabetes    Wears glasses     Past Surgical History:  Procedure Laterality Date   HYSTEROSCOPY WITH D & C N/A 10/17/2018   Procedure: DILATION AND CURRETAGE WITH HYSTEROSCOPY;  Surgeon: Dara Lords, MD;  Location: [facility];  Service: Gynecology;  Laterality: N/A;   HYSTEROSCOPY WITH D & C N/A 08/12/2020   Procedure: DILATATION AND CURETTAGE /HYSTEROSCOPY;  Surgeon: Romualdo Bolk, MD;  Location: [facility];  Service: Gynecology;  Laterality: N/A;   LAPAROSCOPY Left 10/17/2018   Procedure: LAPAROSCOPIC  REMOVAL OF PARATUBAL CYST;  Surgeon: Dara Lords, MD;  Location: [facility];  Service: Gynecology;  Laterality: Left;   WISDOM TOOTH EXTRACTION     TEEN    No current outpatient medications on file.   No current facility-administered medications for this visit.     ALLERGIES: Patient has no known allergies.  Family History  Problem Relation Age of Onset   Diabetes Mother    Hypertension Mother    Hyperlipidemia Mother     Social History   Socioeconomic History   Marital status: Married  Spouse name: Not on file   Number of children: Not on file   Years of education: Not on file   Highest education level: Not on file  Occupational History   Not on file  Tobacco Use   Smoking status: Never   Smokeless tobacco: Never  Vaping Use   Vaping Use: Never used  Substance and Sexual Activity   Alcohol use: Yes    Comment: Occasional   Drug use: Never   Sexual activity: Yes    Birth control/protection: Condom    Comment: 1st intercourse 30 yo-More than 5 partners  Other Topics Concern   Not on file  Social History Narrative   Not on file   Social Determinants of Health   Financial Resource Strain: Not on file  Food Insecurity: Not on file  Transportation Needs: Not on file  Physical Activity: Not on file  Stress: Not on file  Social Connections: Not on file  Intimate Partner  Violence: Not on file    Review of Systems  All other systems reviewed and are negative.  PHYSICAL EXAMINATION:    BP 132/74   Pulse 92   Ht 5' 1.5" (1.562 m)   Wt 220 lb (99.8 kg)   SpO2 99%   BMI 40.90 kg/m     General appearance: alert, cooperative and appears stated age  66. History of hysteroscopy Benign pathology, doing well  2. Oligomenorrhea, unspecified type From anovulation, PCOS She will abstain from intercourse or use condoms for 2 weeks, then take a pregnancy test. If negative she will start provera - medroxyPROGESTERone (PROVERA) 5 MG tablet; Take 1 tablet (5 mg total) by mouth daily.  Dispense: 5 tablet; Refill: 2  3. Infertility associated with anovulation She would like to start treatment. Will start letrozole, she will call on her cycle and I will call the medication in for her.. Discussed that letrozole has been shown to have better results than clomid in ovulation induction and achieving pregnancy is women with PCOS. We discussed that letrozole is not FDA approved for ovulation induction. Discussed that there is limited data on risks of letrozole if taken while pregnant and therefore the need to have a negative pregnancy test prior to starting.   Over 30 minutes was spent in to tal patient care.   Addendum: she will do BBT charts and come in to review ~3 weeks after taking the letrozole.

## 2020-08-25 NOTE — Patient Instructions (Addendum)
No unprotected intercourse x 2 weeks  Check a pregnancy test, if negative, start provera  Call with cycle and I will call in the letrozole for you if you. If you don't bleed within a week of ending the provera call the office  Start the BBT chart as soon as you finish the letrozole  Have intercourse every 24-36 hours starting 2-3 days after finishing the letrozole

## 2020-08-27 ENCOUNTER — Encounter: Payer: Self-pay | Admitting: Obstetrics and Gynecology

## 2020-11-07 ENCOUNTER — Encounter: Payer: Self-pay | Admitting: Family Medicine

## 2020-11-07 ENCOUNTER — Ambulatory Visit (INDEPENDENT_AMBULATORY_CARE_PROVIDER_SITE_OTHER): Payer: BC Managed Care – PPO | Admitting: Family Medicine

## 2020-11-07 ENCOUNTER — Other Ambulatory Visit: Payer: Self-pay

## 2020-11-07 VITALS — BP 132/82 | HR 77 | Temp 98.6°F | Ht 62.0 in | Wt 225.8 lb

## 2020-11-07 DIAGNOSIS — F419 Anxiety disorder, unspecified: Secondary | ICD-10-CM | POA: Diagnosis not present

## 2020-11-07 DIAGNOSIS — Z23 Encounter for immunization: Secondary | ICD-10-CM | POA: Diagnosis not present

## 2020-11-07 DIAGNOSIS — R635 Abnormal weight gain: Secondary | ICD-10-CM | POA: Diagnosis not present

## 2020-11-07 DIAGNOSIS — M25552 Pain in left hip: Secondary | ICD-10-CM

## 2020-11-07 DIAGNOSIS — M25551 Pain in right hip: Secondary | ICD-10-CM | POA: Diagnosis not present

## 2020-11-07 DIAGNOSIS — F321 Major depressive disorder, single episode, moderate: Secondary | ICD-10-CM

## 2020-11-07 DIAGNOSIS — L309 Dermatitis, unspecified: Secondary | ICD-10-CM

## 2020-11-07 DIAGNOSIS — L813 Cafe au lait spots: Secondary | ICD-10-CM | POA: Diagnosis not present

## 2020-11-07 DIAGNOSIS — Z0000 Encounter for general adult medical examination without abnormal findings: Secondary | ICD-10-CM

## 2020-11-07 DIAGNOSIS — R102 Pelvic and perineal pain: Secondary | ICD-10-CM | POA: Diagnosis not present

## 2020-11-07 LAB — CBC WITH DIFFERENTIAL/PLATELET
Basophils Absolute: 0.1 10*3/uL (ref 0.0–0.1)
Basophils Relative: 0.8 % (ref 0.0–3.0)
Eosinophils Absolute: 0.1 10*3/uL (ref 0.0–0.7)
Eosinophils Relative: 1.8 % (ref 0.0–5.0)
HCT: 34.5 % — ABNORMAL LOW (ref 36.0–46.0)
Hemoglobin: 11 g/dL — ABNORMAL LOW (ref 12.0–15.0)
Lymphocytes Relative: 39.2 % (ref 12.0–46.0)
Lymphs Abs: 2.9 10*3/uL (ref 0.7–4.0)
MCHC: 31.9 g/dL (ref 30.0–36.0)
MCV: 76.8 fl — ABNORMAL LOW (ref 78.0–100.0)
Monocytes Absolute: 0.4 10*3/uL (ref 0.1–1.0)
Monocytes Relative: 5.3 % (ref 3.0–12.0)
Neutro Abs: 4 10*3/uL (ref 1.4–7.7)
Neutrophils Relative %: 52.9 % (ref 43.0–77.0)
Platelets: 455 10*3/uL — ABNORMAL HIGH (ref 150.0–400.0)
RBC: 4.49 Mil/uL (ref 3.87–5.11)
RDW: 16 % — ABNORMAL HIGH (ref 11.5–15.5)
WBC: 7.5 10*3/uL (ref 4.0–10.5)

## 2020-11-07 LAB — LIPID PANEL
Cholesterol: 176 mg/dL (ref 0–200)
HDL: 53.6 mg/dL (ref >39.00)
LDL Cholesterol: 106 mg/dL — ABNORMAL HIGH (ref 0–99)
NonHDL: 122.57
Total CHOL/HDL Ratio: 3
Triglycerides: 81 mg/dL (ref 0.0–149.0)
VLDL: 16.2 mg/dL (ref 0.0–40.0)

## 2020-11-07 LAB — VITAMIN D 25 HYDROXY (VIT D DEFICIENCY, FRACTURES): VITD: 9.92 ng/mL — ABNORMAL LOW (ref 30.00–100.00)

## 2020-11-07 LAB — POCT URINALYSIS DIPSTICK
Bilirubin, UA: NEGATIVE
Glucose, UA: NEGATIVE
Ketones, UA: NEGATIVE
Nitrite, UA: NEGATIVE
Protein, UA: POSITIVE — AB
Spec Grav, UA: >=1.03 — AB (ref 1.010–1.025)
Urobilinogen, UA: NEGATIVE E.U./dL — AB
pH, UA: 5.5 (ref 5.0–8.0)

## 2020-11-07 LAB — BASIC METABOLIC PANEL
BUN: 12 mg/dL (ref 6–23)
CO2: 27 mEq/L (ref 19–32)
Calcium: 9.1 mg/dL (ref 8.4–10.5)
Chloride: 102 mEq/L (ref 96–112)
Creatinine, Ser: 0.6 mg/dL (ref 0.40–1.20)
GFR: 122.43 mL/min (ref >60.00)
Glucose, Bld: 115 mg/dL — ABNORMAL HIGH (ref 70–99)
Potassium: 3.8 mEq/L (ref 3.5–5.1)
Sodium: 137 mEq/L (ref 135–145)

## 2020-11-07 LAB — TSH: TSH: 1.74 u[IU]/mL (ref 0.35–5.50)

## 2020-11-07 LAB — T4, FREE: Free T4: 0.75 ng/dL (ref 0.60–1.60)

## 2020-11-07 LAB — HEMOGLOBIN A1C: Hgb A1c MFr Bld: 6.8 % — ABNORMAL HIGH (ref 4.6–6.5)

## 2020-11-07 MED ORDER — CLOTRIMAZOLE 1 % EX CREA: 1.0000 "application " | TOPICAL_CREAM | Freq: Two times a day (BID) | CUTANEOUS | 0 refills | Status: AC

## 2020-11-07 NOTE — Patient Instructions (Signed)
Behavioral Health Services: -to make an appointment contact the office/provider you are interested in seeing.  No referral is needed.  The below is not an all inclusive list, but will help you get started.  [email or web address] -counseling located off of [address]  [email or web address] -website helps you find providers in your area  [facility] -Located off of [address] across from [employer]  Dr. Akintayo is a Psychiatrist with [place]. [phone]  [facility]  [facility]  -[address]  [phone] -a place in town that has counseling and Psychiatry services.    

## 2020-11-07 NOTE — Progress Notes (Signed)
Subjective:     Chloe Cervantes is a 30 y.o. female and is here for a comprehensive physical exam. The patient reportsPruritic erythematous area on L hand  and light spots on UEs and sternum.  Noticed a few wks ago.  Pt unsure about fam hx of any similar lesions.  Pt with continued b/l hip pain 2/2 torn labrum per Ortho.  Advised to lose weight in order to have surgery.  Exercising regularly and has a trainer but gaining wt. Feeling depressed.  Sleeping more, decreased appetite.  Mind races at night.  Was on Zoloft as a child.  Social History   Socioeconomic History   Marital status: Married    Spouse name: Not on file   Number of children: Not on file   Years of education: Not on file   Highest education level: Not on file  Occupational History   Not on file  Tobacco Use   Smoking status: Never   Smokeless tobacco: Never  Vaping Use   Vaping Use: Never used  Substance and Sexual Activity   Alcohol use: Yes    Comment: Occasional   Drug use: Never   Sexual activity: Yes    Birth control/protection: Condom    Comment: 1st intercourse 30 yo-More than 5 partners  Other Topics Concern   Not on file  Social History Narrative   Not on file   Social Determinants of Health   Financial Resource Strain: Not on file  Food Insecurity: Not on file  Transportation Needs: Not on file  Physical Activity: Not on file  Stress: Not on file  Social Connections: Not on file  Intimate Partner Violence: Not on file   Health Maintenance  Topic Date Due   Hepatitis C Screening  Never done   TETANUS/TDAP  Never done   PAP-Cervical Cytology Screening  02/22/2020   PAP SMEAR-Modifier  02/22/2020   INFLUENZA VACCINE  09/15/2020   COVID-19 Vaccine (4 - Booster for Pfizer series) 10/01/2020   HIV Screening  Completed   HPV VACCINES  Aged Out    The following portions of the patient's history were reviewed and updated as appropriate: allergies, current medications, past family history,  past medical history, past social history, past surgical history, and problem list.  Review of Systems Pertinent items noted in HPI and remainder of comprehensive ROS otherwise negative.   Objective:    BP 132/82 (BP Location: Right Arm, Patient Position: Sitting, Cuff Size: Large)   Pulse 77   Temp 98.6 F (37 C) (Oral)   Ht 5\' 2"  (1.575 m)   Wt 225 lb 12.8 oz (102.4 kg)   LMP 11/06/2020   SpO2 99%   BMI 41.30 kg/m  General appearance: alert, cooperative, no distress, and mildy depressed affect Head: Normocephalic, without obvious abnormality, atraumatic Eyes: conjunctivae/corneas clear. PERRL, EOM's intact. Fundi benign. Ears: normal TM's and external ear canals both ears Nose: Nares normal. Septum midline. Mucosa normal. No drainage or sinus tenderness. Throat: lips, mucosa, and tongue normal; teeth and gums normal Neck: no adenopathy, no carotid bruit, no JVD, supple, symmetrical, trachea midline, and thyroid not enlarged, symmetric, no tenderness/mass/nodules Lungs: clear to auscultation bilaterally Heart: regular rate and rhythm, S1, S2 normal, no murmur, click, rub or gallop Abdomen: soft, non-tender; bowel sounds normal; no masses,  no organomegaly Extremities: extremities normal, atraumatic, no cyanosis or edema Pulses: 2+ and symmetric Skin: Skin color, texture, turgor normal.  Hypopigmented circular areas on bilateral UEs and between breast.  Erythematous slightly raised  area on dorsum of left hand. Lymph nodes: Cervical, supraclavicular, and axillary nodes normal. Neurologic: Alert and oriented X 3, normal strength and tone. Normal symmetric reflexes. Normal coordination and gait  Depression screen [facility] 2/9 11/07/2020 09/13/2019 04/07/2018  Decreased Interest 2 1 1   Down, Depressed, Hopeless 2 0 3  PHQ - 2 Score 4 1 4   Altered sleeping 3 3 3   Tired, decreased energy 3 2 3   Change in appetite 3 3 3   Feeling bad or failure about yourself  2 0 3  Trouble concentrating 1 0  1  Moving slowly or fidgety/restless 0 0 1  Suicidal thoughts 0 0 2  PHQ-9 Score 16 9 20   Difficult doing work/chores Extremely dIfficult Somewhat difficult Extremely dIfficult   GAD 7 : Generalized Anxiety Score 11/07/2020 09/13/2019 04/07/2018 02/21/2017  Nervous, Anxious, on Edge 3 1 2 1   Control/stop worrying 3 1 3 1   Worry too much - different things 3 1 3 1   Trouble relaxing 3 0 2 0  Restless 0 0 2 0  Easily annoyed or irritable 3 3 3 2   Afraid - awful might happen 3 1 3  0  Total GAD 7 Score 18 7 18 5   Anxiety Difficulty Very difficult Somewhat difficult Very difficult -       Assessment:    Healthy female exam with depression and anxiety symptoms, rash.   Plan:    Anticipatory guidance given including wearing seatbelts, smoke detectors in the home, increasing physical activity, increasing p.o. intake of water and vegetables. -We will obtain labs -Followed by OB/GYN for Pap -Next CPE in 1 year See After Visit Summary for Counseling Recommendations   Weight gain -Discussed possible causes including depression, thyroid dysfunction -Continue lifestyle modifications - Plan: TSH, T4, Free, Hemoglobin A1c, Lipid panel, Vitamin D, 25-hydroxy  Cafe au lait spots  - Plan: Ambulatory referral to Dermatology  Depression, major, single episode, moderate (HCC) -PHQ-9 score 16 -GAD-7 score 18 -Discussed counseling and medication options -[facility].  Patient would like to wait on medication at this time -Continue to monitor/given strict precautions - Plan: TSH, T4, Free  Anxiety -PHQ-9 score 16 -GAD-7 score 18 -Discussed counseling and medication options. -Given info on area BH providers - Plan: TSH, T4, Free  Need for influenza vaccination  - Plan: Flu Vaccine QUAD 6+ mos PF IM (Fluarix Quad PF)  Bilateral hip pain -Continue follow-up with Ortho -Discussed various ways to manage pain - Plan: CBC with Differential/Platelet  Suprapubic  pain -Intermittent.  Currently asymptomatic.   -UA with 1+ leuks, 2+ protein, 3+ blood, SG 1.030 -We will obtain culture -If needed based on culture results we will send in Rx for ABX  - Plan: POCT urinalysis dipstick, Culture, Urine  Dermatitis -For continued or worsening rash on dorsum of left hand follow-up with Derm  - Plan: clotrimazole (LOTRIMIN) 1 % cream  Follow-up in 4-6 weeks  , MD

## 2020-11-07 NOTE — Addendum Note (Signed)
Addended by: Kandra Nicolas on: 11/07/2020 01:28 PM   Modules accepted: Orders

## 2020-11-08 LAB — URINE CULTURE
MICRO NUMBER:: 12415809
SPECIMEN QUALITY:: ADEQUATE

## 2020-11-12 ENCOUNTER — Telehealth: Payer: Self-pay | Admitting: Physician Assistant

## 2020-11-12 NOTE — Telephone Encounter (Signed)
Referral attached to appointment

## 2020-11-12 NOTE — Telephone Encounter (Signed)
Patient is calling for a referral appointment from Abbe Amsterdam, M.D.  Patient is scheduled for 05/27/2021 at 10:00 with [facility], PA-C.

## 2020-11-17 ENCOUNTER — Other Ambulatory Visit: Payer: Self-pay | Admitting: Family Medicine

## 2020-11-17 DIAGNOSIS — E559 Vitamin D deficiency, unspecified: Secondary | ICD-10-CM

## 2020-11-17 MED ORDER — VITAMIN D (ERGOCALCIFEROL) 1.25 MG (50000 UNIT) PO CAPS
50000.0000 [IU] | ORAL_CAPSULE | ORAL | 0 refills | Status: DC
Start: 2020-11-17 — End: 2021-04-20

## 2020-12-08 ENCOUNTER — Encounter: Payer: Self-pay | Admitting: Family Medicine

## 2020-12-08 ENCOUNTER — Other Ambulatory Visit: Payer: Self-pay | Admitting: Family Medicine

## 2020-12-08 ENCOUNTER — Ambulatory Visit (INDEPENDENT_AMBULATORY_CARE_PROVIDER_SITE_OTHER): Payer: BC Managed Care – PPO | Admitting: Family Medicine

## 2020-12-08 ENCOUNTER — Other Ambulatory Visit: Payer: Self-pay

## 2020-12-08 VITALS — BP 108/76 | HR 71 | Temp 98.4°F | Wt 221.0 lb

## 2020-12-08 DIAGNOSIS — F321 Major depressive disorder, single episode, moderate: Secondary | ICD-10-CM | POA: Diagnosis not present

## 2020-12-08 DIAGNOSIS — W57XXXA Bitten or stung by nonvenomous insect and other nonvenomous arthropods, initial encounter: Secondary | ICD-10-CM

## 2020-12-08 DIAGNOSIS — E282 Polycystic ovarian syndrome: Secondary | ICD-10-CM

## 2020-12-08 DIAGNOSIS — F419 Anxiety disorder, unspecified: Secondary | ICD-10-CM | POA: Diagnosis not present

## 2020-12-08 DIAGNOSIS — M25859 Other specified joint disorders, unspecified hip: Secondary | ICD-10-CM

## 2020-12-08 DIAGNOSIS — M25551 Pain in right hip: Secondary | ICD-10-CM

## 2020-12-08 DIAGNOSIS — M25552 Pain in left hip: Secondary | ICD-10-CM

## 2020-12-08 DIAGNOSIS — E119 Type 2 diabetes mellitus without complications: Secondary | ICD-10-CM

## 2020-12-08 DIAGNOSIS — S30860A Insect bite (nonvenomous) of lower back and pelvis, initial encounter: Secondary | ICD-10-CM

## 2020-12-08 MED ORDER — METFORMIN HCL 500 MG PO TABS: 500.0000 mg | ORAL_TABLET | Freq: Every day | ORAL | 3 refills | Status: DC

## 2020-12-08 NOTE — Progress Notes (Signed)
Subjective:    Patient ID: Chloe Cervantes, female    DOB: 1990/04/03, 30 y.o.   MRN: [id number]  No chief complaint on file.   HPI Patient was seen today for f/u on anxiety and depression.  At last OFV, pt endorses sleeping more, decreased appetite, and her mind racing at night.  As a child pt was on Zoloft, but it made her "not feel".   Exercising regularly.  Interested in counseling.  Plans to schedule an appt through her husband's job. HAs noticed some improvement in energy since starting ergocalciferol 50,000 IUs for vitamin D deficiency.  Pt inquires about weight.  States lost 5 lbs since last OFV 2/2 intermittent fasting.  Pt stopped drinking soda last wk.  Working out regularly, but notes b/l hip pain worse with cold weather.  Seen by [facility], but also seen by Dr. Aundria Rud, a specialist for larbal tears.  Pt advised to lose 20 lbs before she an get surgery.  Pt considering starting cross-fit.    Hgb A1C at last OFV was 6.8%.  Pt has a h/o PCOS, seen by OB/Gyn.  In the past was on metformin 500 mg, does not recall s/es.    Pt trying to see Derm for hypopigmented lesions on UEs, but can't be seen until April 2023.  Will check with another derm group in town to see if she can be seen sooner.  Tried steroid cream.  Notes new hypopigmented lesions on dorsum of L hand.  States daughter also has a hypopigmented lesion.  Pt notes pruritic spot on L lower back.  Thinks that something bit her.  Past Medical History:  Diagnosis Date   Abnormal uterine bleeding (AUB)    Arthritis    HIPS   Complication of anesthesia    hard to wake   Depression    Endometrial polyp    GERD (gastroesophageal reflux disease)    History of 2019 novel coronavirus disease (COVID-19) 01/2020   per pt mild symptoms that resolved   History of gestational diabetes    History of pregnancy induced hypertension    IDA (iron deficiency anemia)    PCOS (polycystic ovarian syndrome)    Prediabetes    Wears  glasses     No Known Allergies  ROS General: Denies fever, chills, night sweats, changes in weight, changes in appetite +changes in wt HEENT: Denies headaches, ear pain, changes in vision, rhinorrhea, sore throat CV: Denies CP, palpitations, SOB, orthopnea Pulm: Denies SOB, cough, wheezing GI: Denies abdominal pain, nausea, vomiting, diarrhea, constipation GU: Denies dysuria, hematuria, frequency, vaginal discharge Msk: Denies muscle cramps  +b/l hip pain Neuro: Denies weakness, numbness, tingling Skin: Denies rashes, bruising +hypopigmentation of skin.  Pruritic, erythematous areas on back Psych: Denies hallucinations  +anxiety, depression    Objective:    Blood pressure 108/76, pulse 71, temperature 98.4 F (36.9 C), temperature source Oral, weight 221 lb (100.2 kg), SpO2 99 %.  Gen. Pleasant, well-nourished, in no distress, normal affect   HEENT: Bernie/AT, face symmetric, conjunctiva clear, no scleral icterus, PERRLA, EOMI, nares patent without drainage Lungs: no accessory muscle use, CTAB, no wheezes or rales Cardiovascular: RRR, no peripheral edema Musculoskeletal: No deformities, no cyanosis or clubbing, normal tone Neuro:  A&Ox3, CN II-XII intact, normal gait Skin:  Warm, hypopigmented lesion on dorsum of left hand.  2 erythematous raised papules on posterior left lower lateral with central punctum.   Wt Readings from Last 3 Encounters:  12/08/20 221 lb (100.2 kg)  11/07/20  225 lb 12.8 oz (102.4 kg)  08/25/20 220 lb (99.8 kg)    Lab Results  Component Value Date   WBC 7.5 11/07/2020   HGB 11.0 (L) 11/07/2020   HCT 34.5 (L) 11/07/2020   PLT 455.0 (H) 11/07/2020   GLUCOSE 115 (H) 11/07/2020   CHOL 176 11/07/2020   TRIG 81.0 11/07/2020   HDL 53.60 11/07/2020   LDLCALC 106 (H) 11/07/2020   ALT 33 10/17/2018   AST 22 10/17/2018   NA 137 11/07/2020   K 3.8 11/07/2020   CL 102 11/07/2020   CREATININE 0.60 11/07/2020   BUN 12 11/07/2020   CO2 27 11/07/2020   TSH  1.74 11/07/2020   HGBA1C 6.8 (H) 11/07/2020   Depression screen PHQ 2/9 12/08/2020 11/07/2020 09/13/2019  Decreased Interest 2 2 1   Down, Depressed, Hopeless 3 2 0  PHQ - 2 Score 5 4 1   Altered sleeping 2 3 3   Tired, decreased energy 1 3 2   Change in appetite 1 3 3   Feeling bad or failure about yourself  2 2 0  Trouble concentrating 2 1 0  Moving slowly or fidgety/restless 0 0 0  Suicidal thoughts 0 0 0  PHQ-9 Score 13 16 9   Difficult doing work/chores Very difficult Extremely dIfficult Somewhat difficult   GAD 7 : Generalized Anxiety Score 12/08/2020 11/07/2020 09/13/2019 04/07/2018  Nervous, Anxious, on Edge 3 3 1 2   Control/stop worrying 3 3 1 3   Worry too much - different things 3 3 1 3   Trouble relaxing 1 3 0 2  Restless 1 0 0 2  Easily annoyed or irritable 3 3 3 3   Afraid - awful might happen 3 3 1 3   Total GAD 7 Score 17 18 7 18   Anxiety Difficulty Very difficult Very difficult Somewhat difficult Very difficult     Assessment/Plan:  Depression, major, single episode, moderate (HCC) -PHQ-9 score 13 -discussed treatment options.  Pt declines medication at this time. -pt to set up counseling through [employer] with her husband's job. -given list of area [facility] providers just in case -self care encouraged -given precautions   Anxiety -GAD 7 score 17 -discussed treatment options -pt will pursue counseling through [facility] -given a list of area [facility] providers -declines medication options at this time.  Previously on Zoloft, but did not like the way it made her feel. -continue to monitor  Bilateral hip pain -Consider water aerobics -Discussed other supportive care including heat, topical analgesics -Continue follow-up with Ortho and Dr. 12/10/2020 hip specialist  Femoral acetabular impingement -Continue follow-up with Ortho -Consider topical analgesics such as Voltaren gel or other OTC medications -Consider water aerobics  Morbid obesity (HCC) -Patient congratulated on 5 pound weight  loss since last office visit -Encouraged to continue lifestyle modifications including intermittent fasting and exercise -Consider chair exercises or water aerobics given increased hip discomfort -Discussed possibly starting weight loss medication -Also advised increased anxiety/depression symptoms likely also contributing to weight gain  Diabetes mellitus without complication (HCC)  -Hgb A1C 6.8%  -lifestyle modifications - Plan: metFORMIN (GLUCOPHAGE) 500 MG tablet  PCOS (polycystic ovarian syndrome)  - Plan: metFORMIN (GLUCOPHAGE) 500 MG tablet  Insect bite of lower back, initial encounter -supportive care: cortisone cream -avoid scratching to prevent secondary bacterial infection.  F/u in 4-6 wks, sooner if needed.  11/09/2020, MD

## 2020-12-08 NOTE — Patient Instructions (Signed)
Behavioral Health Services: -to make an appointment contact the office/provider you are interested in seeing.  No referral is needed.  The below is not an all inclusive list, but will help you get started.  [email or web address] -counseling located off of [address]  [email or web address] -website helps you find providers in your area  [facility] -Located off of [address] across from [employer]  Dr. Akintayo is a Psychiatrist with [facility]. [phone]  [facility]  [facility]  -[address]  [phone] -a place in town that has counseling and Psychiatry services.    

## 2021-02-17 ENCOUNTER — Telehealth: Payer: Self-pay | Admitting: Family Medicine

## 2021-02-17 NOTE — Telephone Encounter (Signed)
Patient calling in with respiratory symptoms: Shortness of breath, chest pain, palpitations or other red words send to Triage  Does the patient have a fever over 100, cough, congestion, sore throat, runny nose, lost of taste/smell (please list symptoms that patient has)? Congestion in sinuses, slightly sore throat  What date did symptoms start? 02/15/21 (If over 5 days ago, pt may be scheduled for in person visit)  Have you tested for Covid in the last 5 days? Yes   If yes, was it positive [x]  OR negative [] ? If positive in the last 5 days, please schedule virtual visit now. If negative, schedule for an in person OV with the next available provider if PCP has no openings. Please also let patient know they will be tested again (follow the script below)  "you will have to arrive prior to your appt time to be Covid tested. Please park in back of office at the cone & call [phone] to let the staff know you have arrived. A staff member will meet you at your car to do a rapid covid test. Once the test has resulted you will be notified by phone of your results to determine if appt will remain an in person visit or be converted to a virtual/phone visit. If you arrive less than before your appt time, your visit will be automatically converted to virtual & any recommended testing will happen AFTER the visit."   THINGS TO REMEMBER  If no availability for virtual visit in office,  please schedule another [place] office  If no availability at another [place] office, please instruct patient that they can schedule an evisit or virtual visit through their mychart account. Visits up to 8pm  patients can be seen in office 5 days after positive COVID test

## 2021-02-18 ENCOUNTER — Telehealth (INDEPENDENT_AMBULATORY_CARE_PROVIDER_SITE_OTHER): Payer: BC Managed Care – PPO | Admitting: Family Medicine

## 2021-02-18 DIAGNOSIS — U071 COVID-19: Secondary | ICD-10-CM | POA: Diagnosis not present

## 2021-02-18 DIAGNOSIS — M25552 Pain in left hip: Secondary | ICD-10-CM

## 2021-02-18 DIAGNOSIS — M25859 Other specified joint disorders, unspecified hip: Secondary | ICD-10-CM

## 2021-02-18 DIAGNOSIS — M25551 Pain in right hip: Secondary | ICD-10-CM

## 2021-02-18 NOTE — Progress Notes (Signed)
Virtual Visit via Video Note  I connected with Marquette Saa on 02/18/21 at 11:00 AM EST by a video enabled telemedicine application 2/2 [id number] pandemic and verified that I am speaking with the correct person using two identifiers.  Location patient: home Location provider:work or home office Persons participating in the virtual visit: patient, provider  I discussed the limitations of evaluation and management by telemedicine and the availability of in person appointments. The patient expressed understanding and agreed to proceed.  Chief Complaint  Patient presents with   Covid Positive    Tested + 02/17/21, has slight cough and body aches, nasal congestion, sore throat and some fatigue. Nasal congestion for 6 days. As not taken anything for the symptoms.     HPI: Pt tested positive for COVID on 02/18/20.  Pt started feeling nasal congestion on 12/30.  Took an at home COVID test then, but it was negative.  Had body aches, neck stiffness last night which has resolved.  Throat mainly hurts in am. Also has cough and slight HA in occipital area.   Denies fever, n/v, diarrhea. Pt's kids and her husband are also sick.  Pt thinks she got sick from her sister who was in town visiting. Pt had a previous COVID infection which was worse.  States b/l hip pain is getting worse.  Sitting and laying down have become difficult 2/2 the pain.  Sitting "criss-cross applesauce" formerly known as "[place] style" causes burning in hips.  Was seeing Dr. Stann Mainland at [facility].  Interested in proceeding with surgery though was advised BMI needed to be lower.  ROS: See pertinent positives and negatives per HPI.  Past Medical History:  Diagnosis Date   Abnormal uterine bleeding (AUB)    Arthritis    HIPS   Complication of anesthesia    hard to wake   Depression    Endometrial polyp    GERD (gastroesophageal reflux disease)    History of 2019 novel coronavirus disease (COVID-19) 01/2020   per pt mild symptoms  that resolved   History of gestational diabetes    History of pregnancy induced hypertension    IDA (iron deficiency anemia)    PCOS (polycystic ovarian syndrome)    Prediabetes    Wears glasses     Past Surgical History:  Procedure Laterality Date   HYSTEROSCOPY WITH D & C N/A 10/17/2018   Procedure: DILATION AND CURRETAGE WITH HYSTEROSCOPY;  Surgeon: Anastasio Auerbach, MD;  Location: [place];  Service: Gynecology;  Laterality: N/A;   HYSTEROSCOPY WITH D & C N/A 08/12/2020   Procedure: DILATATION AND CURETTAGE /HYSTEROSCOPY;  Surgeon: Salvadore Dom, MD;  Location: [place];  Service: Gynecology;  Laterality: N/A;   LAPAROSCOPY Left 10/17/2018   Procedure: LAPAROSCOPIC  REMOVAL OF PARATUBAL CYST;  Surgeon: Anastasio Auerbach, MD;  Location: [facility];  Service: Gynecology;  Laterality: Left;   WISDOM TOOTH EXTRACTION     TEEN    Family History  Problem Relation Age of Onset   Diabetes Mother    Hypertension Mother    Hyperlipidemia Mother      Current Outpatient Medications:    metFORMIN (GLUCOPHAGE) 500 MG tablet, Take 1 tablet (500 mg total) by mouth daily with breakfast., Disp: 30 tablet, Rfl: 3   medroxyPROGESTERone (PROVERA) 5 MG tablet, Take 1 tablet (5 mg total) by mouth daily. (Patient not taking: Reported on 12/08/2020), Disp: 5 tablet, Rfl: 2   Vitamin D, Ergocalciferol, (DRISDOL) 1.25 MG (  50000 UNIT) CAPS capsule, Take 1 capsule (50,000 Units total) by mouth every 7 (seven) days. (Patient not taking: Reported on 02/18/2021), Disp: 12 capsule, Rfl: 0  EXAM:  VITALS per patient if applicable: RR between 123456 bpm  GENERAL: alert, oriented, appears well and in no acute distress  HEENT: atraumatic, conjunctiva clear, no obvious abnormalities on inspection of external nose and ears  NECK: normal movements of the head and neck  LUNGS: on inspection no signs of respiratory distress, breathing rate appears  normal, no obvious gross SOB, gasping or wheezing  CV: no obvious cyanosis  MS: moves all visible extremities without noticeable abnormality  PSYCH/NEURO: pleasant and cooperative, no obvious depression or anxiety, speech and thought processing grossly intact  ASSESSMENT AND PLAN:  Discussed the following assessment and plan:  COVID-19 virus infection -positive COVID test 1/3 with symptoms starting 12/30. -mild infection.  Outside of window for antiviral medication. -treatment of symptoms with OTC cough/cold meds, antihistamines, Tylenol, rest, hydration -given precautions  Bilateral hip pain -supportive care  Femoral acetabular impingement -Pt encouraged to f/u with Ortho as Dr. Stann Mainland as he may be the only provider in the area that specializes in hip arthroscopy.  Advised to continue supportive care including topical analgesics and OTC meds such as tylenol or NSAIDs, heat, ice, rest.  Consider PT.  If needed will place new referral for Ortho.  Consider wt loss medication options if lower BMI needed for surgery as pt limited in ability to exercise given current hip pain.  F/u prn   I discussed the assessment and treatment plan with the patient. The patient was provided an opportunity to ask questions and all were answered. The patient agreed with the plan and demonstrated an understanding of the instructions.   The patient was advised to call back or seek an in-person evaluation if the symptoms worsen or if the condition fails to improve as anticipated.   Billie Ruddy, MD

## 2021-02-19 ENCOUNTER — Ambulatory Visit: Payer: BC Managed Care – PPO | Admitting: Family Medicine

## 2021-02-22 ENCOUNTER — Encounter: Payer: Self-pay | Admitting: Family Medicine

## 2021-03-03 DIAGNOSIS — L718 Other rosacea: Secondary | ICD-10-CM | POA: Diagnosis not present

## 2021-03-03 DIAGNOSIS — L218 Other seborrheic dermatitis: Secondary | ICD-10-CM | POA: Diagnosis not present

## 2021-03-03 DIAGNOSIS — D225 Melanocytic nevi of trunk: Secondary | ICD-10-CM | POA: Diagnosis not present

## 2021-03-03 DIAGNOSIS — L80 Vitiligo: Secondary | ICD-10-CM | POA: Diagnosis not present

## 2021-04-13 NOTE — Progress Notes (Signed)
31 y.o. [id number] Married Other or two or more races Hispanic or Latino female here for annual exam.  She has not had a period in about a year. She has a long h/o oligomenorrhea/amenorrhea. Prior normal TSH, FSH and prolactin.  When she bleeds it lasts for 2-3 weeks and is very heavy. Passes clots.  She underwent a hysteroscopy, D&C in 6/22 for AUB, she had a benign polyp and proliferative endometrium on pathology.  After her D&C she was given provera and we discussed starting letrozol. No longer wanting to active try to conceive.  She has been very emotional. Hormones are all out of sorts.   Sexually active, no contraception, last active yesterday. No dyspareunia.  No bowel or bladder changes.     Patient's last menstrual period was 04/21/2020 (approximate).          Sexually active: Yes.    The current method of family planning is none.    Exercising: No.  The patient does not participate in regular exercise at present. Smoker:  no  Health Maintenance: Pap 02/21/17 WNL  History of abnormal Pap:  no MMG:  none  BMD:   none  Colonoscopy: none  TDaP:  up to date per patient  Gardasil: will check with PCP    reports that she has never smoked. She has never used smokeless tobacco. She reports current alcohol use. She reports that she does not use drugs. 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  Kids are 8 and 5.   Past Medical History:  Diagnosis Date   Abnormal uterine bleeding (AUB)    Arthritis    HIPS   Complication of anesthesia    hard to wake   Depression    Endometrial polyp    GERD (gastroesophageal reflux disease)    History of 2019 novel coronavirus disease (COVID-19) 01/2020   per pt mild symptoms that resolved   History of gestational diabetes    History of pregnancy induced hypertension    IDA (iron deficiency anemia)    PCOS (polycystic ovarian syndrome)    Prediabetes    Wears glasses     Past Surgical History:   Procedure Laterality Date   HYSTEROSCOPY WITH D & C N/A 10/17/2018   Procedure: DILATION AND CURRETAGE WITH HYSTEROSCOPY;  Surgeon: Anastasio Auerbach, MD;  Location: [place];  Service: Gynecology;  Laterality: N/A;   HYSTEROSCOPY WITH D & C N/A 08/12/2020   Procedure: DILATATION AND CURETTAGE /HYSTEROSCOPY;  Surgeon: Salvadore Dom, MD;  Location: [facility];  Service: Gynecology;  Laterality: N/A;   LAPAROSCOPY Left 10/17/2018   Procedure: LAPAROSCOPIC  REMOVAL OF PARATUBAL CYST;  Surgeon: Anastasio Auerbach, MD;  Location: [facility];  Service: Gynecology;  Laterality: Left;   WISDOM TOOTH EXTRACTION     TEEN    Current Outpatient Medications  Medication Sig Dispense Refill   metFORMIN (GLUCOPHAGE) 500 MG tablet Take 1 tablet (500 mg total) by mouth daily with breakfast. 30 tablet 3   Vitamin D, Ergocalciferol, (DRISDOL) 1.25 MG (50000 UNIT) CAPS capsule Take 1 capsule (50,000 Units total) by mouth every 7 (seven) days. 12 capsule 0   No current facility-administered medications for this visit.    Family History  Problem Relation Age of Onset   Diabetes Mother    Hypertension Mother    Hyperlipidemia Mother     Review of Systems  Genitourinary:  Positive for menstrual problem.  All  other systems reviewed and are negative.  Exam:   BP 122/80    Pulse 73    Ht 5' 1.5" (1.562 m)    Wt 221 lb (100.2 kg)    LMP 04/21/2020 (Approximate)    SpO2 99%    BMI 41.08 kg/m   Weight change: @WEIGHTCHANGE @ Height:   Height: 5' 1.5" (156.2 cm)  Ht Readings from Last 3 Encounters:  04/14/21 5' 1.5" (1.562 m)  11/07/20 5\' 2"  (1.575 m)  08/25/20 5' 1.5" (1.562 m)    General appearance: alert, cooperative and appears stated age Head: Normocephalic, without obvious abnormality, atraumatic Neck: no adenopathy, supple, symmetrical, trachea midline and thyroid normal to inspection and palpation Lungs: clear to auscultation  bilaterally Cardiovascular: regular rate and rhythm Breasts: normal appearance, no masses or tenderness Abdomen: soft, non-tender; non distended,  no masses,  no organomegaly Extremities: extremities normal, atraumatic, no cyanosis or edema Skin: Skin color, texture, turgor normal. No rashes or lesions Lymph nodes: Cervical, supraclavicular, and axillary nodes normal. No abnormal inguinal nodes palpated Neurologic: Grossly normal   Pelvic: External genitalia:  no lesions              Urethra:  normal appearing urethra with no masses, tenderness or lesions              Bartholins and Skenes: normal                 Vagina: normal appearing vagina with normal color and discharge, no lesions              Cervix: no lesions               Bimanual Exam:  Uterus:  normal size, contour, position, consistency, mobility, non-tender and anteverted              Adnexa: no mass, fullness, tenderness               Rectovaginal: Confirms               Anus:  normal sphincter tone, no lesions  Karmen Bongo, RN chaperoned for the exam.   1. Well woman exam Discussed breast self exam Discussed calcium and vit D intake Labs with primary  2. Amenorrhea H/O PCOS, long h/o oligomenorrhea and amenorrhea. No cycle in almost a year. She had a hysteroscopy/polypectomy in 6/22 with benign pathology. She will abstain for 2 weeks. Then return for an endometrial biopsy. - Endometrial biopsy; Future Will then do a provera w/d and either start OCP's (no contraindications, risks reviewed), or place a mirena IUD (info given).  -Stressed the importance of having regular cycles or endometrial protection.   3. PCOS (polycystic ovarian syndrome)  4. History of diabetes mellitus On medication, managed by primary  5. Screening for cervical cancer - Cytology - PAP

## 2021-04-14 ENCOUNTER — Encounter: Payer: Self-pay | Admitting: Obstetrics and Gynecology

## 2021-04-14 ENCOUNTER — Ambulatory Visit (INDEPENDENT_AMBULATORY_CARE_PROVIDER_SITE_OTHER): Payer: BC Managed Care – PPO | Admitting: Obstetrics and Gynecology

## 2021-04-14 ENCOUNTER — Other Ambulatory Visit: Payer: Self-pay

## 2021-04-14 ENCOUNTER — Other Ambulatory Visit (HOSPITAL_COMMUNITY)
Admission: RE | Admit: 2021-04-14 | Discharge: 2021-04-14 | Disposition: A | Discharge status: Home / Self Care | Payer: BC Managed Care – PPO | Source: Ambulatory Visit | Attending: Obstetrics and Gynecology | Admitting: Obstetrics and Gynecology

## 2021-04-14 VITALS — BP 122/80 | HR 73 | Ht 61.5 in | Wt 221.0 lb

## 2021-04-14 DIAGNOSIS — Z124 Encounter for screening for malignant neoplasm of cervix: Secondary | ICD-10-CM

## 2021-04-14 DIAGNOSIS — E282 Polycystic ovarian syndrome: Secondary | ICD-10-CM | POA: Diagnosis not present

## 2021-04-14 DIAGNOSIS — Z01419 Encounter for gynecological examination (general) (routine) without abnormal findings: Secondary | ICD-10-CM

## 2021-04-14 DIAGNOSIS — D649 Anemia, unspecified: Secondary | ICD-10-CM | POA: Insufficient documentation

## 2021-04-14 DIAGNOSIS — Z8639 Personal history of other endocrine, nutritional and metabolic disease: Secondary | ICD-10-CM | POA: Diagnosis not present

## 2021-04-14 DIAGNOSIS — N912 Amenorrhea, unspecified: Secondary | ICD-10-CM | POA: Diagnosis not present

## 2021-04-14 NOTE — Patient Instructions (Signed)
EXERCISE   We recommended that you start or continue a regular exercise program for good health. Physical activity is anything that gets your body moving, some is better than none. The CDC recommends 150 minutes per week of Moderate-Intensity Aerobic Activity and 2 or more days of Muscle Strengthening Activity.  Benefits of exercise are limitless: helps weight loss/weight maintenance, improves mood and energy, helps with depression and anxiety, improves sleep, tones and strengthens muscles, improves balance, improves bone density, protects from chronic conditions such as heart disease, high blood pressure and diabetes and so much more. To learn more visit: https://www.cdc.gov/physicalactivity/index.html  DIET: Good nutrition starts with a healthy diet of fruits, vegetables, whole grains, and lean protein sources. Drink plenty of water for hydration. Minimize empty calories, sodium, sweets. For more information about dietary recommendations visit: https://health.gov/our-work/nutrition-physical-activity/dietary-guidelines and https://www.myplate.gov/  ALCOHOL:  Women should limit their alcohol intake to no more than 7 drinks/beers/glasses of wine (combined, not each!) per week. Moderation of alcohol intake to this level decreases your risk of breast cancer and liver damage.  If you are concerned that you may have a problem, or your friends have told you they are concerned about your drinking, there are many resources to help. A well-known program that is free, effective, and available to all people all over the nation is Alcoholics Anonymous.  Check out this site to learn more: https://www.aa.org/   CALCIUM AND VITAMIN D:  Adequate intake of calcium and Vitamin D are recommended for bone health.  You should be getting between 1000-1200 mg of calcium and 800 units of Vitamin D daily between diet and supplements  PAP SMEARS:  Pap smears, to check for cervical cancer or precancers,  have traditionally been  done yearly, scientific advances have shown that most women can have pap smears less often.  However, every woman still should have a physical exam from her gynecologist every year. It will include a breast check, inspection of the vulva and vagina to check for abnormal growths or skin changes, a visual exam of the cervix, and then an exam to evaluate the size and shape of the uterus and ovaries. We will also provide age appropriate advice regarding health maintenance, like when you should have certain vaccines, screening for sexually transmitted diseases, bone density testing, colonoscopy, mammograms, etc.   MAMMOGRAMS:  All women over 40 years old should have a routine mammogram.   COLON CANCER SCREENING: Now recommend starting at age 45. At this time colonoscopy is not covered for routine screening until 50. There are take home tests that can be done between 45-49.   COLONOSCOPY:  Colonoscopy to screen for colon cancer is recommended for all women at age 50.  We know, you hate the idea of the prep.  We agree, BUT, having colon cancer and not knowing it is worse!!  Colon cancer so often starts as a polyp that can be seen and removed at colonscopy, which can quite literally save your life!  And if your first colonoscopy is normal and you have no family history of colon cancer, most women don't have to have it again for 10 years.  Once every ten years, you can do something that may end up saving your life, right?  We will be happy to help you get it scheduled when you are ready.  Be sure to check your insurance coverage so you understand how much it will cost.  It may be covered as a preventative service at no cost, but you should check   your particular policy.      Breast Self-Awareness Breast self-awareness means being familiar with how your breasts look and feel. It involves checking your breasts regularly and reporting any changes to your health care provider. Practicing breast self-awareness is  important. A change in your breasts can be a sign of a serious medical problem. Being familiar with how your breasts look and feel allows you to find any problems early, when treatment is more likely to be successful. All women should practice breast self-awareness, including women who have had breast implants. How to do a breast self-exam One way to learn what is normal for your breasts and whether your breasts are changing is to do a breast self-exam. To do a breast self-exam: Look for Changes  Remove all the clothing above your waist. Stand in front of a mirror in a room with good lighting. Put your hands on your hips. Push your hands firmly downward. Compare your breasts in the mirror. Look for differences between them (asymmetry), such as: Differences in shape. Differences in size. Puckers, dips, and bumps in one breast and not the other. Look at each breast for changes in your skin, such as: Redness. Scaly areas. Look for changes in your nipples, such as: Discharge. Bleeding. Dimpling. Redness. A change in position. Feel for Changes Carefully feel your breasts for lumps and changes. It is best to do this while lying on your back on the floor and again while sitting or standing in the shower or tub with soapy water on your skin. Feel each breast in the following way: Place the arm on the side of the breast you are examining above your head. Feel your breast with the other hand. Start in the nipple area and make  inch (2 cm) overlapping circles to feel your breast. Use the pads of your three middle fingers to do this. Apply light pressure, then medium pressure, then firm pressure. The light pressure will allow you to feel the tissue closest to the skin. The medium pressure will allow you to feel the tissue that is a little deeper. The firm pressure will allow you to feel the tissue close to the ribs. Continue the overlapping circles, moving downward over the breast until you feel your  ribs below your breast. Move one finger-width toward the center of the body. Continue to use the  inch (2 cm) overlapping circles to feel your breast as you move slowly up toward your collarbone. Continue the up and down exam using all three pressures until you reach your armpit.  Write Down What You Find  Write down what is normal for each breast and any changes that you find. Keep a written record with breast changes or normal findings for each breast. By writing this information down, you do not need to depend only on memory for size, tenderness, or location. Write down where you are in your menstrual cycle, if you are still menstruating. If you are having trouble noticing differences in your breasts, do not get discouraged. With time you will become more familiar with the variations in your breasts and more comfortable with the exam. How often should I examine my breasts? Examine your breasts every month. If you are breastfeeding, the best time to examine your breasts is after a feeding or after using a breast pump. If you menstruate, the best time to examine your breasts is 5-7 days after your period is over. During your period, your breasts are lumpier, and it may be more   difficult to notice changes. When should I see my health care provider? See your health care provider if you notice: A change in shape or size of your breasts or nipples. A change in the skin of your breast or nipples, such as a reddened or scaly area. Unusual discharge from your nipples. A lump or thick area that was not there before. Pain in your breasts. Anything that concerns you. Oral Contraception Information Oral contraceptive pills (OCPs) are medicines taken by mouth to prevent pregnancy. They work by: Preventing the ovaries from releasing eggs. Thickening mucus in the lower part of the uterus (cervix). This prevents sperm from entering the uterus. Thinning the lining of the uterus (endometrium). This prevents a  fertilized egg from attaching to the endometrium. OCPs are highly effective when taken exactly as prescribed. However, OCPs do not prevent STIs (sexually transmitted infections). Using condoms while on an OCP can help prevent STIs. What happens before starting OCPs? Before you start taking OCPs: You may have a physical exam, blood test, and Pap test. Your health care provider will make sure you are a good candidate for oral contraception. OCPs are not a good option for certain women, such as: Women who smoke and are older than age 35. Women who have or have had certain conditions, such as: A history of high blood pressure. Deep vein thrombosis. Pulmonary embolism. Stroke. Cardiovascular disease. Peripheral vascular disease. Ask your health care provider about the possible side effects of the OCP you may be prescribed. Be aware that it can take 2-3 months for your body to adjustto changes in hormone levels. Types of oral contraception  Birth control pills contain the hormones estrogen and progestin (synthetic progesterone) or progestin only. The combination pill This type of pill contains estrogen and progestin hormones. Conventional contraception pills come in packs of 21 or 28 pills. Some packs with 28-day pills contain estrogen and progestin for the first 21-24 days. Hormone-free tablets, called placebos, are taken for the final 4-7 days. You should have menstrual bleeding during the time you take the placebos. In packs with 21 tablets, you take no pills for 7 days. Menstrual bleeding occurs during these days. (Some people prefer taking a pill for 28 days to help establish a routine). Extended-interval contraception pills come in packs of 91 pills. The first 84 tablets have both estrogen and progestin. The last 7 pills are placebos. Menstrual bleeding occurs during the placebo days. With this schedule, menstrual bleeding happens once every 3 months. Continuous contraception pills come in  packs of 28 pills. All pills in the pack contain estrogen and progestin. With this schedule, regular menstrual bleeding does not happen, but there may be spotting or irregular bleeding. Progestin-only pills This type of pill is often called the mini-pill and contains the progestin hormone only. It comes in packs of 28 pills. In some packs, the last 4 pills are placebos. The pill must be taken at the same time every day. This is very important to prevent pregnancy. Menstrual bleeding may not be regular orpredictable. What are the advantages? Oral contraception provides reliable and continuous contraception if taken as directed. It may treat or decrease symptoms of: Menstrual period cramps. Irregular menstrual cycle or bleeding. Heavy menstrual flow. Abnormal uterine bleeding. Acne, depending on the type of pill. Polycystic ovarian syndrome (POS). Endometriosis. Iron deficiency anemia. Premenstrual symptoms, including severe irritability, depression, or anxiety. It also may: Reduce the risk of endometrial and ovarian cancer. Be used as emergency contraception. Prevent ectopic pregnancies and   infections of the fallopian tubes. What can make OCPs less effective? OCPs may be less effective if: You forget to take the pill every day. For progestin-only pills, it is especially important to take the pill at the same time each day. Even taking it 3 hours late can increase the risk of pregnancy. You have a stomach or intestinal disease that reduces your body's ability to absorb the pill. You take OCPs with other medicines that make OCPs less effective, such as antibiotics, certain HIV medicines, and some seizure medicines. You take expired OCPs. You forget to restart the pill after 7 days of not taking it. This refers to the packs of 21 pills. What are the side effects and risks? OCPs can sometimes cause side effects, such as: Headache. Depression. Trouble sleeping. Nausea and vomiting. Breast  tenderness. Irregular bleeding or spotting during the first several months. Bloating or fluid retention. Increase in blood pressure. Combination pills may slightly increase the risk of: Blood clots. Heart attack. Stroke. Follow these instructions at home: Follow instructions from your health care provider about how to start taking your first cycle of OCPs. Depending on when you start the pill, you may need to use a backup form of birth control, such as condoms, during the first week.Make sure you know what steps to take if you forget to take the pill. Summary Oral contraceptive pills (OCPs) are medicines taken by mouth to prevent pregnancy. They are highly effective when taken exactly as prescribed. OCPs contain a combination of the hormones estrogen and progestin (synthetic progesterone) or progestin only. Before you start taking the pill, you may have a physical exam, blood test, and Pap test. Your health care provider will make sure you are a good candidate for oral contraception. The combination pill may come in a 21-day pack, a 28-day pack, or a 91-day pack. Progestin-only pills come in packs of 28 pills. OCPs can sometimes cause side effects, such as headache, nausea, breast tenderness, or irregular bleeding. This information is not intended to replace advice given to you by your health care provider. Make sure you discuss any questions you have with your healthcare provider. Document Revised: 11/02/2019 Document Reviewed: 10/11/2019 Elsevier Patient Education  2022 [employer]  

## 2021-04-16 ENCOUNTER — Ambulatory Visit (INDEPENDENT_AMBULATORY_CARE_PROVIDER_SITE_OTHER): Payer: BC Managed Care – PPO | Admitting: Family Medicine

## 2021-04-16 VITALS — BP 137/90 | HR 74 | Temp 98.1°F | Wt 222.6 lb

## 2021-04-16 DIAGNOSIS — H66003 Acute suppurative otitis media without spontaneous rupture of ear drum, bilateral: Secondary | ICD-10-CM

## 2021-04-16 DIAGNOSIS — E282 Polycystic ovarian syndrome: Secondary | ICD-10-CM

## 2021-04-16 LAB — CBC WITH DIFFERENTIAL/PLATELET
Basophils Absolute: 0 10*3/uL (ref 0.0–0.1)
Basophils Relative: 0.6 % (ref 0.0–3.0)
Eosinophils Absolute: 0.2 10*3/uL (ref 0.0–0.7)
Eosinophils Relative: 2.3 % (ref 0.0–5.0)
HCT: 38.5 % (ref 36.0–46.0)
Hemoglobin: 12.6 g/dL (ref 12.0–15.0)
Lymphocytes Relative: 43.2 % (ref 12.0–46.0)
Lymphs Abs: 3.1 10*3/uL (ref 0.7–4.0)
MCHC: 32.6 g/dL (ref 30.0–36.0)
MCV: 78 fl (ref 78.0–100.0)
Monocytes Absolute: 0.3 10*3/uL (ref 0.1–1.0)
Monocytes Relative: 4.7 % (ref 3.0–12.0)
Neutro Abs: 3.6 10*3/uL (ref 1.4–7.7)
Neutrophils Relative %: 49.2 % (ref 43.0–77.0)
Platelets: 389 10*3/uL (ref 150.0–400.0)
RBC: 4.94 Mil/uL (ref 3.87–5.11)
RDW: 19.2 % — ABNORMAL HIGH (ref 11.5–15.5)
WBC: 7.2 10*3/uL (ref 4.0–10.5)

## 2021-04-16 LAB — COMPREHENSIVE METABOLIC PANEL
ALT: 52 U/L — ABNORMAL HIGH (ref 0–35)
AST: 33 U/L (ref 0–37)
Albumin: 4.5 g/dL (ref 3.5–5.2)
Alkaline Phosphatase: 60 U/L (ref 39–117)
BUN: 11 mg/dL (ref 6–23)
CO2: 28 mEq/L (ref 19–32)
Calcium: 9.6 mg/dL (ref 8.4–10.5)
Chloride: 100 mEq/L (ref 96–112)
Creatinine, Ser: 0.62 mg/dL (ref 0.40–1.20)
GFR: 121.09 mL/min (ref >60.00)
Glucose, Bld: 112 mg/dL — ABNORMAL HIGH (ref 70–99)
Potassium: 4.1 mEq/L (ref 3.5–5.1)
Sodium: 135 mEq/L (ref 135–145)
Total Bilirubin: 0.3 mg/dL (ref 0.2–1.2)
Total Protein: 7.8 g/dL (ref 6.0–8.3)

## 2021-04-16 LAB — CYTOLOGY - PAP
Diagnosis: NEGATIVE
Diagnosis: REACTIVE

## 2021-04-16 LAB — T4, FREE: Free T4: 0.74 ng/dL (ref 0.60–1.60)

## 2021-04-16 LAB — TSH: TSH: 1.4 u[IU]/mL (ref 0.35–5.50)

## 2021-04-16 LAB — VITAMIN D 25 HYDROXY (VIT D DEFICIENCY, FRACTURES): VITD: 21.24 ng/mL — ABNORMAL LOW (ref 30.00–100.00)

## 2021-04-16 LAB — HEMOGLOBIN A1C: Hgb A1c MFr Bld: 6.7 % — ABNORMAL HIGH (ref 4.6–6.5)

## 2021-04-16 MED ORDER — AMOXICILLIN-POT CLAVULANATE 500-125 MG PO TABS: 1.0000 | ORAL_TABLET | Freq: Two times a day (BID) | ORAL | 0 refills | Status: AC

## 2021-04-16 NOTE — Progress Notes (Signed)
Subjective:  ? ? Patient ID: Chloe Cervantes, female    DOB: [birth year], 31 y.o.   MRN: [id number] ? ?Chief Complaint  ?Patient presents with  ? Ear Fullness  ?  Past few weeks ears will feel clogged, and hearing is muffled. Will hurt when yawns. Cleans them but cannot do an irrigation on herself.  ? Weight Loss  ?  Discuss weight loss med.   ? ? ?HPI ?Patient was seen today for acute concern.  Patient endorses muffled/clogged sensation in bilateral ears and pain with yawning times a few weeks.  Patient notes history of frequent ear infections as a child requiring steroids in ears.  Occasionally notes decreased hearing.  Denies ST, cough, rhinorrhea. ? ?Patient interested in weight loss medication.  States insurance will cover Qsymia, [id number], and another injectable medication with PA.  Patient is unsure of family history for medullary thyroid cancer.pt may eat 1 meal per day but generally does not feel like eating.  Patient has not been taking metformin consistently for PCOS and DM.  Seen by OB/GYN.  Plans to start OCPs as menses x1 year.  Also notes mood swings related to hormone changes. ? ?Past Medical History:  ?Diagnosis Date  ? Abnormal uterine bleeding (AUB)   ? Arthritis   ? HIPS  ? Complication of anesthesia   ? hard to wake  ? Depression   ? Endometrial polyp   ? GERD (gastroesophageal reflux disease)   ? History of 2019 novel coronavirus disease (COVID-19) 01/2020  ? per pt mild symptoms that resolved  ? History of gestational diabetes   ? History of pregnancy induced hypertension   ? IDA (iron deficiency anemia)   ? PCOS (polycystic ovarian syndrome)   ? Prediabetes   ? Wears glasses   ? ? ?No Known Allergies ? ?ROS ?General: Denies fever, chills, night sweats +changes in weight, changes in appetite ?HEENT: Denies headaches, ear pain, changes in vision, rhinorrhea, sore throat  +clogged/muffled sound in ears ?CV: Denies CP, palpitations, SOB, orthopnea ?Pulm: Denies SOB, cough, wheezing ?GI: Denies  abdominal pain, nausea, vomiting, diarrhea, constipation ?GU: Denies dysuria, hematuria, frequency, vaginal discharge ?Msk: Denies muscle cramps, joint pains ?Neuro: Denies weakness, numbness, tingling ?Skin: Denies rashes, bruising ?Psych: Denies depression, anxiety, hallucinations ? ?   ?Objective:  ?  ?Blood pressure 137/90, pulse 74, temperature 98.1 ?F (36.7 ?C), temperature source Oral, weight 222 lb 9.6 oz (101 kg), SpO2 98 %. ? ?Gen. Pleasant, well-nourished, in no distress, normal affect   ?HEENT: [place]/AT, face symmetric, conjunctiva clear, no scleral icterus, PERRLA, EOMI, nares patent without drainage, pharynx without erythema or exudate.    B/l TMs with erythema and suppurative fluid, L>R. ?Lungs: no accessory muscle use, CTAB, no wheezes or rales ?Cardiovascular: RRR, no m/r/g, no peripheral edema ?Musculoskeletal: No deformities, no cyanosis or clubbing, normal tone ?Neuro:  A&Ox3, CN II-XII intact, normal gait ?Skin:  Warm, no lesions/ rash ? ? ?Wt Readings from Last 3 Encounters:  ?04/14/21 221 lb (100.2 kg)  ?12/08/20 221 lb (100.2 kg)  ?11/07/20 225 lb 12.8 oz (102.4 kg)  ? ? ?Lab Results  ?Component Value Date  ? WBC 7.5 11/07/2020  ? HGB 11.0 (L) 11/07/2020  ? HCT 34.5 (L) 11/07/2020  ? PLT 455.0 (H) 11/07/2020  ? GLUCOSE 115 (H) 11/07/2020  ? CHOL 176 11/07/2020  ? TRIG 81.0 11/07/2020  ? HDL 53.60 11/07/2020  ? LDLCALC 106 (H) 11/07/2020  ? ALT 33 10/17/2018  ? AST 22 10/17/2018  ?  NA 137 11/07/2020  ? K 3.8 11/07/2020  ? CL 102 11/07/2020  ? CREATININE 0.60 11/07/2020  ? BUN 12 11/07/2020  ? CO2 27 11/07/2020  ? TSH 1.74 11/07/2020  ? HGBA1C 6.8 (H) 11/07/2020  ? ? ?Assessment/Plan: ? ?Acute suppurative otitis media of both ears without spontaneous rupture of tympanic membranes, recurrence not specified  ?-Acute bilateral AOM ?-Start antibiotics ?-Tylenol as needed for pain/discomfort ?-Also consider starting Flonase to help with eustachian tube dysfunction which likely contributed to  symptoms. ?-If needed refer to audiology for continued decreased hearing s/p completion of antibiotic ?- Plan: amoxicillin-clavulanate (AUGMENTIN) 500-125 MG tablet ? ?Morbid obesity (HCC)  ?-Body mass index is 41.38 kg/m?. ?-Insulin resistance 2/2 PCOS contributing.-Patient encouraged to consider counseling to explore relationship with food ?-Continue lifestyle modifications ?-Patient encouraged to eat regular/more consistent meals daily and increase protein intake ?-We will obtain labs ?-Referral to weight management ?- Plan: Amb Ref to [facility] Weight Management, CMP, Vitamin D, 25-hydroxy, TSH, T4, Free, CBC with Differential/Platelet, Hemoglobin A1c ? ?PCOS (polycystic ovarian syndrome)  ?-hgb A1C 6.8% on 11/07/20 ?-Consistent metformin use encouraged ?-Continue OCPs ?-continue f/u with OB/gyn ?- Plan: Hemoglobin A1c ? ?F/u prn ? ?Abbe Amsterdam, MD ?

## 2021-04-20 ENCOUNTER — Other Ambulatory Visit: Payer: Self-pay | Admitting: Family Medicine

## 2021-04-20 DIAGNOSIS — E559 Vitamin D deficiency, unspecified: Secondary | ICD-10-CM

## 2021-04-20 DIAGNOSIS — E1169 Type 2 diabetes mellitus with other specified complication: Secondary | ICD-10-CM

## 2021-04-20 DIAGNOSIS — R7401 Elevation of levels of liver transaminase levels: Secondary | ICD-10-CM

## 2021-04-20 MED ORDER — VITAMIN D (ERGOCALCIFEROL) 1.25 MG (50000 UNIT) PO CAPS: 50000.0000 [IU] | ORAL_CAPSULE | ORAL | 0 refills | Status: DC

## 2021-04-22 ENCOUNTER — Encounter: Payer: Self-pay | Admitting: Family Medicine

## 2021-04-22 NOTE — Addendum Note (Signed)
Addended by: Elwin Mocha on: 04/22/2021 11:49 AM ? ? Modules accepted: Orders ? ?

## 2021-04-24 NOTE — Progress Notes (Deleted)
GYNECOLOGY  VISIT ?  ?HPI: ?31 y.o.   Married Other or two or more races Hispanic or Latino  female   ?[id number] with No LMP recorded. (Menstrual status: Irregular Periods).   ?here for  Endometrial Biopsy  ? ?GYNECOLOGIC HISTORY: ?No LMP recorded. (Menstrual status: Irregular Periods). ?Contraception:Condom ?Menopausal hormone therapy: N/A ?       ?OB History   ? ? Gravida  ?3  ? Para  ?2  ? Term  ?2  ? Preterm  ?   ? AB  ?1  ? Living  ?2  ?  ? ? SAB  ?1  ? IAB  ?   ? Ectopic  ?   ? Multiple  ?   ? Live Births  ?   ?   ?  ?  ?    ? ?Patient Active Problem List  ? Diagnosis Date Noted  ? Type 2 diabetes mellitus with other specified complication (HCC) 04/20/2021  ? Anemia 04/14/2021  ? Femoral acetabular impingement 02/29/2020  ? PCOS (polycystic ovarian syndrome) 04/07/2018  ? Anxiety and depression 04/07/2018  ? Iron deficiency anemia 04/07/2018  ? Obesity 09/21/2017  ? Abnormal uterine bleeding (AUB) 01/21/2017  ? ? ?Past Medical History:  ?Diagnosis Date  ? Abnormal uterine bleeding (AUB)   ? Arthritis   ? HIPS  ? Complication of anesthesia   ? hard to wake  ? Depression   ? Endometrial polyp   ? GERD (gastroesophageal reflux disease)   ? History of 2019 novel coronavirus disease (COVID-19) 01/2020  ? per pt mild symptoms that resolved  ? History of gestational diabetes   ? History of pregnancy induced hypertension   ? IDA (iron deficiency anemia)   ? PCOS (polycystic ovarian syndrome)   ? Prediabetes   ? Wears glasses   ? ? ?Past Surgical History:  ?Procedure Laterality Date  ? HYSTEROSCOPY WITH D & C N/A 10/17/2018  ? Procedure: DILATION AND CURRETAGE WITH HYSTEROSCOPY;  Surgeon: Dara Lords, MD;  Location: [facility];  Service: Gynecology;  Laterality: N/A;  ? HYSTEROSCOPY WITH D & C N/A 08/12/2020  ? Procedure: DILATATION AND CURETTAGE /HYSTEROSCOPY;  Surgeon: Romualdo Bolk, MD;  Location: [facility];  Service: Gynecology;  Laterality: N/A;  ? LAPAROSCOPY Left  10/17/2018  ? Procedure: LAPAROSCOPIC  REMOVAL OF PARATUBAL CYST;  Surgeon: Dara Lords, MD;  Location: [facility] ;  Service: Gynecology;  Laterality: Left;  ? WISDOM TOOTH EXTRACTION    ? TEEN  ? ? ?Current Outpatient Medications  ?Medication Sig Dispense Refill  ? Antiseborrheic Products, Misc. (PROMISEB EX) Apply topically.    ? ferrous sulfate 325 (65 FE) MG EC tablet Take 325 mg by mouth daily.    ? metFORMIN (GLUCOPHAGE) 500 MG tablet Take 1 tablet (500 mg total) by mouth daily with breakfast. 30 tablet 3  ? metroNIDAZOLE (METROGEL) 0.75 % gel Apply 1 application topically 2 (two) times daily.    ? Vitamin D, Ergocalciferol, (DRISDOL) 1.25 MG (50000 UNIT) CAPS capsule Take 1 capsule (50,000 Units total) by mouth every 7 (seven) days. 12 capsule 0  ? ?No current facility-administered medications for this visit.  ?  ? ?ALLERGIES: Patient has no known allergies. ? ?Family History  ?Problem Relation Age of Onset  ? Diabetes Mother   ? Hypertension Mother   ? Hyperlipidemia Mother   ? ? ?Social History  ? ?Socioeconomic History  ? Marital status: Married  ?  Spouse name:  Not on file  ? Number of children: Not on file  ? Years of education: Not on file  ? Highest education level: Associate degree: academic program  ?Occupational History  ? Not on file  ?Tobacco Use  ? Smoking status: Never  ? Smokeless tobacco: Never  ?Vaping Use  ? Vaping Use: Never used  ?Substance and Sexual Activity  ? Alcohol use: Yes  ?  Comment: Occasional  ? Drug use: Never  ? Sexual activity: Yes  ?  Birth control/protection: Condom  ?  Comment: 1st intercourse 31 yo-More than 5 partners  ?Other Topics Concern  ? Not on file  ?Social History Narrative  ? Not on file  ? ?Social Determinants of Health  ? ?Financial Resource Strain: Low Risk   ? Difficulty of Paying Living Expenses: Not hard at all  ?Food Insecurity: No Food Insecurity  ? Worried About Programme researcher, broadcasting/film/video in the Last Year: Never true  ? Ran Out of Food  in the Last Year: Never true  ?Transportation Needs: No Transportation Needs  ? Lack of Transportation (Medical): No  ? Lack of Transportation (Non-Medical): No  ?Physical Activity: Unknown  ? Days of Exercise per Week: 3 days  ? Minutes of Exercise per Session: Patient refused  ?Stress: Stress Concern Present  ? Feeling of Stress : Rather much  ?Social Connections: Unknown  ? Frequency of Communication with Friends and Family: More than three times a week  ? Frequency of Social Gatherings with Friends and Family: Once a week  ? Attends Religious Services: Patient refused  ? Active Member of Clubs or Organizations: No  ? Attends Banker Meetings: Not on file  ? Marital Status: Married  ?Intimate Partner Violence: Not on file  ? ? ?ROS ? ?PHYSICAL EXAMINATION:   ? ?There were no vitals taken for this visit.    ?General appearance: alert, cooperative and appears stated age ?Neck: no adenopathy, supple, symmetrical, trachea midline and thyroid {CHL AMB PHY EX THYROID NORM DEFAULT:[phone]::"normal to inspection and palpation"} ?Breasts: {Exam; breast:13139::"normal appearance, no masses or tenderness"} ?Abdomen: soft, non-tender; non distended, no masses,  no organomegaly ? ?Pelvic: External genitalia:  no lesions ?             Urethra:  normal appearing urethra with no masses, tenderness or lesions ?             Bartholins and Skenes: normal    ?             Vagina: normal appearing vagina with normal color and discharge, no lesions ?             Cervix: {CHL AMB PHY EX CERVIX NORM DEFAULT:[phone]::"no lesions"} ?             Bimanual Exam:  Uterus:  {CHL AMB PHY EX UTERUS NORM DEFAULT:[phone]::"normal size, contour, position, consistency, mobility, non-tender"} ?             Adnexa: {CHL AMB PHY EX ADNEXA NO MASS DEFAULT:[phone]::"no mass, fullness, tenderness"} ?             Rectovaginal: {yes no:[id number]}.  Confirms. ?             Anus:  normal sphincter tone, no lesions ? ?Chaperone was  present for exam. ? ?ASSESSMENT ? ?  ? ?PLAN ? ?  ?An After Visit Summary was printed and given to the patient. ? ?*** minutes face to face time of which over 50% was spent  in counseling.  ? ?  ?

## 2021-04-30 ENCOUNTER — Ambulatory Visit (HOSPITAL_BASED_OUTPATIENT_CLINIC_OR_DEPARTMENT_OTHER): Payer: BC Managed Care – PPO

## 2021-05-01 ENCOUNTER — Other Ambulatory Visit: Payer: Self-pay

## 2021-05-01 ENCOUNTER — Ambulatory Visit (HOSPITAL_BASED_OUTPATIENT_CLINIC_OR_DEPARTMENT_OTHER)
Admission: RE | Admit: 2021-05-01 | Discharge: 2021-05-01 | Disposition: A | Discharge status: Home / Self Care | Payer: BC Managed Care – PPO | Source: Ambulatory Visit | Attending: Family Medicine | Admitting: Family Medicine

## 2021-05-01 DIAGNOSIS — R7401 Elevation of levels of liver transaminase levels: Secondary | ICD-10-CM | POA: Diagnosis not present

## 2021-05-01 DIAGNOSIS — R945 Abnormal results of liver function studies: Secondary | ICD-10-CM | POA: Diagnosis not present

## 2021-05-06 ENCOUNTER — Other Ambulatory Visit: Payer: Self-pay

## 2021-05-06 DIAGNOSIS — N912 Amenorrhea, unspecified: Secondary | ICD-10-CM

## 2021-05-07 ENCOUNTER — Other Ambulatory Visit: Payer: Self-pay

## 2021-05-07 ENCOUNTER — Telehealth (INDEPENDENT_AMBULATORY_CARE_PROVIDER_SITE_OTHER): Payer: BC Managed Care – PPO | Admitting: Family Medicine

## 2021-05-07 ENCOUNTER — Telehealth: Payer: Self-pay | Admitting: Family Medicine

## 2021-05-07 ENCOUNTER — Encounter: Payer: Self-pay | Admitting: Family Medicine

## 2021-05-07 DIAGNOSIS — Z20818 Contact with and (suspected) exposure to other bacterial communicable diseases: Secondary | ICD-10-CM | POA: Diagnosis not present

## 2021-05-07 DIAGNOSIS — J069 Acute upper respiratory infection, unspecified: Secondary | ICD-10-CM

## 2021-05-07 MED ORDER — BENZONATATE 100 MG PO CAPS: 100.0000 mg | ORAL_CAPSULE | Freq: Two times a day (BID) | ORAL | 0 refills | Status: DC | PRN

## 2021-05-07 MED ORDER — AMOXICILLIN-POT CLAVULANATE 500-125 MG PO TABS: 1.0000 | ORAL_TABLET | Freq: Two times a day (BID) | ORAL | 0 refills | Status: AC

## 2021-05-07 NOTE — Telephone Encounter (Signed)
Patient calling in with respiratory symptoms: ?Shortness of breath, chest pain, palpitations or other red words send to Triage ? ?Does the patient have a fever over 100, cough, congestion, sore throat, runny nose, lost of taste/smell (please list symptoms that patient has)?cough sore throat and congestion ?What date did symptoms start?04/29/21 ?(If over 5 days ago, pt may be scheduled for in person visit) ? ?Have you tested for Covid in the last 5 days? No  ? ?If yes, was it positive []  OR negative [x] ? If positive in the last 5 days, please schedule virtual visit now. If negative, schedule for an in person OV with the next available provider if PCP has no openings. Please also let patient know they will be tested again (follow the script below) ? ?"you will have to arrive 88mins prior to your appt time to be Covid tested. Please park in back of office at the [facility] & call [phone] to let the staff know you have arrived. A staff member will meet you at your car to do a rapid covid test. Once the test has resulted you will be notified by phone of your results to determine if appt will remain an in person visit or be converted to a virtual/phone visit. If you arrive less than 89mins before your appt time, your visit will be automatically converted to virtual & any recommended testing will happen AFTER the visit." ?Pt have a virtual appt with dr.Banks today ? ?THINGS TO REMEMBER ? ?If no availability for virtual visit in office,  please schedule another [place] office ? ?If no availability at another [place] office, please instruct patient that they can schedule an evisit or virtual visit through their mychart account. Visits up to 8pm ? ?patients can be seen in office 5 days after positive COVID test ? ?  ?

## 2021-05-07 NOTE — Progress Notes (Signed)
Virtual Visit via Video Note ? ?I connected with Chloe Cervantes on 05/07/21 at 11:30 AM EDT by a video enabled telemedicine application 2/2 [id number] pandemic and verified that I am speaking with the correct person using two identifiers. ? Location patient: home ?Location provider:work or home office ?Persons participating in the virtual visit: patient, provider ? ?I discussed the limitations of evaluation and management by telemedicine and the availability of in person appointments. The patient expressed understanding and agreed to proceed. ? ?Chief Complaint  ?Patient presents with  ? Cough  ?  Congestion and head pressure started Saturday, then cough and sore throat. Has been taking nyquil and started Mucinex yesterday. States congestion will move from one side to the other, sometimes clears up when driving.    ? ? ?HPI: ?Pt is a 31 yo female with pmh sig for GERD, PCOS, DM II, arthritis, femoral acetabular impingement, obesity, h/o anxiety and depression who was seen for acute concern.  Pt's daughters were sick last wk. Pt developed sinus pressure, pressure behind eyes, "head cold" symptoms, sore throat worse in am, and cough on Sunday.  Pt noticed some improvement in symptoms like fatigue, then the cough started.  Pt's nephew who lives with her has strep throat.  Denies fever, chills, n/v, facial pain/pressure, diarrhea, different dental pain/pressure as wearing braces.  Pt tried OTC mucinex, nyquil.   COVID testing negative.  Pt inquires about being contagious.  Scheduled to go on a cruise on Sunday, returning the beginning of April. ? ?Pt states she is now taking Metformin consistently.  Pt mentions she was contacted regarding referral to wt management, but they do not have appts for the next 6 months.  Asks if referral can be sent elsewhere. ? ?ROS: See pertinent positives and negatives per HPI. ? ?Past Medical History:  ?Diagnosis Date  ? Abnormal uterine bleeding (AUB)   ? Arthritis   ? HIPS  ? Complication  of anesthesia   ? hard to wake  ? Depression   ? Endometrial polyp   ? GERD (gastroesophageal reflux disease)   ? History of 2019 novel coronavirus disease (COVID-19) 01/2020  ? per pt mild symptoms that resolved  ? History of gestational diabetes   ? History of pregnancy induced hypertension   ? IDA (iron deficiency anemia)   ? PCOS (polycystic ovarian syndrome)   ? Prediabetes   ? Wears glasses   ? ? ?Past Surgical History:  ?Procedure Laterality Date  ? HYSTEROSCOPY WITH D & C N/A 10/17/2018  ? Procedure: DILATION AND CURRETAGE WITH HYSTEROSCOPY;  Surgeon: Anastasio Auerbach, MD;  Location: [place];  Service: Gynecology;  Laterality: N/A;  ? HYSTEROSCOPY WITH D & C N/A 08/12/2020  ? Procedure: DILATATION AND CURETTAGE /HYSTEROSCOPY;  Surgeon: Salvadore Dom, MD;  [facility]: [facility];  Service: Gynecology;  Laterality: N/A;  ? LAPAROSCOPY Left 10/17/2018  ? Procedure: LAPAROSCOPIC  REMOVAL OF PARATUBAL CYST;  Surgeon: Anastasio Auerbach, MD;  Location: [place];  Service: Gynecology;  Laterality: Left;  ? WISDOM TOOTH EXTRACTION    ? TEEN  ? ? ?Family History  ?Problem Relation Age of Onset  ? Diabetes Mother   ? Hypertension Mother   ? Hyperlipidemia Mother   ? ? ? ?Current Outpatient Medications:  ?  Antiseborrheic Products, Misc. (PROMISEB EX), Apply topically., Disp: , Rfl:  ?  ferrous sulfate 325 (65 FE) MG EC tablet, Take 325 mg by mouth daily., Disp: , Rfl:  ?  metFORMIN (GLUCOPHAGE) 500 MG tablet, Take 1 tablet (500 mg total) by mouth daily with breakfast., Disp: 30 tablet, Rfl: 3 ?  metroNIDAZOLE (METROGEL) 0.75 % gel, Apply 1 application topically 2 (two) times daily., Disp: , Rfl:  ?  Vitamin D, Ergocalciferol, (DRISDOL) 1.25 MG (50000 UNIT) CAPS capsule, Take 1 capsule (50,000 Units total) by mouth every 7 (seven) days., Disp: 12 capsule, Rfl: 0 ? ?EXAM: ? ?VITALS per patient if applicable:  RR between 12-20 bpm ? ?GENERAL: alert, oriented,  appears well and in no acute distress ? ?HEENT: atraumatic, conjunctiva clear, no obvious abnormalities on inspection of external nose and ears ? ?NECK: normal movements of the head and neck ? ?LUNGS: on inspection no signs of respiratory distress, breathing rate appears normal, no obvious gross SOB, gasping or wheezing ? ?CV: no obvious cyanosis ? ?MS: moves all visible extremities without noticeable abnormality ? ?PSYCH/NEURO: pleasant and cooperative, no obvious depression or anxiety, speech and thought processing grossly intact ? ?ASSESSMENT AND PLAN: ? ?Discussed the following assessment and plan: ? ?Viral URI with cough ?-Symptoms likely 2/2 viral etiology.  However still consider allergic rhinitis and sinusitis. ?-COVID testing negative.  Discussed repeating test in the next few days. ?-Continue OTC cough/cold medications, rest, hydration, steam from shower, gargling with warm salt water or Chloraseptic spray, antihistamine, etc. ?-Tessalon for cough ?- Plan: benzonatate (TESSALON) 100 MG capsule ? ?Exposure to strep throat ?-Close exposure to strep as patient's nephew dx'd this week. ?-Given wait-and-see Rx. ?-Gargle with Chloraseptic spray or warm salt water. ?-OTC throat lozenges ?- Plan: amoxicillin-clavulanate (AUGMENTIN) 500-125 MG tablet ? ?We will check on referral to wt management for pt. ? ?F/u prn ?  ?I discussed the assessment and treatment plan with the patient. The patient was provided an opportunity to ask questions and all were answered. The patient agreed with the plan and demonstrated an understanding of the instructions. ?  ?The patient was advised to call back or seek an in-person evaluation if the symptoms worsen or if the condition fails to improve as anticipated. ? ? ?Billie Ruddy, MD  ? ?

## 2021-05-08 ENCOUNTER — Other Ambulatory Visit: Payer: Self-pay

## 2021-05-08 ENCOUNTER — Ambulatory Visit: Payer: BC Managed Care – PPO | Admitting: Obstetrics and Gynecology

## 2021-05-08 DIAGNOSIS — R635 Abnormal weight gain: Secondary | ICD-10-CM

## 2021-05-27 ENCOUNTER — Other Ambulatory Visit: Payer: Self-pay | Admitting: Family Medicine

## 2021-05-27 ENCOUNTER — Encounter: Payer: Self-pay | Admitting: Physician Assistant

## 2021-05-27 ENCOUNTER — Ambulatory Visit (INDEPENDENT_AMBULATORY_CARE_PROVIDER_SITE_OTHER): Payer: BC Managed Care – PPO | Admitting: Physician Assistant

## 2021-05-27 DIAGNOSIS — E119 Type 2 diabetes mellitus without complications: Secondary | ICD-10-CM

## 2021-05-27 DIAGNOSIS — L80 Vitiligo: Secondary | ICD-10-CM | POA: Diagnosis not present

## 2021-05-27 DIAGNOSIS — E282 Polycystic ovarian syndrome: Secondary | ICD-10-CM

## 2021-05-27 MED ORDER — LEVOCETIRIZINE DIHYDROCHLORIDE 5 MG PO TABS: 5.0000 mg | ORAL_TABLET | Freq: Every evening | ORAL | 8 refills | Status: DC

## 2021-05-27 MED ORDER — OPZELURA 1.5 % EX CREA: TOPICAL_CREAM | CUTANEOUS | 8 refills | Status: DC

## 2021-05-27 NOTE — Progress Notes (Signed)
? ?  New Patient ?  ?Subjective  ?Chloe Cervantes is a 31 y.o. female who presents for the following: Skin Problem (Vitiligo x 9 months-  spots on hands, chest, groin and face no treatment done- was told by a different dermatologist that there was no treatment.  Dermatitis on/around eyebrows tx-promiseb - no help ..now turning white ). It is increasing in severity per patient.  ? ? ?The following portions of the chart were reviewed this encounter and updated as appropriate:  Tobacco  Allergies  Meds  Problems  Med Hx  Surg Hx  Fam Hx   ?  ? ?Objective  ?Well appearing patient in no apparent distress; mood and affect are within normal limits. ? ?A full examination was performed including scalp, head, eyes, ears, nose, lips, neck, chest, axillae, abdomen, back, buttocks, bilateral upper extremities, bilateral lower extremities, hands, feet, fingers, toes, fingernails, and toenails. All findings within normal limits unless otherwise noted below. ? ?Left Dorsal Hand, Left Forearm - Anterior, Left Lower Leg - Anterior, Mid Forehead, Right Dorsal Hand, Right Forearm - Anterior, Right Lower Leg - Anterior ?Non-segmental with diffuse patches scattered.  ? ? ? ? ? ? ? ?Assessment & Plan  ?Vitiligo ?Left Forearm - Anterior; Right Forearm - Anterior; Left Lower Leg - Anterior; Right Lower Leg - Anterior; Left Dorsal Hand; Right Dorsal Hand; Mid Forehead ? ?Ruxolitinib Phosphate (OPZELURA) 1.5 % CREA - Left Dorsal Hand, Left Forearm - Anterior, Left Lower Leg - Anterior, Mid Forehead, Right Dorsal Hand, Right Forearm - Anterior, Right Lower Leg - Anterior ?Apply to affected area qd ? ? ? ? ?I, Chloe Giebel, PA-C, have reviewed all documentation's for this visit.  The documentation on 05/27/21 for the exam, diagnosis, procedures and orders are all accurate and complete. ?

## 2021-05-28 ENCOUNTER — Other Ambulatory Visit (HOSPITAL_COMMUNITY)
Admission: RE | Admit: 2021-05-28 | Discharge: 2021-05-28 | Disposition: A | Discharge status: Home / Self Care | Payer: BC Managed Care – PPO | Source: Ambulatory Visit | Attending: Obstetrics and Gynecology | Admitting: Obstetrics and Gynecology

## 2021-05-28 ENCOUNTER — Encounter: Payer: Self-pay | Admitting: Obstetrics and Gynecology

## 2021-05-28 ENCOUNTER — Ambulatory Visit (INDEPENDENT_AMBULATORY_CARE_PROVIDER_SITE_OTHER): Payer: BC Managed Care – PPO | Admitting: Obstetrics and Gynecology

## 2021-05-28 VITALS — BP 128/74 | HR 77 | Ht 61.5 in | Wt 219.0 lb

## 2021-05-28 DIAGNOSIS — Z30011 Encounter for initial prescription of contraceptive pills: Secondary | ICD-10-CM

## 2021-05-28 DIAGNOSIS — N912 Amenorrhea, unspecified: Secondary | ICD-10-CM | POA: Diagnosis not present

## 2021-05-28 DIAGNOSIS — Z01812 Encounter for preprocedural laboratory examination: Secondary | ICD-10-CM | POA: Diagnosis not present

## 2021-05-28 LAB — PREGNANCY, URINE: Preg Test, Ur: NEGATIVE

## 2021-05-28 MED ORDER — NORETHIN ACE-ETH ESTRAD-FE 1-20 MG-MCG PO TABS: 1.0000 | ORAL_TABLET | Freq: Every day | ORAL | 0 refills | Status: DC

## 2021-05-28 MED ORDER — MEDROXYPROGESTERONE ACETATE 5 MG PO TABS: 5.0000 mg | ORAL_TABLET | Freq: Every day | ORAL | 0 refills | Status: DC

## 2021-05-28 NOTE — Patient Instructions (Signed)
Oral Contraception Information Oral contraceptive pills (OCPs) are medicines taken by mouth to prevent pregnancy. They work by: Preventing the ovaries from releasing eggs. Thickening mucus in the lower part of the uterus (cervix). This prevents sperm from entering the uterus. Thinning the lining of the uterus (endometrium). This prevents a fertilized egg from attaching to the endometrium. OCPs are highly effective when taken exactly as prescribed. However, OCPs do not prevent STIs (sexually transmitted infections). Using condoms while on an OCP can help prevent STIs. What happens before starting OCPs? Before you start taking OCPs: You may have a physical exam, blood test, and Pap test. Your health care provider will make sure you are a good candidate for oral contraception. OCPs are not a good option for certain women, such as: Women who smoke and are older than age 35. Women who have or have had certain conditions, such as: A history of high blood pressure. Deep vein thrombosis. Pulmonary embolism. Stroke. Cardiovascular disease. Peripheral vascular disease. Ask your health care provider about the possible side effects of the OCP you may be prescribed. Be aware that it can take 2-3 months for your body to adjustto changes in hormone levels. Types of oral contraception  Birth control pills contain the hormones estrogen and progestin (synthetic progesterone) or progestin only. The combination pill This type of pill contains estrogen and progestin hormones. Conventional contraception pills come in packs of 21 or 28 pills. Some packs with 28-day pills contain estrogen and progestin for the first 21-24 days. Hormone-free tablets, called placebos, are taken for the final 4-7 days. You should have menstrual bleeding during the time you take the placebos. In packs with 21 tablets, you take no pills for 7 days. Menstrual bleeding occurs during these days. (Some people prefer taking a pill for 28  days to help establish a routine). Extended-interval contraception pills come in packs of 91 pills. The first 84 tablets have both estrogen and progestin. The last 7 pills are placebos. Menstrual bleeding occurs during the placebo days. With this schedule, menstrual bleeding happens once every 3 months. Continuous contraception pills come in packs of 28 pills. All pills in the pack contain estrogen and progestin. With this schedule, regular menstrual bleeding does not happen, but there may be spotting or irregular bleeding. Progestin-only pills This type of pill is often called the mini-pill and contains the progestin hormone only. It comes in packs of 28 pills. In some packs, the last 4 pills are placebos. The pill must be taken at the same time every day. This is very important to prevent pregnancy. Menstrual bleeding may not be regular orpredictable. What are the advantages? Oral contraception provides reliable and continuous contraception if taken as directed. It may treat or decrease symptoms of: Menstrual period cramps. Irregular menstrual cycle or bleeding. Heavy menstrual flow. Abnormal uterine bleeding. Acne, depending on the type of pill. Polycystic ovarian syndrome (POS). Endometriosis. Iron deficiency anemia. Premenstrual symptoms, including severe irritability, depression, or anxiety. It also may: Reduce the risk of endometrial and ovarian cancer. Be used as emergency contraception. Prevent ectopic pregnancies and infections of the fallopian tubes. What can make OCPs less effective? OCPs may be less effective if: You forget to take the pill every day. For progestin-only pills, it is especially important to take the pill at the same time each day. Even taking it 3 hours late can increase the risk of pregnancy. You have a stomach or intestinal disease that reduces your body's ability to absorb the pill. You take   OCPs with other medicines that make OCPs less effective, such as  antibiotics, certain HIV medicines, and some seizure medicines. You take expired OCPs. You forget to restart the pill after 7 days of not taking it. This refers to the packs of 21 pills. What are the side effects and risks? OCPs can sometimes cause side effects, such as: Headache. Depression. Trouble sleeping. Nausea and vomiting. Breast tenderness. Irregular bleeding or spotting during the first several months. Bloating or fluid retention. Increase in blood pressure. Combination pills may slightly increase the risk of: Blood clots. Heart attack. Stroke. Follow these instructions at home: Follow instructions from your health care provider about how to start taking your first cycle of OCPs. Depending on when you start the pill, you may need to use a backup form of birth control, such as condoms, during the first week.Make sure you know what steps to take if you forget to take the pill. Summary Oral contraceptive pills (OCPs) are medicines taken by mouth to prevent pregnancy. They are highly effective when taken exactly as prescribed. OCPs contain a combination of the hormones estrogen and progestin (synthetic progesterone) or progestin only. Before you start taking the pill, you may have a physical exam, blood test, and Pap test. Your health care provider will make sure you are a good candidate for oral contraception. The combination pill may come in a 21-day pack, a 28-day pack, or a 91-day pack. Progestin-only pills come in packs of 28 pills. OCPs can sometimes cause side effects, such as headache, nausea, breast tenderness, or irregular bleeding. This information is not intended to replace advice given to you by your health care provider. Make sure you discuss any questions you have with your healthcare provider. Document Revised: 11/02/2019 Document Reviewed: 10/11/2019 Elsevier Patient Education  2022 [employer]  

## 2021-05-28 NOTE — Progress Notes (Signed)
GYNECOLOGY  VISIT ?  ?HPI: ?31 y.o.   Married Other or two or more races Hispanic or Latino  female   ?[id number] with No LMP recorded. (Menstrual status: Irregular Periods).   ?here for endo bx. She has a h/o amenorrhea, no cycle for well over a year. She was last sexually active last week and used a condom.  ?She has not had unprotected sex for well over 2 weeks. ? ?She has a long h/o oligomenorrhea/amenorrhea. Prior normal TSH, FSH and prolactin (in 6/20).  ?She underwent a hysteroscopy, D&C in 6/22 for AUB, she had a benign polyp and proliferative endometrium on pathology.  ? ?GYNECOLOGIC HISTORY: ?No LMP recorded. (Menstrual status: Irregular Periods). ?Contraception:condom ?Menopausal hormone therapy: none ?       ?OB History   ? ? Gravida  ?3  ? Para  ?2  ? Term  ?2  ? Preterm  ?   ? AB  ?1  ? Living  ?2  ?  ? ? SAB  ?1  ? IAB  ?   ? Ectopic  ?   ? Multiple  ?   ? Live Births  ?   ?   ?  ?  ?    ? ?Patient Active Problem List  ? Diagnosis Date Noted  ? Type 2 diabetes mellitus with other specified complication (HCC) 04/20/2021  ? Anemia 04/14/2021  ? Femoral acetabular impingement 02/29/2020  ? PCOS (polycystic ovarian syndrome) 04/07/2018  ? Anxiety and depression 04/07/2018  ? Iron deficiency anemia 04/07/2018  ? Obesity 09/21/2017  ? Abnormal uterine bleeding (AUB) 01/21/2017  ? ? ?Past Medical History:  ?Diagnosis Date  ? Abnormal uterine bleeding (AUB)   ? Arthritis   ? HIPS  ? Complication of anesthesia   ? hard to wake  ? Depression   ? Endometrial polyp   ? GERD (gastroesophageal reflux disease)   ? History of 2019 novel coronavirus disease (COVID-19) 01/2020  ? per pt mild symptoms that resolved  ? History of gestational diabetes   ? History of pregnancy induced hypertension   ? IDA (iron deficiency anemia)   ? PCOS (polycystic ovarian syndrome)   ? Prediabetes   ? Wears glasses   ? ? ?Past Surgical History:  ?Procedure Laterality Date  ? HYSTEROSCOPY WITH D & C N/A 10/17/2018  ? Procedure: DILATION AND  CURRETAGE WITH HYSTEROSCOPY;  Surgeon: Dara Lords, MD;  Location: [facility] [place];  Service: Gynecology;  Laterality: N/A;  ? HYSTEROSCOPY WITH D & C N/A 08/12/2020  ? Procedure: DILATATION AND CURETTAGE /HYSTEROSCOPY;  Surgeon: Romualdo Bolk, MD;  Location: [facility];  Service: Gynecology;  Laterality: N/A;  ? LAPAROSCOPY Left 10/17/2018  ? Procedure: LAPAROSCOPIC  REMOVAL OF PARATUBAL CYST;  Surgeon: Dara Lords, MD;  Location: [facility] ;  Service: Gynecology;  Laterality: Left;  ? WISDOM TOOTH EXTRACTION    ? TEEN  ? ? ?Current Outpatient Medications  ?Medication Sig Dispense Refill  ? Antiseborrheic Products, Misc. (PROMISEB EX) Apply topically.    ? ferrous sulfate 325 (65 FE) MG EC tablet Take 325 mg by mouth daily.    ? levocetirizine (XYZAL ALLERGY 24HR) 5 MG tablet Take 1 tablet (5 mg total) by mouth every evening. 30 tablet 8  ? metFORMIN (GLUCOPHAGE) 500 MG tablet TAKE 1 TABLET BY MOUTH EVERY DAY WITH BREAKFAST 90 tablet 1  ? metroNIDAZOLE (METROGEL) 0.75 % gel Apply 1 application topically 2 (two) times daily.    ?  Vitamin D, Ergocalciferol, (DRISDOL) 1.25 MG (50000 UNIT) CAPS capsule Take 1 capsule (50,000 Units total) by mouth every 7 (seven) days. 12 capsule 0  ? ?No current facility-administered medications for this visit.  ?  ? ?ALLERGIES: Rhofade [oxymetazoline hcl] ? ?Family History  ?Problem Relation Age of Onset  ? Diabetes Mother   ? Hypertension Mother   ? Hyperlipidemia Mother   ? ? ?Social History  ? ?Socioeconomic History  ? Marital status: Married  ?  Spouse name: Not on file  ? Number of children: Not on file  ? Years of education: Not on file  ? Highest education level: Associate degree: academic program  ?Occupational History  ? Not on file  ?Tobacco Use  ? Smoking status: Never  ? Smokeless tobacco: Never  ?Vaping Use  ? Vaping Use: Never used  ?Substance and Sexual Activity  ? Alcohol use: Yes  ?  Comment: Occasional   ? Drug use: Never  ? Sexual activity: Yes  ?  Birth control/protection: Condom  ?  Comment: 1st intercourse 31 yo-More than 5 partners  ?Other Topics Concern  ? Not on file  ?Social History Narrative  ? Not on file  ? ?Social Determinants of Health  ? ?Financial Resource Strain: Low Risk   ? Difficulty of Paying Living Expenses: Not hard at all  ?Food Insecurity: No Food Insecurity  ? Worried About Programme researcher, broadcasting/film/videounning Out of Food in the Last Year: Never true  ? Ran Out of Food in the Last Year: Never true  ?Transportation Needs: No Transportation Needs  ? Lack of Transportation (Medical): No  ? Lack of Transportation (Non-Medical): No  ?Physical Activity: Unknown  ? Days of Exercise per Week: 3 days  ? Minutes of Exercise per Session: Patient refused  ?Stress: Stress Concern Present  ? Feeling of Stress : Rather much  ?Social Connections: Unknown  ? Frequency of Communication with Friends and Family: More than three times a week  ? Frequency of Social Gatherings with Friends and Family: Once a week  ? Attends Religious Services: Patient refused  ? Active Member of Clubs or Organizations: No  ? Attends BankerClub or Organization Meetings: Not on file  ? Marital Status: Married  ?Intimate Partner Violence: Not on file  ? ? ?Review of Systems  ?All other systems reviewed and are negative. ? ?PHYSICAL EXAMINATION:   ? ?BP 128/74   Pulse 77   Ht 5' 1.5" (1.562 m)   Wt 219 lb (99.3 kg)   SpO2 100%   BMI 40.71 kg/m?     ?General appearance: alert, cooperative and appears stated age ?Pelvic: External genitalia:  no lesions ?             Urethra:  normal appearing urethra with no masses, tenderness or lesions ?             Bartholins and Skenes: normal    ?             Vagina: normal appearing vagina with normal color and discharge, no lesions ?             Cervix:  no lesions ? ?The risks of endometrial biopsy were reviewed and a consent was obtained.  ?A speculum was placed in the vagina and the cervix was cleansed with betadine. A  tenaculum was placed on the cervix and the uterine evacuator was placed into the endometrial cavity. The uterus sounded to 9 cm. The endometrial biopsy was performed, taking care to get a  representative sample, sampling 360 degrees of the uterine cavity. Minimal tissue was obtained, the uterine cavity felt gritty throughout. The tenaculum and speculum were removed. There were no complications.  ? ? ?Chaperone was present for exam. ? ?1. Amenorrhea ?Long term issues with amenorrhea.  ?- Endometrial biopsy ?Given minimal tissue on biopsy (definitely in the cavity), will recheck hormone levels ?- Follicle stimulating hormone ?- Estradiol ?- Prolactin ?- medroxyPROGESTERone (PROVERA) 5 MG tablet; Take 1 tablet (5 mg total) by mouth daily.  Dispense: 5 tablet; Refill: 0 ?-She will call if she doesn't get a w/d bleed. ?-Will start on OCP's on the first day of her cycle. No contraindications to OCP's, risks reviewed and information given ?- norethindrone-ethinyl estradiol-FE (LOESTRIN FE) 1-20 MG-MCG tablet; Take 1 tablet by mouth daily.  Dispense: 84 tablet; Refill: 0 ?-F/U in 3 months ? ?2. Pre-procedure lab exam ?- Pregnancy, urine: negative ? ?3. Encounter for initial prescription of contraceptive pills ?She would like to start on the one month pill, discussed possibly changing her to the 3 month pill in the future ?- norethindrone-ethinyl estradiol-FE (LOESTRIN FE) 1-20 MG-MCG tablet; Take 1 tablet by mouth daily.  Dispense: 84 tablet; Refill: 0 ? ? ?

## 2021-05-29 LAB — PROLACTIN: Prolactin: 6 ng/mL

## 2021-05-29 LAB — FOLLICLE STIMULATING HORMONE: FSH: 6.7 m[IU]/mL

## 2021-05-29 LAB — ESTRADIOL: Estradiol: 43 pg/mL

## 2021-06-01 LAB — SURGICAL PATHOLOGY

## 2021-07-12 ENCOUNTER — Other Ambulatory Visit: Payer: Self-pay | Admitting: Family Medicine

## 2021-07-12 DIAGNOSIS — E559 Vitamin D deficiency, unspecified: Secondary | ICD-10-CM

## 2021-07-21 ENCOUNTER — Ambulatory Visit: Payer: BC Managed Care – PPO | Admitting: Physician Assistant

## 2021-08-12 ENCOUNTER — Other Ambulatory Visit: Payer: Self-pay | Admitting: Obstetrics and Gynecology

## 2021-08-12 DIAGNOSIS — N912 Amenorrhea, unspecified: Secondary | ICD-10-CM

## 2021-08-12 DIAGNOSIS — Z30011 Encounter for initial prescription of contraceptive pills: Secondary | ICD-10-CM

## 2021-08-12 NOTE — Telephone Encounter (Signed)
3 month BCP follow up scheduled on 08/27/21

## 2021-08-15 ENCOUNTER — Other Ambulatory Visit: Payer: Self-pay | Admitting: Family Medicine

## 2021-08-15 DIAGNOSIS — E559 Vitamin D deficiency, unspecified: Secondary | ICD-10-CM

## 2021-08-27 ENCOUNTER — Telehealth: Payer: Self-pay | Admitting: *Deleted

## 2021-08-27 ENCOUNTER — Encounter: Payer: Self-pay | Admitting: Obstetrics and Gynecology

## 2021-08-27 ENCOUNTER — Ambulatory Visit (INDEPENDENT_AMBULATORY_CARE_PROVIDER_SITE_OTHER): Payer: BC Managed Care – PPO | Admitting: Obstetrics and Gynecology

## 2021-08-27 VITALS — BP 134/82 | HR 64 | Ht 61.5 in | Wt 217.0 lb

## 2021-08-27 DIAGNOSIS — Z8639 Personal history of other endocrine, nutritional and metabolic disease: Secondary | ICD-10-CM | POA: Diagnosis not present

## 2021-08-27 DIAGNOSIS — N970 Female infertility associated with anovulation: Secondary | ICD-10-CM | POA: Diagnosis not present

## 2021-08-27 DIAGNOSIS — Z3041 Encounter for surveillance of contraceptive pills: Secondary | ICD-10-CM

## 2021-08-27 DIAGNOSIS — Z3169 Encounter for other general counseling and advice on procreation: Secondary | ICD-10-CM | POA: Diagnosis not present

## 2021-08-27 DIAGNOSIS — Z8619 Personal history of other infectious and parasitic diseases: Secondary | ICD-10-CM

## 2021-08-27 MED ORDER — LETROZOLE 2.5 MG PO TABS: ORAL_TABLET | ORAL | 0 refills | Status: DC

## 2021-08-27 MED ORDER — NORETHIN ACE-ETH ESTRAD-FE 1-20 MG-MCG PO TABS
1.0000 | ORAL_TABLET | Freq: Every day | ORAL | 0 refills | Status: DC
Start: 2021-08-27 — End: 2021-12-15

## 2021-08-27 NOTE — Patient Instructions (Addendum)
Continue OCP's until the HSG is done and for the rest of that cycle.    On day #3 of your cycle start the letrozol  Start the BBT chart as soon as you finish letrozol, you can also use an ovulation predictor kit  You are most likely to ovulate between cycle day #14-19  You should have intercourse every 24-48 hours starting 5 days after finishing the letrozol    Preparing for Pregnancy If you are planning to become pregnant, talk to your health care provider about preconception care. This type of care helps you prepare for a safe and healthy pregnancy. During this visit, your health care provider will: Do a complete physical exam, including a Pap test. Take your complete medical history. Give you information, answer your questions, and help you resolve problems. Preconception checklist Medical history Tell your health care provider about any medical conditions you have or have had. Your pregnancy or your ability to become pregnant may be affected by long-term (chronic) conditions, such as: Diabetes. High blood pressure (hypertension). Thyroid problems. Tell your health care provider about your family's medical history and your partner's medical history. Tell your health care provider if you have or have had any sexually transmitted infections, orSTIs. These can affect your pregnancy. In some cases, they can be passed to your baby. If needed, discuss the benefits of genetic testing. This test checks for conditions that may be passed from parent to child. Tell your health care provider about: Any problems you had getting pregnant or while pregnant. Any medicines you take. These include vitamins, herbal supplements, and over-the-counter medicines. Your history of getting vaccines. Discuss any vaccines that you may need. Diet Ask your health care provider about what foods to eat in order to get a balance of nutrients. This is especially important when you are pregnant or preparing to become  pregnant. It is recommended that women of childbearing age take a folic acid supplement of 400 mcg daily and eat foods rich in folic acid to prevent certain birth defects. Ask your health care provider to help you reach a healthy weight before pregnancy. If you are overweight, you may have a higher risk for certain problems. These include hypertension, diabetes, and early (preterm) birth. If you are underweight, you are more likely to have a baby who has a low birth weight. Lifestyle, work, and home Let your health care provider know about: Any lifestyle habits that you have, such as use of alcohol, drugs, or tobacco products. Fun and leisure activities that may put you at risk during pregnancy, such as downhill skiing and certain exercise programs. Any plans to travel out of the country, especially to places with an active [place] virus outbreak. Harmful substances that you may be exposed to at work or at home. These include chemicals, pesticides, radiation, and substances from cat litter boxes. Any concerns you have for your safety at home. Mental health Tell your health care provider about: Any history of mental health conditions, including feelings of depression, sadness, or anxiety. Any medicines that you take for a mental health condition. These include herbs and supplements. How do I know that I am pregnant? You may be pregnant if you have been sexually active and you miss your period. Other symptoms of early pregnancy include: Mild cramping. Very light vaginal bleeding (spotting). Feeling more tired than usual. Nausea and vomiting. These may be signs of morning sickness. Take a home pregnancy test if you have any of these symptoms. This test checks  for a hormone in your urine called human chorionic gonadotropin, or hCG. A woman's body begins to make this hormone during early pregnancy. These tests are very accurate. Wait until at least the first day after you miss your period to take a  home pregnancy test. If the test shows that you are pregnant, call your health care provider for a prenatal care visit. What should I do if I become pregnant? Schedule a visit with your health care provider as soon as you suspect you are pregnant. Talk to your health care provider if you are taking prescription medicines to determine if they are safe to take during pregnancy. You may continue to have sex if it does not cause pain or other problems, such as vaginal bleeding. Follow these instructions at home: Eating and drinking  Follow instructions from your health care provider about eating or drinking restrictions. Drink enough fluid to keep your urine pale yellow. Eat a balanced diet. This includes fresh fruits and vegetables, whole grains, lean meats, low-fat dairy products, healthy fats, and foods that are high in fiber. Ask to meet with a nutritionist or registered dietitian for help with meal planning and goals. Avoid eating raw or undercooked meat and seafood. Avoid eating or drinking unpasteurized dairy products. Lifestyle     Get regular exercise. Try to be active for at least 30 minutes a day on most days of the week. Ask your health care provider which activities are safe during pregnancy. Maintain a healthy weight. Avoid toxic fumes and chemicals. Avoid cleaning cat litter boxes. Cat feces may contain a harmful parasite called toxoplasma. Avoid travel to countries where [place] virus is common. Do not use any products that contain nicotine or tobacco, such as cigarettes, e-cigarettes, and chewing tobacco. If you need help quitting, ask your health care provider. Do not drink alcohol or use drugs. General instructions Keep an accurate record of your menstrual periods. This makes it easier for your health care provider to determine your baby's due date. Take over-the-counter and prescription medicines only as told by your health care provider. Begin taking prenatal vitamins and  folic acid supplements daily as directed. Manage any chronic conditions, such as hypertension and diabetes, as told by your health care provider. This is important. Summary If you are planning to become pregnant, talk to your health care provider about preconception care. This is an important part of planning for a healthy pregnancy. Women of childbearing age should take 237 mcg of folic acid daily in addition to eating a diet rich in folic acid. This will prevent certain birth defects. Schedule a visit with your health care provider as soon as you suspect you are pregnant. Tell your health care provider about your medical history, lifestyle activities, home safety, and other things that may concern you. This information is not intended to replace advice given to you by your health care provider. Make sure you discuss any questions you have with your health care provider. Document Revised: 11/01/2018 Document Reviewed: 11/01/2018 Elsevier Patient Education  [place].  Female Infertility  Female infertility refers to a woman's inability to get pregnant (conceive) after a year of having sex regularly (or after 6 months in women over age 61) without using birth control. Infertility can also mean that a woman is not able to carry a pregnancy to full term. Both women and men can experience fertility problems. What are the causes? This condition may be caused by: Problems with reproductive organs. Infertility can result if  a woman: Is not ovulating or is ovulating irregularly. Has a blockage or scarring in the fallopian tubes. Has uterine fibroids. This is a benign mass of tissue or muscle (tumor) that can develop in the uterus. Has an abnormally shaped uterus. Has an abnormally short cervix or a cervix that does not remain closed during a pregnancy. Certain medical conditions. These may include: Polycystic ovary syndrome (PCOS). This is a hormonal disorder that can cause small cysts to  grow on the ovaries. This is the most common cause of infertility in women. Endometriosis. This is a condition in which the tissue that lines the uterus (endometrium) grows outside of its normal location. Cancer and cancer treatments, such as chemotherapy or radiation. Premature ovarian failure. This is when ovaries stop producing eggs and hormones before age 68. Sexually transmitted infections, such as chlamydia or gonorrhea. Autoimmune disorders. These are disorders in which the body's defense system (immune system) attacks normal, healthy cells. Infertility can be linked to more than one cause. For some women, the cause of infertility is not known (unexplained infertility). What increases the risk? The following factors may make you more likely to develop this condition: Age. A woman's fertility declines with age, especially after her mid-63s. Stress. Smoking. Being underweight or overweight. Drinking too much alcohol. Using drugs such as anabolic steroids, cocaine, and marijuana. Exercising excessively. What are the signs or symptoms? The main sign of infertility in women is the inability to get pregnant or carry a pregnancy to full term. How is this diagnosed? This condition may be diagnosed by: Checking whether you are ovulating each month. The tests may include: Blood tests to check hormone levels. An ultrasound of the ovaries. Taking a sample of the tissue that lines the uterus and checking it under a microscope (endometrial biopsy). Doing additional tests. This is done if ovulation is normal. Tests may include: Hysterosalpingogram. This X-ray test can show the shape of the uterus and whether the fallopian tubes are open. Laparoscopy. This test uses a lighted tube (laparoscope) to look for problems in the fallopian tubes and other organs. Transvaginal ultrasound. This imaging test is used to check for abnormalities in the uterus and ovaries. Hysteroscopy. This test uses a lighted  tube to check for problems in the cervix and the uterus. To be diagnosed with infertility, both partners will have a physical exam. Both partners will also have an extensive medical and sexual history taken. Additional tests may be done. How is this treated? Treatment depends on the cause of infertility. Most cases of infertility in women are treated with medicine or surgery. Women may take medicine to: Correct ovulation problems. Treat other health conditions. Surgery may be done to: Repair damage to the ovaries, fallopian tubes, cervix, or uterus. Remove growths from the uterus. Remove scar tissue from the uterus, pelvis, or other organs. Assisted reproductive technology (ART) Assisted reproductive technology (ART) refers to all treatments and procedures that combine eggs and sperm outside the body to try to help a couple conceive. ART is often combined with fertility drugs to stimulate ovulation. Sometimes ART is done using eggs retrieved from another woman's body (donor eggs) or from previously frozen fertilized eggs (embryos). There are different types of ART. These include: Intrauterine insemination (IUI). A long, thin tube is used to place sperm directly into a woman's uterus. This procedure: Is effective for infertility caused by sperm problems, including low sperm count and low motility. Can be used in combination with fertility drugs. In vitro fertilization (IVF). This  is done when a woman's fallopian tubes are blocked or when a man has low sperm count. In this procedure: Fertility drugs are used to stimulate the ovaries to produce multiple eggs. Once mature, these eggs are removed from the body and combined with the sperm to be fertilized. The fertilized eggs are then placed into the woman's uterus. Follow these instructions at home: Lifestyle If you drink alcohol, limit how much you have to 0-1 drink a day. Do not use any products that contain nicotine or tobacco. These products  include cigarettes, chewing tobacco, and vaping devices, such as e-cigarettes. If you need help quitting, ask your health care provider. Practice stress reduction techniques that work well for you, such as regular physical activity, meditation, or deep breathing. Make dietary changes to lose weight or maintain a healthy weight. Work with your health care provider and a dietitian to set a weight-loss goal that is healthy and reasonable for you. General instructions Take over-the-counter and prescription medicines only as told by your health care provider. Seek support from a counselor or support group to talk about your concerns related to infertility. Couples counseling may be helpful for you and your partner. Keep all follow-up visits. This is important. Where to find support Resolve - The National Infertility Association: resolve.org Contact a health care provider if: You feel that stress is interfering with your life and relationships. You have side effects from treatments for infertility. Summary Female infertility refers to a woman's inability to get pregnant (conceive) after a year of having sex regularly (or after 6 months in women over age 34) without using birth control. To be diagnosed with infertility, both partners will have a physical exam. Both partners will also have an extensive medical and sexual history taken. Seek support from a counselor or support group to talk about your concerns related to infertility. Couples counseling may be helpful for you and your partner. This information is not intended to replace advice given to you by your health care provider. Make sure you discuss any questions you have with your health care provider. Document Revised: 04/01/2020 Document Reviewed: 04/01/2020 Elsevier Patient Education  [place]. Hysterosalpingography  Hysterosalpingography is a procedure to look inside a woman's womb (uterus) and fallopian tubes. During this  procedure, contrast dye is injected into the uterus through the vagina and cervix. X-rays are then taken. The dye makes the uterus and fallopian tubes show up clearly on the X-rays and may show whether the tubes are blocked, scarred, or if there are any other abnormalities. This procedure may be done: To determine if there are tumors, scars, or other abnormalities in the uterus. To determine why a woman is unable to get pregnant or has had repeated pregnancy losses. To make sure the fallopian tubes are completely blocked a few months after having certain tubal sterilization procedures. Tell a health care provider about: Any allergies you have. All medicines you are taking, including vitamins, herbs, eye drops, creams, and over-the-counter medicines. Any problems you or family members have had with anesthetic medicines. Any blood disorders you have. Any surgeries you have had. Any medical conditions you have. Whether you are pregnant or may be pregnant. Whether you have been diagnosed with an STI (sexually transmitted infection) or you think you have an STI. What are the risks? Generally, this is a safe procedure. However, problems may occur, including: Infection in the lining of the uterus or fallopian tubes. Allergic reaction to medicines or dyes. A hole (perforation) in the  uterus or fallopian tubes. Damage to other structures or organs. What happens before the procedure? Medicines Ask your health care provider about: Changing or stopping your regular medicines. This is especially important if you are taking diabetes medicines or blood thinners. Taking medicines such as aspirin and ibuprofen. These medicines can thin your blood. Do not take these medicines unless your health care provider tells you to take them. Taking over-the-counter medicines, vitamins, herbs, and supplements. General instructions Schedule the procedure after your menstrual period stops, but before your next  ovulation. This is usually between day 5 and day 10 of your last period. Day 1 is the first day of your period. Plan to have a responsible adult take you home from the hospital or clinic. Plan to have a responsible adult care for you for the time you are told after you leave the hospital or clinic. This is important. Empty your bladder before the procedure begins. What happens during the procedure? You may be given: A medicine to help you relax (sedative). A medicine to numb the area (local anesthetic). An over-the-counter pain medicine. You will lie down on your back and place your feet into footrests (stirrups). A device called a speculum will be inserted into your vagina. This allows your health care provider to see inside your vagina through to your cervix. Your cervix will be washed with a germ-killing soap. A medicine may be injected into your cervix to numb it. A thin, flexible tube will be passed through your cervix into your uterus. Contrast dye will be passed through the tube and into the uterus. This may cause cramping. Several X-rays will be taken as the contrast dye spreads through the uterus and into the fallopian tubes. You may be asked to your change position and roll from side to side if needed. The tube will be removed. The contrast dye will flow out through your vagina. The procedure may vary among health care providers and hospitals. What can I expect after procedure? You may have mild cramping and vaginal bleeding. This should go away after a short time. Most of the contrast dye will flow out of your vagina. This fluid will be sticky and may have blood in it. You may want to wear a sanitary pad. Do not use a tampon. You have mild dizziness or nausea. Follow these instructions at home: Do not douche, use tampons, or have sex until your health care provider approves. Do not take baths, swim, or use a hot tub until your health care provider approves. Take showers instead of  baths for 2 weeks, or for as long as told by your health care provider. If you were given a sedative during the procedure, it can affect you for several hours. Do not drive or operate machinery until your health care provider says that it is safe. Take over-the-counter and prescription medicines only as told by your health care provider. It is up to you to get the results of your procedure. Ask your health care provider, or the department that is doing the procedure, when your results will be ready. Keep all follow-up visits. This is important. Contact a health care provider if: You have a fever or chills. You faint. You have severe cramping. Your skin has a rash, and is itchy or swollen. You have a bad-smelling vaginal discharge. You have vaginal bleeding that lasts for more than 4 days. Get help right away if: You have nausea and vomiting. You have heavy vaginal bleeding that soaks more than  one pad every hour. You have severe abdominal pain. Summary Hysterosalpingography is a procedure in which a woman's uterus and fallopian tubes are examined. During this procedure, contrast dye is injected into the uterus through the vagina and cervix. X-rays are then taken. The dye helps the uterus and fallopian tubes show up clearly on the X-rays. Schedule the procedure after your menstrual period stops, but before your next ovulation. This is usually between day 5 and day 10 of your last period. After the procedure, you may have mild cramping and vaginal bleeding. This should go away after a short time. Do not douche, use tampons, or have sex until your health care provider approves. This information is not intended to replace advice given to you by your health care provider. Make sure you discuss any questions you have with your health care provider. Document Revised: 10/15/2020 Document Reviewed: 09/19/2019 Elsevier Patient Education  [place].

## 2021-08-27 NOTE — Progress Notes (Signed)
GYNECOLOGY  VISIT   HPI: 31 y.o.   Married Other or two or more races Hispanic or Latino  female   [phone] with Patient's last menstrual period was 08/24/2021.   here for 3 month follow up after starting OCP for Amenorrhea.  She states that she has been having a period monthly. She states that her last 2 periods have started on 3-4 placebo pill. She has been fine on the pill, less emotional. Periods have been fine.  She has decided to try and get pregnant again. She and her husband were trying to get pregnant for over a year. During that time she was only randomly having cycles. Husband is the father of her youngest child (86 years old).   She had chlamydia 2 x prior to her first pregnancy.   She has a h/o DM, last HgbA1C was 6.7. She has been better about taking her metformin daily since then.   She has had chicken pox in the past  No know h/o chromosomal or genetic abnormalities.   GYNECOLOGIC HISTORY: Patient's last menstrual period was 08/24/2021. Contraception:OCP Menopausal hormone therapy: none         OB History     Gravida  3   Para  2   Term  2   Preterm      AB  1   Living  2      SAB  1   IAB      Ectopic      Multiple      Live Births                 Patient Active Problem List   Diagnosis Date Noted   Type 2 diabetes mellitus with other specified complication (HCC) 04/20/2021   Anemia 04/14/2021   Femoral acetabular impingement 02/29/2020   PCOS (polycystic ovarian syndrome) 04/07/2018   Anxiety and depression 04/07/2018   Iron deficiency anemia 04/07/2018   Obesity 09/21/2017   Abnormal uterine bleeding (AUB) 01/21/2017    Past Medical History:  Diagnosis Date   Abnormal uterine bleeding (AUB)    Arthritis    HIPS   Complication of anesthesia    hard to wake   Depression    Endometrial polyp    GERD (gastroesophageal reflux disease)    History of 2019 novel coronavirus disease (COVID-19) 01/2020   per pt mild symptoms that  resolved   History of gestational diabetes    History of pregnancy induced hypertension    IDA (iron deficiency anemia)    PCOS (polycystic ovarian syndrome)    Prediabetes    Wears glasses     Past Surgical History:  Procedure Laterality Date   HYSTEROSCOPY WITH D & C N/A 10/17/2018   Procedure: DILATION AND CURRETAGE WITH HYSTEROSCOPY;  Surgeon: Dara Lords, MD;  Location: [facility];  Service: Gynecology;  Laterality: N/A;   HYSTEROSCOPY WITH D & C N/A 08/12/2020   Procedure: DILATATION AND CURETTAGE /HYSTEROSCOPY;  Surgeon: Romualdo Bolk, MD;  Location: [facility];  Service: Gynecology;  Laterality: N/A;   LAPAROSCOPY Left 10/17/2018   Procedure: LAPAROSCOPIC  REMOVAL OF PARATUBAL CYST;  Surgeon: Dara Lords, MD;  Location: [facility];  Service: Gynecology;  Laterality: Left;   WISDOM TOOTH EXTRACTION     TEEN    Current Outpatient Medications  Medication Sig Dispense Refill   metFORMIN (GLUCOPHAGE) 500 MG tablet TAKE 1 TABLET BY MOUTH EVERY DAY WITH BREAKFAST (Patient taking differently:  Take 500 mg by mouth daily with breakfast.) 90 tablet 1   ferrous sulfate 325 (65 FE) MG EC tablet Take 325 mg by mouth daily.     JUNEL FE 1/20 1-20 MG-MCG tablet TAKE 1 TABLET BY MOUTH EVERY DAY 28 tablet 0   levocetirizine (XYZAL ALLERGY 24HR) 5 MG tablet Take 1 tablet (5 mg total) by mouth every evening. 30 tablet 8   Vitamin D, Ergocalciferol, (DRISDOL) 1.25 MG (50000 UNIT) CAPS capsule Take 1 capsule (50,000 Units total) by mouth every 7 (seven) days. 12 capsule 0   No current facility-administered medications for this visit.     ALLERGIES: Rhofade [oxymetazoline hcl]  Family History  Problem Relation Age of Onset   Diabetes Mother    Hypertension Mother    Hyperlipidemia Mother     Social History   Socioeconomic History   Marital status: Married    Spouse name: Not on file   Number of children: Not on file    Years of education: Not on file   Highest education level: Associate degree: academic program  Occupational History   Not on file  Tobacco Use   Smoking status: Never   Smokeless tobacco: Never  Vaping Use   Vaping Use: Never used  Substance and Sexual Activity   Alcohol use: Yes    Comment: Occasional   Drug use: Never   Sexual activity: Yes    Birth control/protection: Condom    Comment: 1st intercourse 31 yo-More than 5 partners  Other Topics Concern   Not on file  Social History Narrative   Not on file   Social Determinants of Health   Financial Resource Strain: Low Risk  (04/16/2021)   Overall Financial Resource Strain (CARDIA)    Difficulty of Paying Living Expenses: Not hard at all  Food Insecurity: No Food Insecurity (04/16/2021)   Hunger Vital Sign    Worried About Running Out of Food in the Last Year: Never true    Ran Out of Food in the Last Year: Never true  Transportation Needs: No Transportation Needs (04/16/2021)   PRAPARE - Administrator, Civil Service (Medical): No    Lack of Transportation (Non-Medical): No  Physical Activity: Unknown (04/16/2021)   Exercise Vital Sign    Days of Exercise per Week: 3 days    Minutes of Exercise per Session: Patient refused  Stress: Stress Concern Present (04/16/2021)   [employer] of Occupational Health - Occupational Stress Questionnaire    Feeling of Stress : Rather much  Social Connections: Unknown (04/16/2021)   Social Connection and Isolation Panel [NHANES]    Frequency of Communication with Friends and Family: More than three times a week    Frequency of Social Gatherings with Friends and Family: Once a week    Attends Religious Services: Patient refused    Database administrator or Organizations: No    Attends Engineer, structural: Not on file    Marital Status: Married  Catering manager Violence: Not on file    Review of Systems  All other systems reviewed and are negative.   PHYSICAL  EXAMINATION:    BP 134/82   Pulse 64   Ht 5' 1.5" (1.562 m)   Wt 217 lb (98.4 kg)   LMP 08/24/2021   SpO2 99%   BMI 40.34 kg/m     General appearance: alert, cooperative and appears stated age  17. Encounter for surveillance of contraceptive pills Doing well, but would like to get  pregnant. Will stay on OCP's while we work her up for prior h/o infertility.  - norethindrone-ethinyl estradiol-FE (JUNEL FE 1/20) 1-20 MG-MCG tablet; Take 1 tablet by mouth daily.  Dispense: 84 tablet; Refill: 0  2. Encounter for preconception consultation Start PNV -H/O chlamydia, will set up HSG, husband will do SA with his MD - Hemoglobin A1c - Rubella screen -information about good health prior to pregnancy was given  3. Infertility associated with anovulation H/O obestiy, PCOS and anovulation. Discussed letrozol, reviewed that it is not FDA approved.  - letrozole (FEMARA) 2.5 MG tablet; Take one tablet a day on days #3-7 of your cycle  Dispense: 5 tablet; Refill: 0 -Once her HSG is normal, will do the letrozol with her next cycle  4. History of chlamydia Will get an HSG (triage will place the order)  5. History of diabetes mellitus - Hemoglobin A1c

## 2021-08-27 NOTE — Telephone Encounter (Signed)
-----   Message from Romualdo Bolk, MD sent at 08/27/2021  9:38 AM EDT ----- Please schedule her for an HSG next week. Her cycle started on 08/24/21.  Thanks, Noreene Larsson

## 2021-08-27 NOTE — Telephone Encounter (Signed)
Patient scheduled on 08/31/21 @ 3:10pm  at [address] [facility] location, no sexually from day 1 of cycle until after HSG. Must cancel within 24 hours to avoid $75 fee.   Patient informed with all the above.

## 2021-08-28 ENCOUNTER — Telehealth (INDEPENDENT_AMBULATORY_CARE_PROVIDER_SITE_OTHER): Payer: BC Managed Care – PPO | Admitting: Family Medicine

## 2021-08-28 DIAGNOSIS — L03012 Cellulitis of left finger: Secondary | ICD-10-CM | POA: Diagnosis not present

## 2021-08-28 LAB — RUBELLA SCREEN: Rubella: <0.9 Index — ABNORMAL LOW

## 2021-08-28 LAB — HEMOGLOBIN A1C
Hgb A1c MFr Bld: 6.2 % of total Hgb — ABNORMAL HIGH (ref <5.7)
Mean Plasma Glucose: 131 mg/dL
eAG (mmol/L): 7.3 mmol/L

## 2021-08-28 MED ORDER — AMOXICILLIN-POT CLAVULANATE 500-125 MG PO TABS: 1.0000 | ORAL_TABLET | Freq: Two times a day (BID) | ORAL | 0 refills | Status: AC

## 2021-08-28 NOTE — Progress Notes (Signed)
Virtual Visit via Video Note  I connected with Chloe Cervantes on 08/28/21 at 10:45 AM EDT by a video enabled telemedicine application 2/2 COVID-19 pandemic and verified that I am speaking with the correct person using two identifiers.  Location patient: home Location provider:work or home office Persons participating in the virtual visit: patient, provider  I discussed the limitations of evaluation and management by telemedicine and the availability of in person appointments. The patient expressed understanding and agreed to proceed.   HPI:  Pt states she is getting over her cold symptoms.  Biggest concern is her L 4 th digit is infected.  1 wk ago pt clipped a hangnail.  Then noticed a blister that she popped.  She then developed an ulceration, tightness, drainage, area becoming larger.  ROS: See pertinent positives and negatives per HPI.  Past Medical History:  Diagnosis Date   Abnormal uterine bleeding (AUB)    Arthritis    HIPS   Complication of anesthesia    hard to wake   Depression    Endometrial polyp    GERD (gastroesophageal reflux disease)    History of 2019 novel coronavirus disease (COVID-19) 01/2020   per pt mild symptoms that resolved   History of gestational diabetes    History of pregnancy induced hypertension    IDA (iron deficiency anemia)    PCOS (polycystic ovarian syndrome)    Prediabetes    Wears glasses     Past Surgical History:  Procedure Laterality Date   HYSTEROSCOPY WITH D & C N/A 10/17/2018   Procedure: DILATION AND CURRETAGE WITH HYSTEROSCOPY;  Surgeon: Dara Lords, MD;  Location: [facility];  Service: Gynecology;  Laterality: N/A;   HYSTEROSCOPY WITH D & C N/A 08/12/2020   Procedure: DILATATION AND CURETTAGE /HYSTEROSCOPY;  Surgeon: Romualdo Bolk, MD;  Location: [facility];  Service: Gynecology;  Laterality: N/A;   LAPAROSCOPY Left 10/17/2018   Procedure: LAPAROSCOPIC  REMOVAL OF PARATUBAL CYST;   Surgeon: Dara Lords, MD;  Location: [facility];  Service: Gynecology;  Laterality: Left;   WISDOM TOOTH EXTRACTION     TEEN    Family History  Problem Relation Age of Onset   Diabetes Mother    Hypertension Mother    Hyperlipidemia Mother       Current Outpatient Medications:    ferrous sulfate 325 (65 FE) MG EC tablet, Take 325 mg by mouth daily., Disp: , Rfl:    levocetirizine (XYZAL ALLERGY 24HR) 5 MG tablet, Take 1 tablet (5 mg total) by mouth every evening., Disp: 30 tablet, Rfl: 8   metFORMIN (GLUCOPHAGE) 500 MG tablet, TAKE 1 TABLET BY MOUTH EVERY DAY WITH BREAKFAST (Patient taking differently: Take 500 mg by mouth daily with breakfast.), Disp: 90 tablet, Rfl: 1   norethindrone-ethinyl estradiol-FE (JUNEL FE 1/20) 1-20 MG-MCG tablet, Take 1 tablet by mouth daily., Disp: 84 tablet, Rfl: 0   VITAMIN D PO, Take by mouth., Disp: , Rfl:    letrozole (FEMARA) 2.5 MG tablet, Take one tablet a day on days #3-7 of your cycle (Patient not taking: Reported on 08/28/2021), Disp: 5 tablet, Rfl: 0   Vitamin D, Ergocalciferol, (DRISDOL) 1.25 MG (50000 UNIT) CAPS capsule, Take 1 capsule (50,000 Units total) by mouth every 7 (seven) days. (Patient not taking: Reported on 08/28/2021), Disp: 12 capsule, Rfl: 0  EXAM:  VITALS per patient if applicable:  GENERAL: alert, oriented, appears well and in no acute distress  HEENT: atraumatic, conjunctiva clear,  no obvious abnormalities on inspection of external nose and ears  NECK: normal movements of the head and neck  LUNGS: on inspection no signs of respiratory distress, breathing rate appears normal, no obvious gross SOB, gasping or wheezing  CV: no obvious cyanosis  Skin: left fourth digit with edema, erythema, and purulent drainage from ulcerated area proximal to cuticle  MS: moves all visible extremities without noticeable abnormality  PSYCH/NEURO: pleasant and cooperative, no obvious depression or anxiety, speech  and thought processing grossly intact  ASSESSMENT AND PLAN:  Discussed the following assessment and plan:  Paronychia of left ring finger -Discussed supportive care including warm compresses to promote further drainage, keeping finger clean and dry -Start ABX -Discussed S/S of infection.  Given strict cautions.  - Plan: amoxicillin-clavulanate (AUGMENTIN) 500-125 MG tablet  F/u prn  I discussed the assessment and treatment plan with the patient. The patient was provided an opportunity to ask questions and all were answered. The patient agreed with the plan and demonstrated an understanding of the instructions.   The patient was advised to call back or seek an in-person evaluation if the symptoms worsen or if the condition fails to improve as anticipated.   Deeann Saint, MD

## 2021-08-31 ENCOUNTER — Other Ambulatory Visit: Payer: BC Managed Care – PPO

## 2021-08-31 ENCOUNTER — Ambulatory Visit
Admission: RE | Admit: 2021-08-31 | Discharge: 2021-08-31 | Disposition: A | Discharge status: Home / Self Care | Payer: BC Managed Care – PPO | Source: Ambulatory Visit | Attending: Obstetrics and Gynecology | Admitting: Obstetrics and Gynecology

## 2021-08-31 DIAGNOSIS — N970 Female infertility associated with anovulation: Secondary | ICD-10-CM | POA: Diagnosis not present

## 2021-09-02 ENCOUNTER — Telehealth: Payer: Self-pay | Admitting: Family Medicine

## 2021-09-02 NOTE — Telephone Encounter (Signed)
Pt called to request a Rubella shot. Pt states she is trying to conceive and was told she needs this shot.  Please advise.  [phone]

## 2021-09-04 NOTE — Telephone Encounter (Signed)
I do not recommend rubella vaccine if she is trying to get pregnant. She can discuss this with Dr. Salomon Fick during her next CPE, before if needed. Thanks, BJ

## 2021-09-04 NOTE — Telephone Encounter (Signed)
Attempt to reach pt. Left message for patient to call back.

## 2021-09-08 NOTE — Telephone Encounter (Signed)
Attempted to reach patient. Left voicemail to call back also that the message is release to patient myChart.

## 2021-10-28 DIAGNOSIS — Z111 Encounter for screening for respiratory tuberculosis: Secondary | ICD-10-CM | POA: Diagnosis not present

## 2021-10-28 DIAGNOSIS — Z0184 Encounter for antibody response examination: Secondary | ICD-10-CM | POA: Diagnosis not present

## 2021-11-11 ENCOUNTER — Telehealth: Payer: Self-pay | Admitting: Family Medicine

## 2021-11-11 DIAGNOSIS — L80 Vitiligo: Secondary | ICD-10-CM

## 2021-11-11 NOTE — Telephone Encounter (Signed)
Pt requesting a new referral to another dermatologist, says the current one is not respecting her wishes regarding her vitiligo

## 2021-11-21 ENCOUNTER — Other Ambulatory Visit: Payer: Self-pay | Admitting: Family Medicine

## 2021-11-21 DIAGNOSIS — E119 Type 2 diabetes mellitus without complications: Secondary | ICD-10-CM

## 2021-11-21 DIAGNOSIS — E282 Polycystic ovarian syndrome: Secondary | ICD-10-CM

## 2021-11-25 NOTE — Telephone Encounter (Signed)
Ok

## 2021-11-27 ENCOUNTER — Telehealth: Payer: Self-pay | Admitting: Family Medicine

## 2021-11-27 NOTE — Telephone Encounter (Signed)
Pt has blood sugar concerns and wanted to ask MD if she should up her metformin doses?  Please call [phone]

## 2021-11-27 NOTE — Telephone Encounter (Signed)
Derm referral placed

## 2021-11-27 NOTE — Addendum Note (Signed)
Addended by: Anderson Malta on: 11/27/2021 09:29 AM   Modules accepted: Orders

## 2021-12-03 NOTE — Telephone Encounter (Signed)
Not enough info in message to give an appropriate response.  Have pt make an appt.

## 2021-12-03 NOTE — Telephone Encounter (Signed)
Pt was called to schedule and office visit to discuss her concerns with MD.  No answer. LVM for Pt to call back to make an appt.

## 2021-12-03 NOTE — Telephone Encounter (Signed)
Pt has been sch 12-07-2021 4 pm

## 2021-12-07 ENCOUNTER — Encounter: Payer: Self-pay | Admitting: Family Medicine

## 2021-12-07 ENCOUNTER — Ambulatory Visit (INDEPENDENT_AMBULATORY_CARE_PROVIDER_SITE_OTHER): Payer: BC Managed Care – PPO | Admitting: Family Medicine

## 2021-12-07 VITALS — BP 120/78 | HR 90 | Temp 98.4°F | Wt 216.0 lb

## 2021-12-07 DIAGNOSIS — L80 Vitiligo: Secondary | ICD-10-CM

## 2021-12-07 DIAGNOSIS — L719 Rosacea, unspecified: Secondary | ICD-10-CM | POA: Diagnosis not present

## 2021-12-07 DIAGNOSIS — E162 Hypoglycemia, unspecified: Secondary | ICD-10-CM

## 2021-12-07 DIAGNOSIS — L729 Follicular cyst of the skin and subcutaneous tissue, unspecified: Secondary | ICD-10-CM

## 2021-12-07 DIAGNOSIS — M25571 Pain in right ankle and joints of right foot: Secondary | ICD-10-CM | POA: Diagnosis not present

## 2021-12-07 MED ORDER — METRONIDAZOLE 1 % EX GEL: Freq: Every day | CUTANEOUS | 0 refills | Status: DC

## 2021-12-07 NOTE — Patient Instructions (Addendum)
It appears a referral was sent to the skin surgery center here in [place].  I do not see any additional information about the scheduling for this appointment.  You can contact their office at [phone].  If you are still having difficulty with scheduling appointment please let the office know and we can send a referral elsewhere.  Her prescription for the metronidazole gel for rosacea was sent to your pharmacy.  Monitor your blood sugar with continuous glucometer given in clinic.

## 2021-12-07 NOTE — Progress Notes (Signed)
Subjective:    Patient ID: Chloe Cervantes, female    DOB: October 02, 1990, 31 y.o.   MRN: [id number]  Chief Complaint  Patient presents with   Medication Refill    Metformin    Cyst    Felt a bump under rt armpit, thinks it want away, but wants it checked.   Ankle Pain    Rt ankle pain, after sitting too long, but when walks it goes away  Pt accompanied by her husband.  HPI Patient is a 31 yo female with pmh sig for GERD, PCOS, history of depression, arthritis, AUB, pre-DM, anxiety, was seen today for ongoing concerns.  Pt with increasing vitiligo.  Face now becoming sunburned despite use of sunscreen when at kids soccer games.  Causing rosacea to spread.  Patient waiting on information for new dermatology referral.  Previously seen by 2 other dermatologist who had conflicting diagnoses.  One of the providers stated patient needed 2 different steroids while the other said she did not.    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  Pt states she felt shaky.  Ate candy and drank something sweet then checked bs which was 86.  Pt does not typically eat breakfast.  States may have had snacks that day while at work.  Had tacos for dinner the night before.  COVID, flu, RSV, and Hcg were negative.  Pt taking Metformin 500 mg for PCOS and h/o DM II.  Hgb A1C was 6.2% on 08/27/21 and 6.7% on 04/16/21 when dx'd with DM.  Pt does not have a personal glucometer.  Patient drinking mostly water throughout the day.   Pt with R ankle pain x months.  Noted upon standing after sitting while at work.  Pt notes h/o R ankle injury in grade school.  States tripped over a rock and rolled ankle.  Pt denies edema, redness, and instability.  Pt had a bump in R axilla x 1 wk or so.  Bump resolved but area of skin still feels sore.  Pt states bump was not visible but could be felt under the skin.  Using one of a few different deodorants.  Past Medical History:  Diagnosis Date    Abnormal uterine bleeding (AUB)    Arthritis    HIPS   Complication of anesthesia    hard to wake   Depression    Endometrial polyp    GERD (gastroesophageal reflux disease)    History of 2019 novel coronavirus disease (COVID-19) 01/2020   per pt mild symptoms that resolved   History of gestational diabetes    History of pregnancy induced hypertension    IDA (iron deficiency anemia)    PCOS (polycystic ovarian syndrome)    Prediabetes    Wears glasses     Allergies  Allergen Reactions   Rhofade [Oxymetazoline Hcl] Rash    ROS General: Denies fever, chills, night sweats, changes in weight, changes in appetite  +shaky HEENT: Denies headaches, ear pain, changes in vision, rhinorrhea, sore throat CV: Denies CP, palpitations, SOB, orthopnea Pulm: Denies SOB, cough, wheezing GI: Denies abdominal pain, nausea, vomiting, diarrhea, constipation GU: Denies dysuria, hematuria, frequency, vaginal discharge Msk: Denies muscle cramps, joint pains + R ankle pain Neuro: Denies weakness, numbness, tingling Skin: Denies rashes, bruising  +vitiligo, rosacea, soreness in R axilla Psych: Denies depression, anxiety, hallucinations     Objective:    Blood pressure 120/78, pulse 90, temperature 98.4 F (36.9 C),  temperature source Oral, weight 216 lb (98 kg), SpO2 96 %.  Gen. Pleasant, well-nourished, in no distress, normal affect   HEENT: Early/AT, face symmetric, conjunctiva clear, no scleral icterus, PERRLA, EOMI, nares patent without drainage, pharynx without erythema or exudate. Neck: No JVD, no thyromegaly, no carotid bruits Lungs: no accessory muscle use, CTAB, no wheezes or rales Cardiovascular: RRR, no m/r/g, no peripheral edema Abdomen: BS present, soft, NT/ND, no hepatosplenomegaly. Musculoskeletal: No deformities, no cyanosis or clubbing, normal tone Neuro:  A&Ox3, CN II-XII intact, normal gait Skin:  Warm, dry, intact.  Rosacea on bilateral cheeks.  Increased areas of  hypopigmentation on forehead, bridge of nose, bilateral cheeks, forearms   Wt Readings from Last 3 Encounters:  12/07/21 216 lb (98 kg)  08/27/21 217 lb (98.4 kg)  05/28/21 219 lb (99.3 kg)    Lab Results  Component Value Date   WBC 7.2 04/16/2021   HGB 12.6 04/16/2021   HCT 38.5 04/16/2021   PLT 389.0 04/16/2021   GLUCOSE 112 (H) 04/16/2021   CHOL 176 11/07/2020   TRIG 81.0 11/07/2020   HDL 53.60 11/07/2020   LDLCALC 106 (H) 11/07/2020   ALT 52 (H) 04/16/2021   AST 33 04/16/2021   NA 135 04/16/2021   K 4.1 04/16/2021   CL 100 04/16/2021   CREATININE 0.62 04/16/2021   BUN 11 04/16/2021   CO2 28 04/16/2021   TSH 1.40 04/16/2021   HGBA1C 6.2 (H) 08/27/2021    Assessment/Plan:  Vitiligo -Worsening -Discussed wearing protective clothing when outdoors and other ways to decrease sunburn -Awaiting information regarding dermatology referral. -Given handout  Rosacea  -Becoming worse -Referral to dermatology placed.  Awaiting information regarding scheduling appointment - Plan: metroNIDAZOLE (METROGEL) 1 % gel  Hypoglycemia -Concern for hypoglycemic episode while at work -Discussed this S/S of hypo and hyperglycemia -Patient encouraged to eat something for breakfast each morning -Given sample of continuous glucometer in clinic to monitor blood sugar over the next 2 weeks.  Based on readings will decide on continuing metformin versus holding the medication. -Given strict precautions  Acute right ankle pain -Stiffness/pain in ankle after prolonged sitting -Discussed exercises to strengthen ankle -Consider compression socks or ankle brace for support while at work -Patient encouraged to continue wearing supportive shoes with good arch support.  Cyst of skin -resolved -Advised cyst likely 2/2 deodorant use.  Patient advised to change deodorant. -Can use warm compress for continued soreness or return of bump.  F/u prn in 1 months  Grier Mitts, MD

## 2021-12-08 ENCOUNTER — Encounter: Payer: Self-pay | Admitting: Family Medicine

## 2021-12-08 ENCOUNTER — Telehealth: Payer: Self-pay | Admitting: Family Medicine

## 2021-12-08 NOTE — Telephone Encounter (Signed)
Pt call and stated she want to know if she can get a  diabetic kit to have at home because she don't have one.

## 2021-12-09 ENCOUNTER — Other Ambulatory Visit: Payer: Self-pay | Admitting: Obstetrics and Gynecology

## 2021-12-09 DIAGNOSIS — Z3041 Encounter for surveillance of contraceptive pills: Secondary | ICD-10-CM

## 2021-12-09 MED ORDER — ONETOUCH ULTRA 2 W/DEVICE KIT: 1.0000 | PACK | Freq: Every day | 2 refills | Status: AC

## 2021-12-09 MED ORDER — GLUCOSE BLOOD VI STRP: 1.0000 | ORAL_STRIP | Freq: Every day | 12 refills | Status: DC

## 2021-12-09 MED ORDER — ONETOUCH ULTRASOFT LANCETS MISC: 1.0000 | Freq: Every day | 12 refills | Status: DC

## 2021-12-09 NOTE — Telephone Encounter (Signed)
Rx sent 

## 2021-12-10 ENCOUNTER — Telehealth: Payer: Self-pay | Admitting: Family Medicine

## 2021-12-10 NOTE — Telephone Encounter (Signed)
Pt is calling to report BS reading with dexcom on 10-24 at 447 am BS 67 at 651 pm BS 67 and 10-25 at 801 am BS 182 and 628 pm BS 88 and then on 10-26 at 139 am BS 246 and Pt did finger stick at 140 am BS 214 and at 620 am  BS 161 with dexcom. Pt would like to know what to do

## 2021-12-11 NOTE — Telephone Encounter (Signed)
Have pt keep a log of what she has been eating and at what times along with the blood sugar readings if she has not already been doing so.

## 2021-12-11 NOTE — Telephone Encounter (Signed)
Last annual exam 08/2021 HSG was normal

## 2021-12-14 ENCOUNTER — Other Ambulatory Visit: Payer: Self-pay | Admitting: Obstetrics and Gynecology

## 2021-12-14 DIAGNOSIS — N970 Female infertility associated with anovulation: Secondary | ICD-10-CM

## 2021-12-14 NOTE — Telephone Encounter (Signed)
Pharmacy sent refill requesting for BCP also,(I routed that refill request to you already) reports she takes due to PCOS and won't' have cycle if she doesn't take BCP.  Patient LMP: 12/14/21 this refill is for femara, states last month her cycle didn't last the whole 7 days. She asked does she continue femara day 3-7 the whole 7 days even if her cycle stops? Please advise

## 2021-12-14 NOTE — Telephone Encounter (Signed)
Responded to pt via mychart message about previous message.

## 2021-12-16 NOTE — Telephone Encounter (Signed)
Her cycle doesn't have to last the whole 7 days. She should take the pills with the femara. Make sure she didn't restart the OCP's.  She needs to do BBT this month and send it to me for review. If she doesn't ovulate we will need to increase her dose. I can help bring on a cycle with provera not with OCP's. She should use the OCP's if she isn't trying to get pregnant.  Please make sure she understands this. If any chance of pregnancy she should check a UPT prior to starting the femara.

## 2021-12-17 ENCOUNTER — Other Ambulatory Visit: Payer: Self-pay

## 2021-12-17 ENCOUNTER — Encounter: Payer: Self-pay | Admitting: *Deleted

## 2021-12-17 MED ORDER — ONETOUCH ULTRASOFT LANCETS MISC: 1.0000 | Freq: Every day | 2 refills | Status: DC

## 2021-12-21 NOTE — Telephone Encounter (Signed)
Sent my chart on 12/16/21  patient read message "Last read by Chloe Cervantes at  8:57 AM on 12/17/2021."

## 2021-12-30 IMAGING — XA DG FLUORO GUIDE NDL PLC/BX
4 series · 4 of 4 positions shown · non-contrast
Comparison: none

CLINICAL DATA: Chronic bilateral hip pain.

[Series 1: ortho adipose · 1 of 1 slices shown (1 of 4)]
[im 1/1]
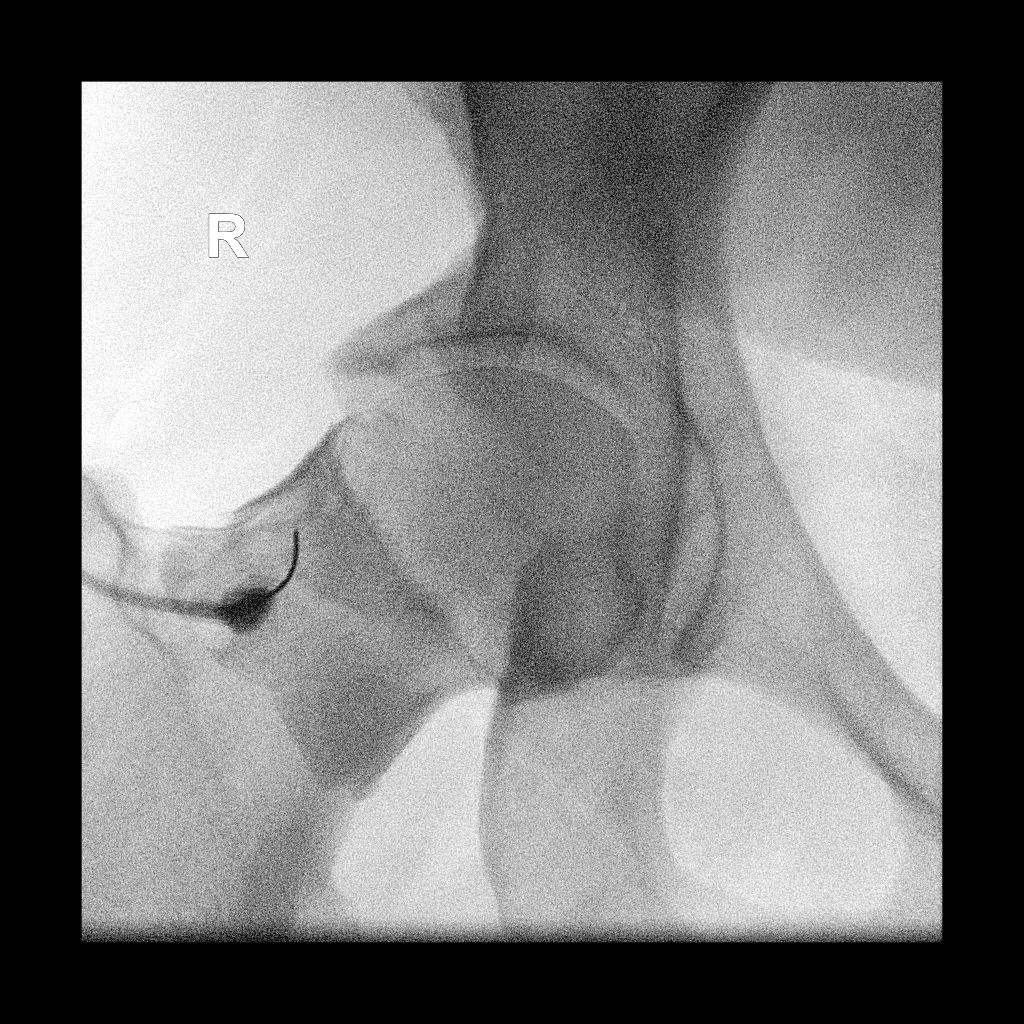

[Series 2: ortho adipose · 1 of 1 slices shown (2 of 4)]
[im 1/1]
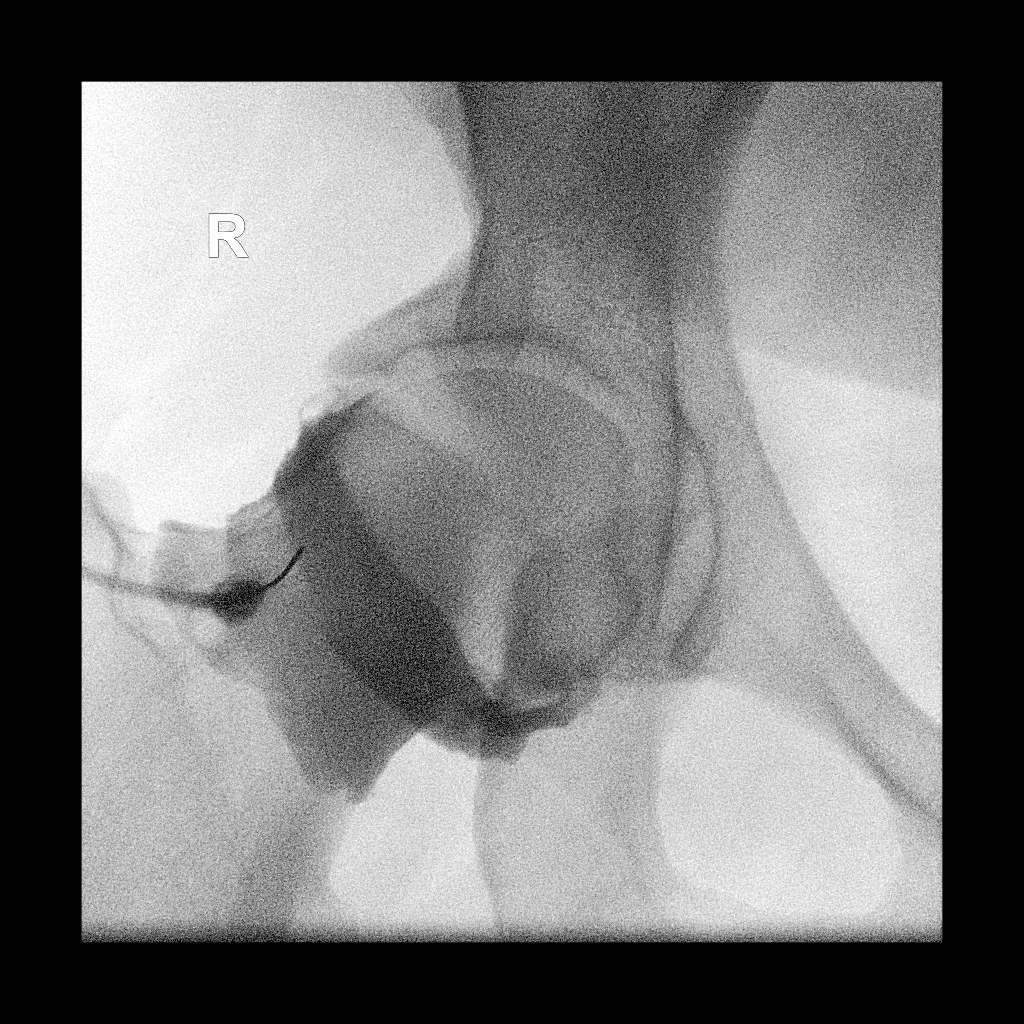

[Series 3: ortho adipose · 1 of 1 slices shown (3 of 4)]
[im 1/1]
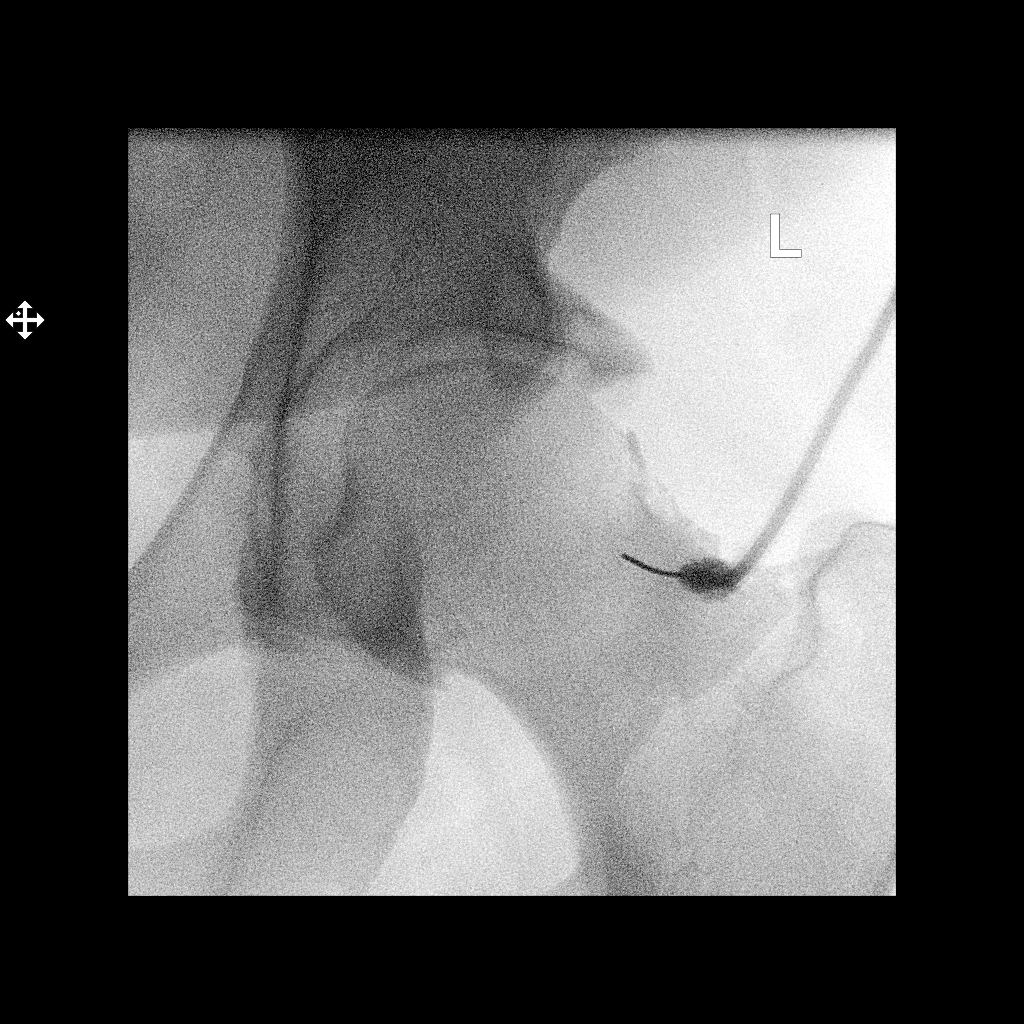

[Series 4: ortho adipose · 1 of 1 slices shown (4 of 4)]
[im 1/1]
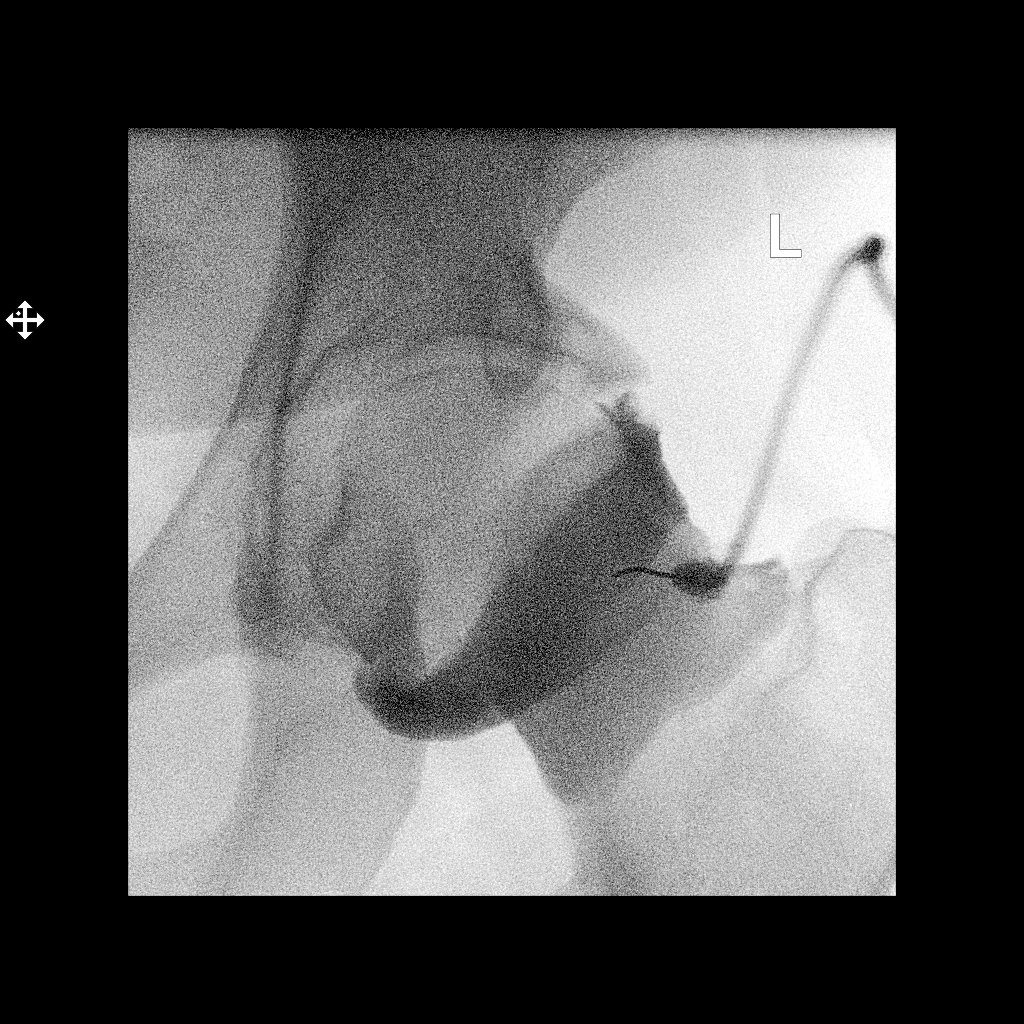

[4 of 4 positions shown; findings below may reference images not displayed]

FLUOROSCOPY TIME:  Radiation Exposure Index (as provided by the
fluoroscopic device): 2.9 mGy

Fluoroscopy Time:  23 seconds

Number of Acquired Images:  0

PROCEDURE:
The risks and benefits of the procedure were discussed with the
patient, and written informed consent was obtained. The patient
stated no history of allergy to contrast media. A formal timeout
procedure was performed with the patient according to departmental
protocol.

The patient was placed supine on the fluoroscopy table and the
bilateral hip joints were identified under fluoroscopy. The skin
overlying the bilateral hip joints was subsequently cleaned with
Betadine and a sterile drape was placed over the areas of interest.
4 ml 1% Lidocaine was used to anesthetize the skin around the needle
insertion sites.

A 22 gauge spinal needle was inserted into each hip joint under
fluoroscopy.

12 ml of gadolinium mixture (0.1 ml of Multihance mixed with 15 ml
of Isovue-M 200 contrast and 5 ml of sterile saline) were injected
into each hip joint.

The needles were removed and hemostasis was achieved. The patient
was subsequently transferred to MRI for imaging.
IMPRESSION: Technically successful bilateral hip injections for MRI.

## 2022-01-04 ENCOUNTER — Ambulatory Visit (INDEPENDENT_AMBULATORY_CARE_PROVIDER_SITE_OTHER): Payer: BC Managed Care – PPO | Admitting: Family Medicine

## 2022-01-04 VITALS — BP 124/78 | HR 81 | Temp 98.5°F | Wt 214.6 lb

## 2022-01-04 DIAGNOSIS — L719 Rosacea, unspecified: Secondary | ICD-10-CM | POA: Diagnosis not present

## 2022-01-04 DIAGNOSIS — E1169 Type 2 diabetes mellitus with other specified complication: Secondary | ICD-10-CM | POA: Diagnosis not present

## 2022-01-04 DIAGNOSIS — E282 Polycystic ovarian syndrome: Secondary | ICD-10-CM | POA: Diagnosis not present

## 2022-01-04 DIAGNOSIS — L80 Vitiligo: Secondary | ICD-10-CM | POA: Diagnosis not present

## 2022-01-04 MED ORDER — BLOOD GLUCOSE MONITOR KIT: PACK | 0 refills | Status: AC

## 2022-01-04 MED ORDER — ONETOUCH DELICA LANCETS 30G MISC: 3 refills | Status: DC

## 2022-01-04 MED ORDER — DEXCOM G7 SENSOR MISC: 1.0000 | 6 refills | Status: DC

## 2022-01-04 NOTE — Progress Notes (Signed)
Subjective:    Patient ID: Chloe Cervantes, female    DOB: 05-07-1990, 31 y.o.   MRN: [id number]  Chief Complaint  Patient presents with   Follow-up    Derm appointment in [place]. Not sure if is having an allergic reaction to med but face will get itchy and red, sometimes a bum will appear and then go away  Freestyle libre did not stay on, wants to see about dexcom sensor Ankle pain is still there    HPI Patient was seen today for f/u on several concerns.  Pt with increase vitiligo hypopigmented skin lesions.  Unsure if having a rxn to MetroGel as it occasionally will turn skin bright red upon application.  Patient notes continued sensitivity to sun and increased sunburns.  Increased areas of vitiligo are not worse to pt.  Derm appointment not until January.  Patient tried using freestyle libre however it did not stay on skin.  Patient notes some hyperglycemia as GCM stated 256, fs was 219.  Patient is not drinking soda and juice.  States insurance will cover Dexcom meter.  Patient with continued ankle pain.  Past Medical History:  Diagnosis Date   Abnormal uterine bleeding (AUB)    Arthritis    HIPS   Complication of anesthesia    hard to wake   Depression    Endometrial polyp    GERD (gastroesophageal reflux disease)    History of 2019 novel coronavirus disease (COVID-19) 01/2020   per pt mild symptoms that resolved   History of gestational diabetes    History of pregnancy induced hypertension    IDA (iron deficiency anemia)    PCOS (polycystic ovarian syndrome)    Prediabetes    Wears glasses     Allergies  Allergen Reactions   Rhofade [Oxymetazoline Hcl] Rash    ROS General: Denies fever, chills, night sweats, changes in weight, changes in appetite + sun sensitivity HEENT: Denies headaches, ear pain, changes in vision, rhinorrhea, sore throat CV: Denies CP, palpitations, SOB, orthopnea Pulm: Denies SOB, cough, wheezing GI: Denies abdominal pain, nausea,  vomiting, diarrhea, constipation GU: Denies dysuria, hematuria, frequency, vaginal discharge Msk: Denies muscle cramps, joint pains +ankle pain Neuro: Denies weakness, numbness, tingling Skin: Denies rashes, bruising + vitiligo Psych: Denies depression, anxiety, hallucinations      Objective:    Blood pressure 124/78, pulse 81, temperature 98.5 F (36.9 C), temperature source Oral, weight 214 lb 9.6 oz (97.3 kg), SpO2 99 %.  Gen. Pleasant, well-nourished, in no distress, normal affect   HEENT: [place]/AT, face symmetric, conjunctiva clear, no scleral icterus, PERRLA, EOMI, nares patent without drainage Lungs: no accessory muscle use, CTAB, no wheezes or rales Cardiovascular: RRR, no m/r/g, no peripheral edema Musculoskeletal: No deformities, no cyanosis or clubbing, normal tone Neuro:  A&Ox3, CN II-XII intact, normal gait Skin:  Warm, dry, intact.  Increased areas of hypopigmentation on face and chest.   Wt Readings from Last 3 Encounters:  01/04/22 214 lb 9.6 oz (97.3 kg)  12/07/21 216 lb (98 kg)  08/27/21 217 lb (98.4 kg)    Lab Results  Component Value Date   WBC 7.2 04/16/2021   HGB 12.6 04/16/2021   HCT 38.5 04/16/2021   PLT 389.0 04/16/2021   GLUCOSE 112 (H) 04/16/2021   CHOL 176 11/07/2020   TRIG 81.0 11/07/2020   HDL 53.60 11/07/2020   LDLCALC 106 (H) 11/07/2020   ALT 52 (H) 04/16/2021   AST 33 04/16/2021   NA 135 04/16/2021   K  4.1 04/16/2021   CL 100 04/16/2021   CREATININE 0.62 04/16/2021   BUN 11 04/16/2021   CO2 28 04/16/2021   TSH 1.40 04/16/2021   HGBA1C 6.2 (H) 08/27/2021    Assessment/Plan:  Vitiligo -Increasing -Has appointment with dermatology January 2024  Type 2 diabetes mellitus with other specified complication, without long-term current use of insulin ([place])  -Controlled. -Last hemoglobin A1c 6.2% on 08/27/2021 -Continue metformin 500 mg -Continue lifestyle modifications - Plan: Continuous Blood Gluc Sensor (DEXCOM G7 SENSOR) MISC, blood  glucose meter kit and supplies KIT  PCOS (polycystic ovarian syndrome) -Continue metformin -Continue follow-up with OB/GYN  - Plan: Continuous Blood Gluc Sensor (DEXCOM G7 SENSOR) MISC  Rosacea -hold metrogel.  Restart sparingly in a wk if needed. -consider erythematous rash on face like SLE, neuroendocrine tumor, etc. -Encouraged following with Derm  F/u in next few months, sooner if needed.  Grier Mitts, MD

## 2022-01-15 DIAGNOSIS — K760 Fatty (change of) liver, not elsewhere classified: Secondary | ICD-10-CM | POA: Insufficient documentation

## 2022-01-15 DIAGNOSIS — E559 Vitamin D deficiency, unspecified: Secondary | ICD-10-CM | POA: Insufficient documentation

## 2022-01-19 ENCOUNTER — Encounter: Payer: Self-pay | Admitting: Family Medicine

## 2022-02-04 ENCOUNTER — Other Ambulatory Visit: Payer: Self-pay | Admitting: Obstetrics and Gynecology

## 2022-02-04 DIAGNOSIS — N970 Female infertility associated with anovulation: Secondary | ICD-10-CM

## 2022-02-10 NOTE — Telephone Encounter (Signed)
Medication refill request: Femara  Last AEX:  04/14/21 Next AEX: none scheduled.  Last MMG (if hormonal medication request): na Refill authorized: #[id number]

## 2022-02-26 ENCOUNTER — Telehealth (INDEPENDENT_AMBULATORY_CARE_PROVIDER_SITE_OTHER): Payer: BC Managed Care – PPO | Admitting: Family Medicine

## 2022-02-26 ENCOUNTER — Encounter: Payer: Self-pay | Admitting: Family Medicine

## 2022-02-26 VITALS — HR 110 | Temp 98.2°F

## 2022-02-26 DIAGNOSIS — U071 COVID-19: Secondary | ICD-10-CM

## 2022-02-26 MED ORDER — MOLNUPIRAVIR EUA 200MG CAPSULE: 4.0000 | ORAL_CAPSULE | Freq: Two times a day (BID) | ORAL | 0 refills | Status: AC

## 2022-02-26 NOTE — Progress Notes (Signed)
Virtual Visit via Video Note  I connected with Chloe Cervantes on 02/26/22 at  2:15 PM EST by a video enabled telemedicine application 2/2 [id number] pandemic and verified that I am speaking with the correct person using two identifiers.  Location patient: home Location provider:work or home office Persons participating in the virtual visit: patient, provider  I discussed the limitations of evaluation and management by telemedicine and the availability of in person appointments. The patient expressed understanding and agreed to proceed.  Chief Complaint  Patient presents with   Covid Positive    Tested positive today. Sx of sore throat, nasal congestion, minor bodyache started this morning. Denied fever, chills, cough. Taking NyQuil severe flu/cold.     HPI: Pt is a 32 yo female seen for acute concern.  Pt states she isn't feeling too bad, but symptoms increasing.  Pt had an itch in her throat yesterday evening.  This am tired, sore throat, body aches, congestion.  Pt tested positive for COVID this am at work. Denies cough, HAs, diarrhea, decreased appetite.  Pt's husband was sick last week with cold-like symptoms that was non-COVID related.   ROS: See pertinent positives and negatives per HPI.  Past Medical History:  Diagnosis Date   Abnormal uterine bleeding (AUB)    Arthritis    HIPS   Complication of anesthesia    hard to wake   Depression    Endometrial polyp    GERD (gastroesophageal reflux disease)    History of 2019 novel coronavirus disease (COVID-19) 01/2020   per pt mild symptoms that resolved   History of gestational diabetes    History of pregnancy induced hypertension    IDA (iron deficiency anemia)    PCOS (polycystic ovarian syndrome)    Prediabetes    Wears glasses     Past Surgical History:  Procedure Laterality Date   HYSTEROSCOPY WITH D & C N/A 10/17/2018   Procedure: DILATION AND CURRETAGE WITH HYSTEROSCOPY;  Surgeon: Anastasio Auerbach, MD;  Location:  [facility];  Service: Gynecology;  Laterality: N/A;   HYSTEROSCOPY WITH D & C N/A 08/12/2020   Procedure: DILATATION AND CURETTAGE /HYSTEROSCOPY;  Surgeon: Salvadore Dom, MD;  Location: [facility];  Service: Gynecology;  Laterality: N/A;   LAPAROSCOPY Left 10/17/2018   Procedure: LAPAROSCOPIC  REMOVAL OF PARATUBAL CYST;  Surgeon: Anastasio Auerbach, MD;  Location: [facility];  Service: Gynecology;  Laterality: Left;   WISDOM TOOTH EXTRACTION     TEEN    Family History  Problem Relation Age of Onset   Diabetes Mother    Hypertension Mother    Hyperlipidemia Mother      EXAM:  VITALS per patient if applicable:  RR between 12-20 bpm  GENERAL: alert, oriented, appears well and in no acute distress  HEENT: atraumatic, conjunctiva clear, no obvious abnormalities on inspection of external nose and ears  NECK: normal movements of the head and neck  LUNGS: on inspection no signs of respiratory distress, breathing rate appears normal, no obvious gross SOB, gasping or wheezing  CV: no obvious cyanosis  MS: moves all visible extremities without noticeable abnormality  PSYCH/NEURO: pleasant and cooperative, no obvious depression or anxiety, speech and thought processing grossly intact  ASSESSMENT AND PLAN:  Discussed the following assessment and plan:  COVID-19 virus infection - Plan: molnupiravir EUA (LAGEVRIO) 200 mg CAPS capsule  Positive COVID-19 virus testing 02/26/2022. Discussed R/B/A of antiviral medication.  Will start molnupiravir.  Continue supportive care.  Quarantine discussed.  Given strict precautions.  Follow-up as needed.  I discussed the assessment and treatment plan with the patient. The patient was provided an opportunity to ask questions and all were answered. The patient agreed with the plan and demonstrated an understanding of the instructions.   The patient was advised to call back or seek an in-person  evaluation if the symptoms worsen or if the condition fails to improve as anticipated.   Billie Ruddy, MD

## 2022-03-09 DIAGNOSIS — S6992XA Unspecified injury of left wrist, hand and finger(s), initial encounter: Secondary | ICD-10-CM | POA: Diagnosis not present

## 2022-03-22 ENCOUNTER — Encounter: Payer: Self-pay | Admitting: Family Medicine

## 2022-03-22 ENCOUNTER — Ambulatory Visit (INDEPENDENT_AMBULATORY_CARE_PROVIDER_SITE_OTHER): Payer: BC Managed Care – PPO | Admitting: Family Medicine

## 2022-03-22 VITALS — BP 114/82 | HR 88 | Temp 98.3°F | Ht 61.5 in | Wt 213.8 lb

## 2022-03-22 DIAGNOSIS — N926 Irregular menstruation, unspecified: Secondary | ICD-10-CM | POA: Diagnosis not present

## 2022-03-22 DIAGNOSIS — L80 Vitiligo: Secondary | ICD-10-CM | POA: Diagnosis not present

## 2022-03-22 DIAGNOSIS — M25552 Pain in left hip: Secondary | ICD-10-CM | POA: Diagnosis not present

## 2022-03-22 DIAGNOSIS — E282 Polycystic ovarian syndrome: Secondary | ICD-10-CM | POA: Diagnosis not present

## 2022-03-22 DIAGNOSIS — D225 Melanocytic nevi of trunk: Secondary | ICD-10-CM | POA: Diagnosis not present

## 2022-03-22 DIAGNOSIS — Z0000 Encounter for general adult medical examination without abnormal findings: Secondary | ICD-10-CM | POA: Diagnosis not present

## 2022-03-22 DIAGNOSIS — E1169 Type 2 diabetes mellitus with other specified complication: Secondary | ICD-10-CM

## 2022-03-22 DIAGNOSIS — M25551 Pain in right hip: Secondary | ICD-10-CM | POA: Diagnosis not present

## 2022-03-22 DIAGNOSIS — R1030 Lower abdominal pain, unspecified: Secondary | ICD-10-CM | POA: Diagnosis not present

## 2022-03-22 DIAGNOSIS — L918 Other hypertrophic disorders of the skin: Secondary | ICD-10-CM | POA: Diagnosis not present

## 2022-03-22 LAB — COMPREHENSIVE METABOLIC PANEL
ALT: 44 U/L — ABNORMAL HIGH (ref 0–35)
AST: 23 U/L (ref 0–37)
Albumin: 4.7 g/dL (ref 3.5–5.2)
Alkaline Phosphatase: 58 U/L (ref 39–117)
BUN: 12 mg/dL (ref 6–23)
CO2: 25 mEq/L (ref 19–32)
Calcium: 9.4 mg/dL (ref 8.4–10.5)
Chloride: 100 mEq/L (ref 96–112)
Creatinine, Ser: 0.57 mg/dL (ref 0.40–1.20)
GFR: 122.76 mL/min (ref >60.00)
Glucose, Bld: 85 mg/dL (ref 70–99)
Potassium: 3.6 mEq/L (ref 3.5–5.1)
Sodium: 135 mEq/L (ref 135–145)
Total Bilirubin: 0.4 mg/dL (ref 0.2–1.2)
Total Protein: 7.9 g/dL (ref 6.0–8.3)

## 2022-03-22 LAB — SEDIMENTATION RATE: Sed Rate: 32 mm/hr — ABNORMAL HIGH (ref 0–20)

## 2022-03-22 LAB — LIPID PANEL
Cholesterol: 176 mg/dL (ref 0–200)
HDL: 59.7 mg/dL (ref >39.00)
LDL Cholesterol: 103 mg/dL — ABNORMAL HIGH (ref 0–99)
NonHDL: 116.18
Total CHOL/HDL Ratio: 3
Triglycerides: 64 mg/dL (ref 0.0–149.0)
VLDL: 12.8 mg/dL (ref 0.0–40.0)

## 2022-03-22 LAB — T4, FREE: Free T4: 0.85 ng/dL (ref 0.60–1.60)

## 2022-03-22 LAB — CBC WITH DIFFERENTIAL/PLATELET
Basophils Absolute: 0 10*3/uL (ref 0.0–0.1)
Basophils Relative: 0.5 % (ref 0.0–3.0)
Eosinophils Absolute: 0.2 10*3/uL (ref 0.0–0.7)
Eosinophils Relative: 1.7 % (ref 0.0–5.0)
HCT: 39.1 % (ref 36.0–46.0)
Hemoglobin: 13.3 g/dL (ref 12.0–15.0)
Lymphocytes Relative: 45.2 % (ref 12.0–46.0)
Lymphs Abs: 4.3 10*3/uL — ABNORMAL HIGH (ref 0.7–4.0)
MCHC: 33.9 g/dL (ref 30.0–36.0)
MCV: 85.2 fl (ref 78.0–100.0)
Monocytes Absolute: 0.5 10*3/uL (ref 0.1–1.0)
Monocytes Relative: 5.3 % (ref 3.0–12.0)
Neutro Abs: 4.4 10*3/uL (ref 1.4–7.7)
Neutrophils Relative %: 47.3 % (ref 43.0–77.0)
Platelets: 405 10*3/uL — ABNORMAL HIGH (ref 150.0–400.0)
RBC: 4.59 Mil/uL (ref 3.87–5.11)
RDW: 13.5 % (ref 11.5–15.5)
WBC: 9.4 10*3/uL (ref 4.0–10.5)

## 2022-03-22 LAB — POCT URINALYSIS DIPSTICK
Bilirubin, UA: NEGATIVE
Blood, UA: NEGATIVE
Glucose, UA: NEGATIVE
Ketones, UA: NEGATIVE
Leukocytes, UA: NEGATIVE
Nitrite, UA: NEGATIVE
Protein, UA: NEGATIVE
Urobilinogen, UA: 0.2 E.U./dL
pH, UA: 5.5 (ref 5.0–8.0)

## 2022-03-22 LAB — TSH: TSH: 1.6 u[IU]/mL (ref 0.35–5.50)

## 2022-03-22 LAB — HEMOGLOBIN A1C: Hgb A1c MFr Bld: 6.4 % (ref 4.6–6.5)

## 2022-03-22 LAB — POCT URINE PREGNANCY: Preg Test, Ur: NEGATIVE

## 2022-03-22 NOTE — Progress Notes (Signed)
Established Patient Office Visit   Subjective  Patient ID: Chloe Cervantes, female    DOB: 1990/09/05  Age: 32 y.o. MRN: [id number]  Chief Complaint  Patient presents with   Annual Exam   Patient accompanied by her husband. Pt is a 32 yo female with  has a past medical history of Abnormal uterine bleeding (AUB), Arthritis, Complication of anesthesia, Depression, Endometrial polyp, GERD (gastroesophageal reflux disease), History of 2019 novel coronavirus disease (COVID-19), History of gestational diabetes, History of pregnancy induced hypertension, IDA (iron deficiency anemia), PCOS (polycystic ovarian syndrome), Prediabetes seen for CPE.  Pt still having hip pain.  Wants to have surgery on hips, but told needed to lose weight.  Wants to become pregnant prior to turning 32 but having difficulty.  Taking metformin 1000 mg daily for PCOS.  Menses irregular.  Would like to see a fertility specialist.  Pt notes vitiligo becoming worse on face.  Endorses occasional lower abd pain.  BS still up and down.  A1C was 6.2% on 08/27/21.       ROS Negative unless stated above    Objective:     BP 114/82 (BP Location: Left Arm, Patient Position: Sitting, Cuff Size: Large)   Pulse 88   Temp 98.3 F (36.8 C) (Oral)   Ht 5' 1.5" (1.562 m)   Wt 213 lb 12.8 oz (97 kg)   LMP 01/19/2022 (Approximate)   SpO2 99%   BMI 39.74 kg/m    Physical Exam Constitutional:      Appearance: Normal appearance.  HENT:     Head: Normocephalic and atraumatic.     Right Ear: Tympanic membrane, ear canal and external ear normal.     Left Ear: Tympanic membrane, ear canal and external ear normal.     Nose: Nose normal.     Mouth/Throat:     Mouth: Mucous membranes are moist.     Pharynx: No oropharyngeal exudate or posterior oropharyngeal erythema.  Eyes:     General: No scleral icterus.    Extraocular Movements: Extraocular movements intact.     Conjunctiva/sclera: Conjunctivae normal.     Pupils:  Pupils are equal, round, and reactive to light.  Neck:     Thyroid: No thyromegaly.  Cardiovascular:     Rate and Rhythm: Normal rate and regular rhythm.     Pulses: Normal pulses.     Heart sounds: Normal heart sounds. No murmur heard.    No friction rub.  Pulmonary:     Effort: Pulmonary effort is normal.     Breath sounds: Normal breath sounds. No wheezing, rhonchi or rales.  Abdominal:     General: Bowel sounds are normal.     Palpations: Abdomen is soft.     Tenderness: There is no abdominal tenderness.  Musculoskeletal:        General: No deformity. Normal range of motion.  Lymphadenopathy:     Cervical: No cervical adenopathy.  Skin:    General: Skin is warm and dry.     Findings: No lesion.     Comments: Hypopigmented patches on face with some erythema.  Neurological:     General: No focal deficit present.     Mental Status: She is alert and oriented to person, place, and time.  Psychiatric:        Mood and Affect: Mood normal.        Thought Content: Thought content normal.      Results for orders placed or performed in visit on 03/22/22  POCT urinalysis dipstick  Result Value Ref Range   Color, UA yellow    Clarity, UA hazy    Glucose, UA Negative Negative   Bilirubin, UA Negative    Ketones, UA Negative    Spec Grav, UA     Blood, UA Negative    pH, UA 5.5 5.0 - 8.0   Protein, UA Negative Negative   Urobilinogen, UA 0.2 0.2 or 1.0 E.U./dL   Nitrite, UA Negative    Leukocytes, UA Negative Negative   Appearance     Odor    POCT urine pregnancy  Result Value Ref Range   Preg Test, Ur Negative Negative      03/22/2022    1:18 PM 12/07/2021    4:08 PM 08/28/2021   10:48 AM  Depression screen PHQ 2/9  Decreased Interest 1 0 0  Down, Depressed, Hopeless 1 0 0  PHQ - 2 Score 2 0 0  Altered sleeping '1 3 2  '$ Tired, decreased energy 1 0 3  Change in appetite 0 0 1  Feeling bad or failure about yourself  0 0 0  Trouble concentrating 0 0 0  Moving slowly  or fidgety/restless 0 0 0  Suicidal thoughts 0 0 0  PHQ-9 Score '4 3 6  '$ Difficult doing work/chores Not difficult at all Somewhat difficult       03/22/2022    1:19 PM 12/08/2020   11:08 AM 11/07/2020   10:07 AM 09/13/2019    1:18 PM  GAD 7 : Generalized Anxiety Score  Nervous, Anxious, on Edge '1 3 3 1  '$ Control/stop worrying '1 3 3 1  '$ Worry too much - different things '1 3 3 1  '$ Trouble relaxing 0 1 3 0  Restless 0 1 0 0  Easily annoyed or irritable '1 3 3 3  '$ Afraid - awful might happen '1 3 3 1  '$ Total GAD 7 Score '5 17 18 7  '$ Anxiety Difficulty Not difficult at all Very difficult Very difficult Somewhat difficult       Assessment & Plan:  Well adult exam -Anticipatory guidance given including wearing seatbelts, smoke detectors in the home, increasing physical activity, increasing p.o. intake of water and vegetables. -labs -pap done 04/14/21 -immunizations reviewed. -Colonoscopy and mammogram not yet indicated 2/2 age -Given handouts -Next CPE in 1 year  Irregular menses -likely 2/2 PCOS -continue metformin 1000 mg  -     CBC with Differential/Platelet -     Comprehensive metabolic panel -     TSH -     T4, free -     POCT urine pregnancy -     Ambulatory referral to Obstetrics / Gynecology  Type 2 diabetes mellitus with other specified complication, without long-term current use of insulin ([facility]) -Hemoglobin A1c 6.2% on 08/27/2021 -continue Metformin 1000 mg daily -pt to schedule eye exam for diabetic retinopathy screen -needs foot exam -     Comprehensive metabolic panel -     Lipid panel -     Hemoglobin A1c  Vitiligo -continue f/u with Derm -     Sedimentation rate -     Lupus (SLE) Analysis  Lower abdominal pain -     CBC with Differential/Platelet -     Comprehensive metabolic panel -     POCT urinalysis dipstick -     POCT urine pregnancy  Bilateral hip pain -continue f/u with Ortho -     Sedimentation rate -     Lupus (SLE) Analysis  PCOS (  polycystic  ovarian syndrome) -irregular menses noted. -continue metformin 1000 mg daily -     Ambulatory referral to Obstetrics / Gynecology   Return if symptoms worsen or fail to improve.   Billie Ruddy, MD

## 2022-03-22 NOTE — Patient Instructions (Signed)
A referral was placed to fertility clinic.  He should expect a phone call about scheduling this appointment.  Several labs were ordered to check on PCOS and weight.  An order was also placed for autoimmune disorder screening given her hip pain, vitiligo, rash on face.

## 2022-03-25 ENCOUNTER — Other Ambulatory Visit: Payer: Self-pay | Admitting: Family Medicine

## 2022-03-25 ENCOUNTER — Telehealth: Payer: Self-pay | Admitting: Family Medicine

## 2022-03-25 DIAGNOSIS — E282 Polycystic ovarian syndrome: Secondary | ICD-10-CM

## 2022-03-25 DIAGNOSIS — E1169 Type 2 diabetes mellitus with other specified complication: Secondary | ICD-10-CM

## 2022-03-25 MED ORDER — DEXCOM G7 SENSOR MISC: 1.0000 | 6 refills | Status: DC

## 2022-03-25 NOTE — Telephone Encounter (Signed)
Pt called to request the  [place] Sensor   [facility] asking for a new script   LOV:  03/22/22 = CPE  [id number] - [place], [place] - [address] Phone: [phone]  Fax: [phone]      * Pt has been without for 3 days

## 2022-03-25 NOTE — Telephone Encounter (Signed)
Rx sent 

## 2022-03-26 NOTE — Telephone Encounter (Signed)
Contacted the pharmacy and provide them with a clarification. No further action is needed.

## 2022-03-26 NOTE — Telephone Encounter (Signed)
Pt is calling and per pt pharm need clarification on dexcom

## 2022-03-28 LAB — LUPUS (SLE) ANALYSIS
Anti Nuclear Antibody (ANA): NEGATIVE
Anti-striation Abs: NEGATIVE
Complement C4, Serum: 31 mg/dL (ref 12–38)
ENA RNP Ab: 0.2 AI (ref 0.0–0.9)
ENA SM Ab Ser-aCnc: <0.2 AI (ref 0.0–0.9)
ENA SSA (RO) Ab: <0.2 AI (ref 0.0–0.9)
ENA SSB (LA) Ab: <0.2 AI (ref 0.0–0.9)
Mitochondrial Ab: <20 Units (ref 0.0–20.0)
Parietal Cell Ab: 104.2 Units — ABNORMAL HIGH (ref 0.0–20.0)
Scleroderma (Scl-70) (ENA) Antibody, IgG: <0.2 AI (ref 0.0–0.9)
Smooth Muscle Ab: 31 Units — ABNORMAL HIGH (ref 0–19)
Thyroperoxidase Ab SerPl-aCnc: 11 IU/mL (ref 0–34)
dsDNA Ab: 1 IU/mL (ref 0–9)

## 2022-03-30 NOTE — Telephone Encounter (Signed)
Already spoke to pharmacy and clarify with the  Dexcom.

## 2022-04-15 ENCOUNTER — Encounter: Payer: Self-pay | Admitting: Family Medicine

## 2022-05-31 DIAGNOSIS — E282 Polycystic ovarian syndrome: Secondary | ICD-10-CM | POA: Diagnosis not present

## 2022-05-31 DIAGNOSIS — E119 Type 2 diabetes mellitus without complications: Secondary | ICD-10-CM | POA: Diagnosis not present

## 2022-06-22 ENCOUNTER — Ambulatory Visit (INDEPENDENT_AMBULATORY_CARE_PROVIDER_SITE_OTHER): Payer: BC Managed Care – PPO | Admitting: Family

## 2022-06-22 ENCOUNTER — Encounter: Payer: Self-pay | Admitting: Family

## 2022-06-22 ENCOUNTER — Ambulatory Visit (INDEPENDENT_AMBULATORY_CARE_PROVIDER_SITE_OTHER): Payer: BC Managed Care – PPO

## 2022-06-22 VITALS — BP 108/68 | HR 80 | Temp 99.0°F | Ht 61.5 in | Wt 213.2 lb

## 2022-06-22 DIAGNOSIS — M25571 Pain in right ankle and joints of right foot: Secondary | ICD-10-CM

## 2022-06-22 NOTE — Progress Notes (Unsigned)
Acute Office Visit  Subjective:     Patient ID: Chloe Cervantes, female    DOB: 07/28/1990, 32 y.o.   MRN: [id number]  Chief Complaint  Patient presents with  . Ankle Pain    Patient complains of right ankle pain, x1 day     HPI Patient is in today with acute right ankle pain x 1 day. She denies any injury. Pain is throbbing and shooting, and worse with walking on hard surfaces. Better when walking on carpet.She has a history of sprains in the past. There is no swelling or redness.   Review of Systems  Constitutional: Negative.   Respiratory: Negative.    Cardiovascular: Negative.   Musculoskeletal:        Ankle pain  Neurological: Negative.   Endo/Heme/Allergies: Negative.   Psychiatric/Behavioral: Negative.    All other systems reviewed and are negative.  Past Medical History:  Diagnosis Date  . Abnormal uterine bleeding (AUB)   . Arthritis    HIPS  . Complication of anesthesia    hard to wake  . Depression   . Endometrial polyp   . GERD (gastroesophageal reflux disease)   . History of 2019 novel coronavirus disease (COVID-19) 01/2020   per pt mild symptoms that resolved  . History of gestational diabetes   . History of pregnancy induced hypertension   . IDA (iron deficiency anemia)   . PCOS (polycystic ovarian syndrome)   . Prediabetes   . Wears glasses     Social History   Socioeconomic History  . Marital status: Married    Spouse name: Not on file  . Number of children: Not on file  . Years of education: Not on file  . Highest education level: Associate degree: academic program  Occupational History  . Not on file  Tobacco Use  . Smoking status: Never  . Smokeless tobacco: Never  Vaping Use  . Vaping Use: Never used  Substance and Sexual Activity  . Alcohol use: Yes    Comment: Occasional  . Drug use: Never  . Sexual activity: Yes    Birth control/protection: Condom    Comment: 1st intercourse 32 yo-More than 5 partners  Other Topics  Concern  . Not on file  Social History Narrative  . Not on file   Social Determinants of Health   Financial Resource Strain: Low Risk  (04/16/2021)   Overall Financial Resource Strain (CARDIA)   . Difficulty of Paying Living Expenses: Not hard at all  Food Insecurity: No Food Insecurity (04/16/2021)   Hunger Vital Sign   . Worried About Programme researcher, broadcasting/film/video in the Last Year: Never true   . Ran Out of Food in the Last Year: Never true  Transportation Needs: No Transportation Needs (04/16/2021)   PRAPARE - Transportation   . Lack of Transportation (Medical): No   . Lack of Transportation (Non-Medical): No  Physical Activity: Unknown (04/16/2021)   Exercise Vital Sign   . Days of Exercise per Week: 3 days   . Minutes of Exercise per Session: Patient declined  Stress: Stress Concern Present (04/16/2021)   [employer] of Occupational Health - Occupational Stress Questionnaire   . Feeling of Stress : Rather much  Social Connections: Unknown (04/16/2021)   Social Connection and Isolation Panel [NHANES]   . Frequency of Communication with Friends and Family: More than three times a week   . Frequency of Social Gatherings with Friends and Family: Once a week   . Attends Religious Services:  Patient declined   . Active Member of Clubs or Organizations: No   . Attends Banker Meetings: Not on file   . Marital Status: Married  Catering manager Violence: Not on file    Past Surgical History:  Procedure Laterality Date  . HYSTEROSCOPY WITH D & C N/A 10/17/2018   Procedure: DILATION AND CURRETAGE WITH HYSTEROSCOPY;  Surgeon: Dara Lords, MD;  Location: [facility];  Service: Gynecology;  Laterality: N/A;  . HYSTEROSCOPY WITH D & C N/A 08/12/2020   Procedure: DILATATION AND CURETTAGE /HYSTEROSCOPY;  Surgeon: Romualdo Bolk, MD;  Location: [facility];  Service: Gynecology;  Laterality: N/A;  . LAPAROSCOPY Left 10/17/2018   Procedure:  LAPAROSCOPIC  REMOVAL OF PARATUBAL CYST;  Surgeon: Dara Lords, MD;  Location: [facility];  Service: Gynecology;  Laterality: Left;  . WISDOM TOOTH EXTRACTION     TEEN    Family History  Problem Relation Age of Onset  . Diabetes Mother   . Hypertension Mother   . Hyperlipidemia Mother     Allergies  Allergen Reactions  . Rhofade [Oxymetazoline Hcl] Rash    Current Outpatient Medications on File Prior to Visit  Medication Sig Dispense Refill  . blood glucose meter kit and supplies KIT Dispense based on patient and insurance preference. Use up to four times daily as directed. 1 each 0  . Blood Glucose Monitoring Suppl (ONE TOUCH ULTRA 2) w/Device KIT 1 each by Does not apply route daily. 1 kit 2  . Continuous Blood Gluc Sensor (DEXCOM G7 SENSOR) MISC 1 Device by Does not apply route every 21 ( twenty-one) days. 2 each 6  . glucose blood test strip 1 each by Other route daily. Use as instructed 100 each 12  . metFORMIN (GLUCOPHAGE) 500 MG tablet TAKE 1 TABLET BY MOUTH EVERY DAY WITH BREAKFAST (Patient taking differently: in the morning, at noon, in the evening, and at bedtime.) 90 tablet 1  . OneTouch Delica Lancets 30G MISC Use to check blood sugar up to 3 times daily. 100 each 3   No current facility-administered medications on file prior to visit.    BP 108/68 (BP Location: Left Arm, Patient Position: Sitting, Cuff Size: Normal)   Pulse 80   Temp 99 F (37.2 C) (Oral)   Ht 5' 1.5" (1.562 m)   Wt 213 lb 3.2 oz (96.7 kg)   SpO2 98%   BMI 39.63 kg/m chart     Objective:    BP 108/68 (BP Location: Left Arm, Patient Position: Sitting, Cuff Size: Normal)   Pulse 80   Temp 99 F (37.2 C) (Oral)   Ht 5' 1.5" (1.562 m)   Wt 213 lb 3.2 oz (96.7 kg)   SpO2 98%   BMI 39.63 kg/m    Physical Exam Vitals and nursing note reviewed.  Constitutional:      Appearance: She is normal weight.  Cardiovascular:     Rate and Rhythm: Normal rate and regular  rhythm.  Pulmonary:     Effort: Pulmonary effort is normal.     Breath sounds: Normal breath sounds.  Musculoskeletal:        General: Tenderness present. No swelling.     Cervical back: Normal range of motion and neck supple.     Right lower leg: No edema.     Left lower leg: No edema.     Comments: Tenderness to palpation at the medial aspect of the ankle. No swelling. No  redness.   Skin:    General: Skin is warm and dry.  Neurological:     General: No focal deficit present.     Mental Status: She is alert and oriented to person, place, and time. Mental status is at baseline.  Psychiatric:        Mood and Affect: Mood normal.        Behavior: Behavior normal.   No results found for any visits on 06/22/22.      Assessment & Plan:   Problem List Items Addressed This Visit   None Visit Diagnoses     Acute right ankle pain    -  Primary   Relevant Orders   DG Ankle Complete Right       Xray  No follow-ups on file.  Eulis Foster, FNP

## 2022-06-23 ENCOUNTER — Telehealth: Payer: Self-pay | Admitting: Family Medicine

## 2022-06-23 NOTE — Telephone Encounter (Signed)
Pt is calling and waiting on ankle xray results and pt does not want to make another and her ankle is no better and would like to know if she should wear brace

## 2022-06-23 NOTE — Telephone Encounter (Signed)
I spoke with Diane at at [facility] radiology and she stated she would mark the X-ray as urgent for review. Left message for patient to return my call

## 2022-06-23 NOTE — Telephone Encounter (Signed)
Left message for patient to return my call.

## 2022-06-23 NOTE — Telephone Encounter (Signed)
I spoke with the patient and she reported that ankle pain is getting worse and she is starting to notice pain more when driving and moving her foot. Patient stated she notices the pain more in big toe.

## 2022-06-23 NOTE — Telephone Encounter (Signed)
Patient was informed of message below.

## 2022-06-25 NOTE — Telephone Encounter (Signed)
Patient informed of the message below and voiced understanding  

## 2022-07-07 DIAGNOSIS — N979 Female infertility, unspecified: Secondary | ICD-10-CM | POA: Diagnosis not present

## 2022-08-03 DIAGNOSIS — Z6841 Body Mass Index (BMI) 40.0 and over, adult: Secondary | ICD-10-CM | POA: Diagnosis not present

## 2022-08-03 DIAGNOSIS — Z713 Dietary counseling and surveillance: Secondary | ICD-10-CM | POA: Diagnosis not present

## 2022-08-26 DIAGNOSIS — N979 Female infertility, unspecified: Secondary | ICD-10-CM | POA: Diagnosis not present

## 2022-10-20 ENCOUNTER — Ambulatory Visit (INDEPENDENT_AMBULATORY_CARE_PROVIDER_SITE_OTHER): Payer: BC Managed Care – PPO | Admitting: Family Medicine

## 2022-10-20 ENCOUNTER — Telehealth: Payer: Self-pay | Admitting: Family Medicine

## 2022-10-20 VITALS — BP 120/82 | HR 75 | Temp 98.1°F | Ht 61.5 in | Wt 225.8 lb

## 2022-10-20 DIAGNOSIS — E1169 Type 2 diabetes mellitus with other specified complication: Secondary | ICD-10-CM | POA: Diagnosis not present

## 2022-10-20 DIAGNOSIS — Z23 Encounter for immunization: Secondary | ICD-10-CM

## 2022-10-20 DIAGNOSIS — Z7985 Long-term (current) use of injectable non-insulin antidiabetic drugs: Secondary | ICD-10-CM

## 2022-10-20 DIAGNOSIS — Z6841 Body Mass Index (BMI) 40.0 and over, adult: Secondary | ICD-10-CM

## 2022-10-20 DIAGNOSIS — L80 Vitiligo: Secondary | ICD-10-CM

## 2022-10-20 DIAGNOSIS — R5383 Other fatigue: Secondary | ICD-10-CM

## 2022-10-20 MED ORDER — SEMAGLUTIDE(0.25 OR 0.5MG/DOS) 2 MG/3ML ~~LOC~~ SOPN: PEN_INJECTOR | SUBCUTANEOUS | 0 refills | Status: DC

## 2022-10-20 NOTE — Progress Notes (Signed)
Established Patient Office Visit   Subjective  Patient ID: Chloe Cervantes, female    DOB: May 14, 1990  Age: 32 y.o. MRN: [id number]  Chief Complaint  Patient presents with   Weight Loss  Patient accompanied by [name].  Patient is a 32 year old female seen for follow-up and ongoing concerns.  Patient states she is feeling increased fatigue.  Was unsure if it was due to her daughter sleepwalking which makes her more vigilant at night.  Patient states she can wake up feeling rested but then is tired as the day goes on.  In the past per patient was anemic and had similar symptoms.  Not currently taking iron.  Patient inquires about weight loss medications.  States need to be at or under 200 pounds in order to have surgery for hip pain.  Patient unable to exercise 2/2 hips.  Still tries to increase walking.    Past Medical History:  Diagnosis Date   Abnormal uterine bleeding (AUB)    Arthritis    HIPS   Complication of anesthesia    hard to wake   Depression    Endometrial polyp    GERD (gastroesophageal reflux disease)    History of 2019 novel coronavirus disease (COVID-19) 01/2020   per pt mild symptoms that resolved   History of gestational diabetes    History of pregnancy induced hypertension    IDA (iron deficiency anemia)    PCOS (polycystic ovarian syndrome)    Prediabetes    Wears glasses    Past Surgical History:  Procedure Laterality Date   HYSTEROSCOPY WITH D & C N/A 10/17/2018   Procedure: DILATION AND CURRETAGE WITH HYSTEROSCOPY;  Surgeon: Dara Lords, MD;  Location: [facility];  Service: Gynecology;  Laterality: N/A;   HYSTEROSCOPY WITH D & C N/A 08/12/2020   Procedure: DILATATION AND CURETTAGE /HYSTEROSCOPY;  Surgeon: Romualdo Bolk, MD;  Location: [facility] [place];  Service: Gynecology;  Laterality: N/A;   LAPAROSCOPY Left 10/17/2018   Procedure: LAPAROSCOPIC  REMOVAL OF PARATUBAL CYST;  Surgeon: Dara Lords, MD;  Location: [facility];  Service: Gynecology;  Laterality: Left;   WISDOM TOOTH EXTRACTION     TEEN   Social History   Tobacco Use   Smoking status: Never   Smokeless tobacco: Never  Vaping Use   Vaping status: Never Used  Substance Use Topics   Alcohol use: Yes    Comment: Occasional   Drug use: Never   Family History  Problem Relation Age of Onset   Diabetes Mother    Hypertension Mother    Hyperlipidemia Mother    No Active Allergies    ROS Negative unless stated above    Objective:     BP 120/82 (BP Location: Right Arm, Patient Position: Sitting, Cuff Size: Normal)   Pulse 75   Temp 98.1 F (36.7 C) (Oral)   Ht 5' 1.5" (1.562 m)   Wt 225 lb 12.8 oz (102.4 kg)   LMP  (LMP Unknown)   SpO2 94%   BMI 41.97 kg/m  BP Readings from Last 3 Encounters:  10/20/22 120/82  06/22/22 108/68  03/22/22 114/82   Wt Readings from Last 3 Encounters:  10/20/22 225 lb 12.8 oz (102.4 kg)  06/22/22 213 lb 3.2 oz (96.7 kg)  03/22/22 213 lb 12.8 oz (97 kg)      Physical Exam Constitutional:      General: She is not in acute distress.  Appearance: Normal appearance.  HENT:     Head: Normocephalic and atraumatic.     Nose: Nose normal.     Mouth/Throat:     Mouth: Mucous membranes are moist.  Cardiovascular:     Rate and Rhythm: Normal rate and regular rhythm.     Heart sounds: Normal heart sounds. No murmur heard.    No gallop.  Pulmonary:     Effort: Pulmonary effort is normal. No respiratory distress.     Breath sounds: Normal breath sounds. No wheezing, rhonchi or rales.  Skin:    General: Skin is warm and dry.     Comments: Hyperpigmentation of face and chest.  Neurological:     Mental Status: She is alert and oriented to person, place, and time.   Diabetic Foot Exam - Simple   Simple Foot Form Diabetic Foot exam was performed with the following findings: Yes 03/22/2022 10:41 AM  Visual Inspection No deformities, no  ulcerations, no other skin breakdown bilaterally: Yes Sensation Testing Intact to touch and monofilament testing bilaterally: Yes Pulse Check Posterior Tibialis and Dorsalis pulse intact bilaterally: Yes Comments      No results found for any visits on 10/20/22.    Assessment & Plan:  Type 2 diabetes mellitus with other specified complication, without long-term current use of insulin (HCC) -hgb A1c 6.5 on 05/31/22 -continue lifestyle modifications -start ozempic 0.25 mg wkly x 1 month, then increase to .0.5 mg wkly.  Discussed r/b/a -monitor bs -     Semaglutide(0.25 or 0.5MG /DOS); Inject 0.25 mg subcutaneous once a week for 4 weeks.  Increase dose to 0.50 mg weekly on week 5.  Dispense: 9 mL; Refill: 0  Flu vaccine need -     Flu vaccine trivalent PF, 6mos and older(Flulaval,Afluria,Fluarix,Fluzone)  Fatigue, unspecified type -sleep hygiene  Vitiligo -f/u with Derm  Class 3 severe Obesity with serious comorbidity and BMI 40.0-44.9, unspecified obesity type in adult -Body mass index is 41.97 kg/m. -continue lifestyle modifications -goal wt <200 lbs for hip surgery.  Return in about 3 months (around 01/19/2023).   Deeann Saint, MD

## 2022-10-20 NOTE — Patient Instructions (Addendum)
A prescription for Ozempic (semaglutide) was sent to your pharmacy.  When you first start the medication you are to take 0.25 mg dose once a week for the first month.  You can increase the dose to 0.5 mg once a week during the second month.  We would then have you follow-up in clinic to see how things are going in for the next refill.  Remember to check your blood sugar to make sure it is not dropping too low.  If you notice your blood sugar is becoming too low you can stop taking the metformin.

## 2022-10-20 NOTE — Telephone Encounter (Signed)
Pt is calling and was seen today the medication md sent to pharm is not covered however liraglutide is covered for 84 days  [id number] - [place], [place] - [address] Phone: [phone]  Fax: [phone]

## 2022-10-27 NOTE — Telephone Encounter (Signed)
Pt is calling in to check on the status of liraglutide and to see if that could be called into her pharmacy.

## 2022-10-28 ENCOUNTER — Encounter: Payer: Self-pay | Admitting: Family Medicine

## 2022-10-29 ENCOUNTER — Other Ambulatory Visit: Payer: Self-pay | Admitting: Family Medicine

## 2022-10-29 DIAGNOSIS — E66813 Obesity, class 3: Secondary | ICD-10-CM

## 2022-10-29 DIAGNOSIS — E1169 Type 2 diabetes mellitus with other specified complication: Secondary | ICD-10-CM

## 2022-10-29 MED ORDER — LIRAGLUTIDE 18 MG/3ML ~~LOC~~ SOPN
0.6000 mg | PEN_INJECTOR | Freq: Every day | SUBCUTANEOUS | 0 refills | Status: DC
Start: 2022-10-29 — End: 2023-03-02

## 2022-10-29 NOTE — Telephone Encounter (Signed)
sent 

## 2022-11-02 ENCOUNTER — Encounter: Payer: Self-pay | Admitting: Family Medicine

## 2022-11-08 ENCOUNTER — Encounter: Payer: Self-pay | Admitting: Family Medicine

## 2022-11-08 DIAGNOSIS — E1169 Type 2 diabetes mellitus with other specified complication: Secondary | ICD-10-CM

## 2022-11-08 MED ORDER — INSULIN PEN NEEDLE 32G X 4 MM MISC: 3 refills | Status: DC

## 2022-11-25 ENCOUNTER — Telehealth: Payer: Self-pay | Admitting: Family Medicine

## 2022-11-25 DIAGNOSIS — E282 Polycystic ovarian syndrome: Secondary | ICD-10-CM

## 2022-11-25 DIAGNOSIS — E1169 Type 2 diabetes mellitus with other specified complication: Secondary | ICD-10-CM

## 2022-11-25 MED ORDER — DEXCOM G7 SENSOR MISC
1.0000 | 6 refills | Status: DC
  Filled 2023-03-25: qty 2, 20d supply, fill #0

## 2022-11-25 NOTE — Telephone Encounter (Signed)
Prescription Request  11/25/2022  LOV: 10/20/2022  What is the name of the medication or equipment? Continuous Blood Gluc Sensor (DEXCOM G7 SENSOR) MIS   Have you contacted your pharmacy to request a refill? No   Which pharmacy would you like this sent to?  [id number] [place], [place] - [address] [place] [address] Phone: [phone] Fax: [phone]     Patient notified that their request is being sent to the clinical staff for review and that they should receive a response within 2 business days.   Please advise at Mobile [phone] (mobile)

## 2022-11-27 ENCOUNTER — Other Ambulatory Visit: Payer: Self-pay | Admitting: Family Medicine

## 2022-11-27 DIAGNOSIS — E119 Type 2 diabetes mellitus without complications: Secondary | ICD-10-CM

## 2022-11-27 DIAGNOSIS — E282 Polycystic ovarian syndrome: Secondary | ICD-10-CM

## 2023-02-04 ENCOUNTER — Encounter: Payer: Self-pay | Admitting: Family Medicine

## 2023-02-04 NOTE — Telephone Encounter (Signed)
 Care team updated and letter sent for eye exam notes.

## 2023-02-05 DIAGNOSIS — E119 Type 2 diabetes mellitus without complications: Secondary | ICD-10-CM | POA: Diagnosis not present

## 2023-03-02 ENCOUNTER — Other Ambulatory Visit: Payer: Self-pay | Admitting: Family Medicine

## 2023-03-02 DIAGNOSIS — E1169 Type 2 diabetes mellitus with other specified complication: Secondary | ICD-10-CM

## 2023-03-16 ENCOUNTER — Ambulatory Visit (INDEPENDENT_AMBULATORY_CARE_PROVIDER_SITE_OTHER): Payer: BC Managed Care – PPO | Admitting: Internal Medicine

## 2023-03-16 ENCOUNTER — Encounter: Payer: Self-pay | Admitting: Internal Medicine

## 2023-03-16 ENCOUNTER — Ambulatory Visit: Payer: Self-pay | Admitting: Family Medicine

## 2023-03-16 VITALS — BP 122/70 | HR 76 | Temp 98.1°F | Wt 228.1 lb

## 2023-03-16 DIAGNOSIS — Z794 Long term (current) use of insulin: Secondary | ICD-10-CM | POA: Diagnosis not present

## 2023-03-16 DIAGNOSIS — E1165 Type 2 diabetes mellitus with hyperglycemia: Secondary | ICD-10-CM

## 2023-03-16 DIAGNOSIS — E1169 Type 2 diabetes mellitus with other specified complication: Secondary | ICD-10-CM

## 2023-03-16 LAB — POCT GLYCOSYLATED HEMOGLOBIN (HGB A1C): Hemoglobin A1C: 7.2 % — AB (ref 4.0–5.6)

## 2023-03-16 MED ORDER — GLUCOSE BLOOD VI STRP: 1.0000 | ORAL_STRIP | Freq: Every day | 12 refills | Status: AC

## 2023-03-16 MED ORDER — LANTUS SOLOSTAR 100 UNIT/ML ~~LOC~~ SOPN
5.0000 [IU] | PEN_INJECTOR | Freq: Every day | SUBCUTANEOUS | 2 refills | Status: DC
Start: 2023-03-16 — End: 2023-03-29

## 2023-03-16 NOTE — Addendum Note (Signed)
Addended by: Kern Reap B on: 03/16/2023 04:35 PM   Modules accepted: Orders

## 2023-03-16 NOTE — Telephone Encounter (Addendum)
Copied from CRM [id number]. Topic: Clinical - Red Word Triage >> Mar 16, 2023 10:47 AM Marica Otter wrote: Red Word that prompted transfer to Nurse Triage: Patient states her blood sugar is high consistently everyday, Monday 306, Tuesday 205  and 200-250 today   Chief Complaint:Patient reports she has Type I Diabetes. Patient reports increased blood glucose sugars since Monday.  The Blood sugars measured this week are Monday 306, Tuesday 205  and 200-250 today.She is concerned about this happening consistently and not being on any insulin. Symptoms: Increase thirstiness. Denies any other symptoms. Frequency: x 3 days Pertinent Negatives: Patient denies fever nor any other symptoms Disposition: [] ED /[] Urgent Care (no appt availability in office) / [x] Appointment(In office/virtual)/ []  [facility]/ [] Home Care/ [] Refused Recommended Disposition /[] [place] [facility]/ []  Follow-up with PCP Additional Notes:  Provided recommendations. Patient verbalized understanding. Patient scheduled for today. Answer Assessment - Initial Assessment Questions 1. BLOOD GLUCOSE: "What is your blood glucose level?"      Patient states her blood sugar is high consistently everyday, Monday 306, Tuesday 205  and 200-250 today  2. ONSET: "When did you check the blood glucose?"      Dexcom 3. USUAL RANGE: "What is your glucose level usually?" (e.g., usual fasting morning value, usual evening value)  Average 165- consistently  120-130      4. KETONES: "Do you check for ketones (urine or blood test strips)?" If Yes, ask: "What does the test show now?"       Denies -  be able to check 5. TYPE 1 or 2:  "Do you know what type of diabetes you have?"  (e.g., Type 1, Type 2, Gestational; doesn't know)       Type 1 6. INSULIN: "Do you take insulin?" "What type of insulin(s) do you use? What is the mode of delivery? (syringe, pen; injection or pump)?"       Denies - not prescribe 7. DIABETES PILLS: "Do you take any pills for your diabetes?" If Yes, ask: "Have you missed taking any pills recently?"     Metformin 1000 at bed time   Liragluptide 8. OTHER SYMPTOMS: "Do you have any symptoms?" (e.g., fever, frequent urination, difficulty breathing, dizziness, weakness, vomiting)    A little dizziness but is resolving as blood sugar comes down. 9. PREGNANCY: "Is there any chance you are pregnant?" "When was your last menstrual period?"     Denies - last one 3 months- PCOS  Protocols used: Diabetes - High Blood Sugar-A-AH

## 2023-03-16 NOTE — Progress Notes (Signed)
Established Patient Office Visit     CC/Reason for Visit: Elevated blood glucose  HPI: Chloe Cervantes is a 33 y.o. female who is coming in today for the above mentioned reasons.  She was diagnosed with gestational diabetes that progressed to what was believed to be type 2 diabetes.  She was prescribed semaglutide however her insurance did not cover it and was transition over to liraglutide and metformin.  She had not been wearing her Dexcom monitor for some time, placed it this week and her CBGs have been high in the 300 range and above 250 range.  Interestingly her A1c is 7.2.  She has been describing polydipsia and polyuria.  Interestingly she has vitiligo which is considered an autoimmune disease.   Past Medical/Surgical History: Past Medical History:  Diagnosis Date   Abnormal uterine bleeding (AUB)    Arthritis    HIPS   Complication of anesthesia    hard to wake   Depression    Endometrial polyp    GERD (gastroesophageal reflux disease)    History of 2019 novel coronavirus disease (COVID-19) 01/2020   per pt mild symptoms that resolved   History of gestational diabetes    History of pregnancy induced hypertension    IDA (iron deficiency anemia)    PCOS (polycystic ovarian syndrome)    Prediabetes    Wears glasses     Past Surgical History:  Procedure Laterality Date   HYSTEROSCOPY WITH D & C N/A 10/17/2018   Procedure: DILATION AND CURRETAGE WITH HYSTEROSCOPY;  Surgeon: Dara Lords, MD;  Location: [facility];  Service: Gynecology;  Laterality: N/A;   HYSTEROSCOPY WITH D & C N/A 08/12/2020   Procedure: DILATATION AND CURETTAGE /HYSTEROSCOPY;  Surgeon: Romualdo Bolk, MD;  Location: [facility] [place];  Service: Gynecology;  Laterality: N/A;   LAPAROSCOPY Left 10/17/2018   Procedure: LAPAROSCOPIC  REMOVAL OF PARATUBAL CYST;  Surgeon: Dara Lords, MD;  Location: [facility];  Service: Gynecology;   Laterality: Left;   WISDOM TOOTH EXTRACTION     TEEN    Social History:  reports that she has never smoked. She has never used smokeless tobacco. She reports current alcohol use. She reports that she does not use drugs.  Allergies: No Active Allergies  Family History:  Family History  Problem Relation Age of Onset   Diabetes Mother    Hypertension Mother    Hyperlipidemia Mother      Current Outpatient Medications:    blood glucose meter kit and supplies KIT, Dispense based on patient and insurance preference. Use up to four times daily as directed., Disp: 1 each, Rfl: 0   Blood Glucose Monitoring Suppl (ONE TOUCH ULTRA 2) w/Device KIT, 1 each by Does not apply route daily., Disp: 1 kit, Rfl: 2   Continuous Glucose Sensor (DEXCOM G7 SENSOR) MISC, 1 Device by Does not apply route every 21 ( twenty-one) days., Disp: 2 each, Rfl: 6   glucose blood test strip, 1 each by Other route daily. Use as instructed, Disp: 100 each, Rfl: 12   insulin glargine (LANTUS SOLOSTAR) 100 UNIT/ML Solostar Pen, Inject 5 Units into the skin at bedtime., Disp: 3 mL, Rfl: 2   Insulin Pen Needle 32G X 4 MM MISC, To use with Victoza Daily, Disp: 30 each, Rfl: 3   liraglutide (VICTOZA) 18 MG/3ML SOPN, INJECT 0.6 MG UNDER THE SKIN ONCE DAILY, Disp: 3 mL, Rfl: 2   metFORMIN (GLUCOPHAGE) 500 MG  tablet, TAKE 1 TABLET BY MOUTH EVERY DAY WITH BREAKFAST (Patient taking differently: in the morning, at noon, in the evening, and at bedtime.), Disp: 90 tablet, Rfl: 1   OneTouch Delica Lancets 30G MISC, Use to check blood sugar up to 3 times daily., Disp: 100 each, Rfl: 3  Review of Systems:  Negative unless indicated in HPI.   Physical Exam: Vitals:   03/16/23 1558  BP: 122/70  Pulse: 76  Temp: 98.1 F (36.7 C)  TempSrc: Oral  SpO2: 100%  Weight: 228 lb 1.6 oz (103.5 kg)    Body mass index is 42.4 kg/m.   Physical Exam Vitals reviewed.  Constitutional:      Appearance: Normal appearance.  HENT:      Head: Normocephalic and atraumatic.  Eyes:     Conjunctiva/sclera: Conjunctivae normal.  Skin:    General: Skin is warm and dry.  Neurological:     General: No focal deficit present.     Mental Status: She is alert and oriented to person, place, and time.  Psychiatric:        Mood and Affect: Mood normal.        Behavior: Behavior normal.        Thought Content: Thought content normal.        Judgment: Judgment normal.      Impression and Plan:  Type 2 diabetes mellitus with other specified complication, without long-term current use of insulin (HCC) -     POCT glycosylated hemoglobin (Hb A1C)  Type 2 diabetes mellitus with hyperglycemia, with long-term current use of insulin (HCC) -     Lantus SoloStar; Inject 5 Units into the skin at bedtime.  Dispense: 3 mL; Refill: 2 -     Ambulatory referral to Endocrinology   -She has other autoimmune diseases like vitiligo.  Probably depleting her insulin stores at this point.  Will start Lantus 5 units at bedtime.  Advise close follow-up with PCP and will also place referral for endocrinology.  She has a Dexcom monitor and will update [place] with results.  Time spent:30 minutes reviewing chart, interviewing and examining patient and formulating plan of care.     Chaya Jan, MD [facility]

## 2023-03-16 NOTE — Telephone Encounter (Deleted)
Reason for Disposition . [1] Symptoms of high blood sugar (e.g., abnormally thirsty, frequent urination, weight loss) AND [2] not able to test blood glucose  Protocols used: Diabetes - High Blood Sugar-A-AH

## 2023-03-21 ENCOUNTER — Other Ambulatory Visit: Payer: Self-pay

## 2023-03-21 ENCOUNTER — Ambulatory Visit: Payer: Self-pay | Admitting: Family Medicine

## 2023-03-21 ENCOUNTER — Emergency Department (HOSPITAL_BASED_OUTPATIENT_CLINIC_OR_DEPARTMENT_OTHER)
Admission: EM | Admit: 2023-03-21 | Discharge: 2023-03-21 | Disposition: A | Discharge status: Home / Self Care | Payer: BC Managed Care – PPO | Attending: Emergency Medicine | Admitting: Emergency Medicine

## 2023-03-21 DIAGNOSIS — R519 Headache, unspecified: Secondary | ICD-10-CM | POA: Diagnosis not present

## 2023-03-21 DIAGNOSIS — R739 Hyperglycemia, unspecified: Secondary | ICD-10-CM | POA: Diagnosis not present

## 2023-03-21 DIAGNOSIS — Z79899 Other long term (current) drug therapy: Secondary | ICD-10-CM | POA: Insufficient documentation

## 2023-03-21 DIAGNOSIS — E1165 Type 2 diabetes mellitus with hyperglycemia: Secondary | ICD-10-CM | POA: Diagnosis not present

## 2023-03-21 DIAGNOSIS — Z794 Long term (current) use of insulin: Secondary | ICD-10-CM | POA: Insufficient documentation

## 2023-03-21 DIAGNOSIS — Z7984 Long term (current) use of oral hypoglycemic drugs: Secondary | ICD-10-CM | POA: Diagnosis not present

## 2023-03-21 LAB — CBC
HCT: 39.6 % (ref 36.0–46.0)
Hemoglobin: 12.7 g/dL (ref 12.0–15.0)
MCH: 25.8 pg — ABNORMAL LOW (ref 26.0–34.0)
MCHC: 32.1 g/dL (ref 30.0–36.0)
MCV: 80.3 fL (ref 80.0–100.0)
Platelets: 428 10*3/uL — ABNORMAL HIGH (ref 150–400)
RBC: 4.93 MIL/uL (ref 3.87–5.11)
RDW: 14.7 % (ref 11.5–15.5)
WBC: 9.3 10*3/uL (ref 4.0–10.5)
nRBC: 0 % (ref 0.0–0.2)

## 2023-03-21 LAB — CBG MONITORING, ED: Glucose-Capillary: 105 mg/dL — ABNORMAL HIGH (ref 70–99)

## 2023-03-21 LAB — URINALYSIS, ROUTINE W REFLEX MICROSCOPIC
Bilirubin Urine: NEGATIVE
Glucose, UA: NEGATIVE mg/dL
Hgb urine dipstick: NEGATIVE
Ketones, ur: NEGATIVE mg/dL
Leukocytes,Ua: NEGATIVE
Nitrite: NEGATIVE
Protein, ur: NEGATIVE mg/dL
Specific Gravity, Urine: 1.014 (ref 1.005–1.030)
pH: 6.5 (ref 5.0–8.0)

## 2023-03-21 LAB — PREGNANCY, URINE: Preg Test, Ur: NEGATIVE

## 2023-03-21 LAB — BASIC METABOLIC PANEL
Anion gap: 8 (ref 5–15)
BUN: 11 mg/dL (ref 6–20)
CO2: 29 mmol/L (ref 22–32)
Calcium: 9.3 mg/dL (ref 8.9–10.3)
Chloride: 100 mmol/L (ref 98–111)
Creatinine, Ser: 0.52 mg/dL (ref 0.44–1.00)
GFR, Estimated: >60 mL/min (ref >60)
Glucose, Bld: 101 mg/dL — ABNORMAL HIGH (ref 70–99)
Potassium: 3.7 mmol/L (ref 3.5–5.1)
Sodium: 137 mmol/L (ref 135–145)

## 2023-03-21 NOTE — ED Triage Notes (Signed)
Pt c/o hyperglycemia, "all the way up to 251." States she was recently put on long-acting insulin- started Wednesday night, today is the first day it's been high w the insulin." Lightheaded, nauseous. Advises she is "still a little nauseous, but somewhat better."  Hx DM2, compliant w home meds

## 2023-03-21 NOTE — Telephone Encounter (Signed)
 Patient is being seen in the ED

## 2023-03-21 NOTE — ED Provider Notes (Signed)
[name] EMERGENCY DEPARTMENT AT [facility] Provider Note   CSN: [id number] Arrival date & time: 03/21/23  1046     History  Chief Complaint  Patient presents with   Hyperglycemia    Chloe Cervantes is a 33 y.o. female.  33 yo F with a cc of headaches and hyperglycemia.  The patient states for about 3 weeks off and on she has been having episodes where she develops a headache and when she checks her blood sugar is also elevated.  She thinks the blood sugar is making her have a headache.  She is here family doctor for this and started taking Lantus.  She has gone up on her dose but had a fasting blood sugar today of 250 she had a headache and was having trouble identifying her husband and they called the PCP and they encouraged her to come here for evaluation.  She had some to eat and her blood sugar got better and now she no longer has a headache.  She denies chest pain cough fever or difficulty breathing.  Denied one-sided numbness or weakness or difficulty speech or swallowing.   Hyperglycemia      Home Medications Prior to Admission medications   Medication Sig Start Date End Date Taking? Authorizing Provider  blood glucose meter kit and supplies KIT Dispense based on patient and insurance preference. Use up to four times daily as directed. 01/04/22   Deeann Saint, MD  Blood Glucose Monitoring Suppl (ONE TOUCH ULTRA 2) w/Device KIT 1 each by Does not apply route daily. 12/09/21   Deeann Saint, MD  Continuous Glucose Sensor (DEXCOM G7 SENSOR) MISC 1 Device by Does not apply route every 21 ( twenty-one) days. 11/25/22   Deeann Saint, MD  glucose blood test strip 1 each by Other route daily. One Touch Ultra 03/16/23   Philip Aspen, Limmie Patricia, MD  insulin glargine (LANTUS SOLOSTAR) 100 UNIT/ML Solostar Pen Inject 5 Units into the skin at bedtime. 03/16/23   Philip Aspen, Limmie Patricia, MD  Insulin Pen Needle 32G X 4 MM MISC To use with Victoza Daily  11/08/22   Deeann Saint, MD  liraglutide (VICTOZA) 18 MG/3ML SOPN INJECT 0.6 MG UNDER THE SKIN ONCE DAILY 03/02/23   Deeann Saint, MD  metFORMIN (GLUCOPHAGE) 500 MG tablet TAKE 1 TABLET BY MOUTH EVERY DAY WITH BREAKFAST Patient taking differently: in the morning, at noon, in the evening, and at bedtime. 11/23/21   Deeann Saint, MD  OneTouch Delica Lancets 30G MISC Use to check blood sugar up to 3 times daily. 01/04/22   Deeann Saint, MD      Allergies    Patient has no active allergies.    Review of Systems   Review of Systems  Physical Exam Updated Vital Signs BP (!) 146/97   Pulse 88   Temp 97.6 F (36.4 C)   Resp 16   SpO2 98%  Physical Exam Vitals and nursing note reviewed.  Constitutional:      General: She is not in acute distress.    Appearance: She is well-developed. She is not diaphoretic.  HENT:     Head: Normocephalic and atraumatic.  Eyes:     Pupils: Pupils are equal, round, and reactive to light.  Cardiovascular:     Rate and Rhythm: Normal rate and regular rhythm.     Heart sounds: No murmur heard.    No friction rub. No gallop.  Pulmonary:  Effort: Pulmonary effort is normal.     Breath sounds: No wheezing or rales.  Abdominal:     General: There is no distension.     Palpations: Abdomen is soft.     Tenderness: There is no abdominal tenderness.  Musculoskeletal:        General: No tenderness.     Cervical back: Normal range of motion and neck supple.  Skin:    General: Skin is warm and dry.  Neurological:     Mental Status: She is alert and oriented to person, place, and time.     GCS: GCS eye subscore is 4. GCS verbal subscore is 5. GCS motor subscore is 6.     Cranial Nerves: Cranial nerves 2-12 are intact.     Sensory: Sensation is intact.     Motor: Motor function is intact.     Coordination: Coordination is intact.     Comments: Benign neurologic exam  Psychiatric:        Behavior: Behavior normal.     ED Results /  Procedures / Treatments   Labs (all labs ordered are listed, but only abnormal results are displayed) Labs Reviewed  BASIC METABOLIC PANEL - Abnormal; Notable for the following components:      Result Value   Glucose, Bld 101 (*)    All other components within normal limits  CBC - Abnormal; Notable for the following components:   MCH 25.8 (*)    Platelets 428 (*)    All other components within normal limits  CBG MONITORING, ED - Abnormal; Notable for the following components:   Glucose-Capillary 105 (*)    All other components within normal limits  URINALYSIS, ROUTINE W REFLEX MICROSCOPIC  PREGNANCY, URINE    EKG None  Radiology No results found.  Procedures Procedures    Medications Ordered in ED Medications - No data to display  ED Course/ Medical Decision Making/ A&P                                 Medical Decision Making Amount and/or Complexity of Data Reviewed Labs: ordered.   33 yo F with a chief complaints of episodes that she has a headache and hyperglycemia.  She has been getting headaches off and on for about 3 weeks.  Over time she has when she checks her blood sugar notes it is elevated.  She has been using a Dexcom.  Before that she had episodes where she felt lightheaded but did not check her sugar.  She thinks it was probably elevated then.  She has been working on this problem with her PCP and is started on Lantus.  Has been going up 3 units every time her blood sugar is elevated.  She noted that her blood sugar was 250 this morning.  Had a headache at the same time.  She had some the eat and now feels a bit better.  Blood work here is unremarkable, metabolic panel without metabolic acidosis or anion gap.  Blood sugar was 101.  No leukocytosis no acute anemia pregnancy test was negative.  UA negative for infection.  I discussed results with the patient.  Encouraged her to follow-up with her family doctor in the office.  With new headaches will refer her to  a neurologist.  3:28 PM:  I have discussed the diagnosis/risks/treatment options with the patient.  Evaluation and diagnostic testing in the emergency department does not suggest  an emergent condition requiring admission or immediate intervention beyond what has been performed at this time.  They will follow up with PCP, neuro. We also discussed returning to the ED immediately if new or worsening sx occur. We discussed the sx which are most concerning (e.g., sudden worsening pain, fever, inability to tolerate by mouth, stroke s/sx) that necessitate immediate return. Medications administered to the patient during their visit and any new prescriptions provided to the patient are listed below.  Medications given during this visit Medications - No data to display   The patient appears reasonably screen and/or stabilized for discharge and I doubt any other medical condition or other [facility] requiring further screening, evaluation, or treatment in the ED at this time prior to discharge.          Final Clinical Impression(s) / ED Diagnoses Final diagnoses:  Hyperglycemia  Intermittent headache    Rx / DC Orders ED Discharge Orders          Ordered    Ambulatory referral to Neurology       Comments: New headache syndrome?   03/21/23 1524              Melene Plan, DO 03/21/23 1528

## 2023-03-21 NOTE — Telephone Encounter (Signed)
Chief Complaint: hyperglycemia Symptoms: hyperglycemia, nausea this AM, dizziness Frequency: this morning Pertinent Negatives: Patient denies SOB, CP, nausea/vomiting currently, frequent urination Disposition: [] ED /[] Urgent Care (no appt availability in office) / [] Appointment(In office/virtual)/ []  [facility]/ [] Home Care/ [] Refused Recommended Disposition /[]  Mobile Bus/ []  Follow-up with PCP Additional Notes: Pt calls from the ED at [facility]. Pt reports concerns of hyperglycemia. Pt states her fasting BG this AM was 251. Pt states she started Lantus on Wednesday and is currently taking metformin and liraglutide. Pt states she felt nauseous this AM but never vomited. States her nausea has resolved. Endorses dizziness. Pt's BG is now 96. Pt calling to see if she could scheduled a same-day appt with Dr. Salomon Fick. Dr. Salomon Fick has no availability today. No availability at any [facility] office for today. Pt states she will remain at the ED and wait to be seen. RN advised pt to call back or return to the hospital if she does leave the ED and worsens. Pt verbalized understanding.   Copied from CRM [id number]. Topic: Clinical - Red Word Triage >> Mar 21, 2023  2:49 PM Florestine Avers wrote: Red Word that prompted transfer to Nurse Triage: Patient having issues with her diabetes. Patient currently not having symptoms but is requesting same day appointment. Reason for Disposition  Blood glucose 70-240 mg/dL (3.9 -91.4 mmol/L)  Answer Assessment - Initial Assessment Questions 1. BLOOD GLUCOSE: "What is your blood glucose level?"      251 BG (fasting) this AM - started Lantus on Wednesday  2. ONSET: "When did you check the blood glucose?"     251 this AM, 96 currently  3. USUAL RANGE: "What is your glucose level usually?" (e.g., usual fasting morning value, usual evening value)     120-130 5. TYPE 1 or 2:  "Do you know what type of diabetes you have?"  (e.g., Type 1, Type 2, Gestational; doesn't  know)      Type 2 6. INSULIN: "Do you take insulin?" "What type of insulin(s) do you use? What is the mode of delivery? (syringe, pen; injection or pump)?"      Metformin, Victoza, Lantus 7. DIABETES PILLS: "Do you take any pills for your diabetes?" If Yes, ask: "Have you missed taking any pills recently?"     Metformin and Victoza 8. OTHER SYMPTOMS: "Do you have any symptoms?" (e.g., fever, frequent urination, difficulty breathing, dizziness, weakness, vomiting)     Dizziness currently, pt states she was nauseous this AM  Protocols used: Diabetes - High Blood Sugar-A-AH

## 2023-03-21 NOTE — Discharge Instructions (Signed)
I am not sure the cause of your headaches.  I am not sure that your blood sugar and your headaches are related.  Please follow-up with your family doctor in the office.   I have placed a referral for the neurology office this set up an appointment with you.  As we had discussed please return for one-sided numbness or weakness difficulty speech or swallowing.

## 2023-03-21 NOTE — Telephone Encounter (Signed)
  Chief Complaint: Elevated BG Symptoms: dizziness, nausea Frequency: Ongoing since Wednesday, worsening Pertinent Negatives: Patient denies missed medication doses Disposition: [x] ED /[] Urgent Care (no appt availability in office) / [] Appointment(In office/virtual)/ []  [facility]/ [] Home Care/ [] Refused Recommended Disposition /[] [facility]/ []  Follow-up with PCP Additional Notes: Pt reports she has been experiencing elevated BG readings and worsening symptoms associated with those readings since this past Wednesday. Pt notes her BG reading this AM was 241, follow up reading was 251 at the time of the call. This RN unable to fully triage pt as she was also speaking to her provider. Pt reports dizziness, nausea and "feeling like I'm going to pass out" as worsening since Wednesday. Pt notes she continues to feel worse and notes her BG readings have continued to steadily rise. Pt notes she started 5 units daily of a new long acting insulin and she has been taking this as prescribed. Pt to proceed to ED per protocol. This RN educated pt on home care, new-worsening symptoms, when to call back/seek emergent care. Pt verbalized understanding and agrees to plan.    Reason for Disposition  Patient sounds very sick or weak to the triager  Answer Assessment - Initial Assessment Questions 1. BLOOD GLUCOSE: "What is your blood glucose level?"      251 2. ONSET: "When did you check the blood glucose?"     Pt reports increasing BG readings since Wednesday  5. TYPE 1 or 2:  "Do you know what type of diabetes you have?"  (e.g., Type 1, Type 2, Gestational; doesn't know)      Type 2 6. INSULIN: "Do you take insulin?" "What type of insulin(s) do you use? What is the mode of delivery? (syringe, pen; injection or pump)?"      5 units, long acting insulin  8. OTHER SYMPTOMS: "Do you have any symptoms?" (e.g., fever, frequent urination, difficulty breathing, dizziness, weakness,  vomiting)     Dizzy, nauseous, feels like she is going to pass out, worsening  Protocols used: Diabetes - High Blood Sugar-A-AH

## 2023-03-22 NOTE — Progress Notes (Signed)
 ACUTE VISIT Chief Complaint  Patient presents with   Blood Sugar Problem    Started about last Wednesday, was started on Lantus . Having BS readings as high as high 200s. When BS goes up, feeling light headed and not feeling well. Pt has not been able to eat much due to concern of BS rising, in turn her stomach has started to bother her as well.    HPI: Chloe Cervantes is a 33 y.o. female with a PMHx significant for hepatic steatosis, DM II, PCOS, vitamin D  deficiency, iron deficiency anemia, vitiligo, anxiety, and depression, among some, who is here today with her husband complaining of episodes or headache and lightsomeness , which she believes are caused by elevated BS's as described above.  She is having dizziness, parietal pressure headache, fatigue, periumbilical abdominal pain, and nausea after eating.  She has some blurry vision with the dizziness, which she describes as a spinning sensation. She does not have the dizziness in bed. No associated hearing change or recent URI.   She says her blood sugars are over 180 right after she eats anything, and have gotten as high as 250. She believes all of her symptoms are related to her blood sugar, states that she starts getting symptoms when her sugars get over 200. As a result, she doesn't want to eat.  Has not had episodes of hypoglycemia.  Periumbilical pain since yesterday. She also mentions she has had some constipation. Her bowel movements are hard, and she usually has them every day. She has to strain.   She was seen in the ED on 2/3 for hyperglycemia and headache. She had a CBC, urinalysis, and pregnancy test.  Lab Results  Component Value Date   NA 137 03/21/2023   CL 100 03/21/2023   K 3.7 03/21/2023   CO2 29 03/21/2023   BUN 11 03/21/2023   CREATININE 0.52 03/21/2023   GFRNONAA >60 03/21/2023   CALCIUM 9.3 03/21/2023   ALBUMIN 4.7 03/22/2022   GLUCOSE 101 (H) 03/21/2023   Lab Results  Component Value Date    WBC 9.3 03/21/2023   HGB 12.7 03/21/2023   HCT 39.6 03/21/2023   MCV 80.3 03/21/2023   PLT 428 (H) 03/21/2023   Currently, she is taking Lantus  5 units at bedtime, which she is planning to increase to 8 units today.  Also on Victosa 0.6 mg injection daily and Metformin  500 mg 4x daily.   Lab Results  Component Value Date   HGBA1C 7.2 (A) 03/16/2023   She has been referred to endocrinology, but has not seen them yet. Has not seen a nutritionist.   Her mom had DM II. She is unsure of any other FMHx of DM II or DM I.   LMP: 4-5 months ago. She says she doesn't have them regularly because she has PCOS.   Review of Systems  Constitutional:  Positive for fatigue. Negative for appetite change and fever.  HENT:  Negative for ear discharge, ear pain, facial swelling, mouth sores, nosebleeds and sore throat.   Eyes:  Negative for discharge and redness.  Respiratory:  Negative for shortness of breath.   Cardiovascular:  Negative for chest pain and leg swelling.  Gastrointestinal:  Negative for abdominal distention and vomiting.  Endocrine: Negative for cold intolerance, heat intolerance, polydipsia, polyphagia and polyuria.  Genitourinary:  Negative for decreased urine volume, dysuria and hematuria.  Musculoskeletal:  Negative for gait problem and myalgias.  Neurological:  Negative for syncope, facial asymmetry and weakness.  Psychiatric/Behavioral:  Negative for confusion and hallucinations. The patient is nervous/anxious.   See other pertinent positives and negatives in HPI.  Current Outpatient Medications on File Prior to Visit  Medication Sig Dispense Refill   blood glucose meter kit and supplies KIT Dispense based on patient and insurance preference. Use up to four times daily as directed. 1 each 0   Blood Glucose Monitoring Suppl (ONE TOUCH ULTRA 2) w/Device KIT 1 each by Does not apply route daily. 1 kit 2   Continuous Glucose Sensor (DEXCOM G7 SENSOR) MISC 1 Device by Does not  apply route every 21 ( twenty-one) days. 2 each 6   glucose blood test strip 1 each by Other route daily. One Touch Ultra 100 each 12   insulin  glargine (LANTUS  SOLOSTAR) 100 UNIT/ML Solostar Pen Inject 5 Units into the skin at bedtime. 3 mL 2   Insulin  Pen Needle 32G X 4 MM MISC To use with Victoza  Daily 30 each 3   liraglutide  (VICTOZA ) 18 MG/3ML SOPN INJECT 0.6 MG UNDER THE SKIN ONCE DAILY 3 mL 2   metFORMIN  (GLUCOPHAGE ) 500 MG tablet TAKE 1 TABLET BY MOUTH EVERY DAY WITH BREAKFAST (Patient taking differently: in the morning, at noon, in the evening, and at bedtime.) 90 tablet 1   OneTouch Delica Lancets 30G MISC Use to check blood sugar up to 3 times daily. 100 each 3   No current facility-administered medications on file prior to visit.    Past Medical History:  Diagnosis Date   Abnormal uterine bleeding (AUB)    Arthritis    HIPS   Complication of anesthesia    hard to wake   Depression    Endometrial polyp    GERD (gastroesophageal reflux disease)    History of 2019 novel coronavirus disease (COVID-19) 01/2020   per pt mild symptoms that resolved   History of gestational diabetes    History of pregnancy induced hypertension    IDA (iron deficiency anemia)    PCOS (polycystic ovarian syndrome)    Prediabetes    Wears glasses    No Active Allergies  Social History   Socioeconomic History   Marital status: Married    Spouse name: Not on file   Number of children: Not on file   Years of education: Not on file   Highest education level: Associate degree: occupational, scientist, product/process development, or vocational program  Occupational History   Not on file  Tobacco Use   Smoking status: Never   Smokeless tobacco: Never  Vaping Use   Vaping status: Never Used  Substance and Sexual Activity   Alcohol use: Yes    Comment: Occasional   Drug use: Never   Sexual activity: Yes    Birth control/protection: Condom    Comment: 1st intercourse 33 yo-More than 5 partners  Other Topics Concern    Not on file  Social History Narrative   Not on file   Social Drivers of Health   Financial Resource Strain: Low Risk  (03/22/2023)   Overall Financial Resource Strain (CARDIA)    Difficulty of Paying Living Expenses: Not hard at all  Food Insecurity: No Food Insecurity (03/22/2023)   Hunger Vital Sign    Worried About Running Out of Food in the Last Year: Never true    Ran Out of Food in the Last Year: Never true  Transportation Needs: No Transportation Needs (03/22/2023)   PRAPARE - Administrator, Civil Service (Medical): No    Lack of Transportation (Non-Medical):  No  Physical Activity: Unknown (03/22/2023)   Exercise Vital Sign    Days of Exercise per Week: Patient declined    Minutes of Exercise per Session: 10 min  Stress: No Stress Concern Present (03/22/2023)   [employer] of Occupational Health - Occupational Stress Questionnaire    Feeling of Stress : Only a little  Social Connections: Moderately Isolated (03/22/2023)   Social Connection and Isolation Panel [NHANES]    Frequency of Communication with Friends and Family: More than three times a week    Frequency of Social Gatherings with Friends and Family: Once a week    Attends Religious Services: Never    Database Administrator or Organizations: No    Attends Banker Meetings: Not on file    Marital Status: Married    Vitals:   03/23/23 0725  BP: 124/80  Pulse: 88  Resp: 12  SpO2: 98%   Body mass index is 42.2 kg/m.  Physical Exam Vitals and nursing note reviewed.  Constitutional:      General: She is not in acute distress.    Appearance: She is well-developed.  HENT:     Head: Normocephalic and atraumatic.     Mouth/Throat:     Mouth: Mucous membranes are moist.     Pharynx: Oropharynx is clear.  Eyes:     Conjunctiva/sclera: Conjunctivae normal.  Cardiovascular:     Rate and Rhythm: Normal rate and regular rhythm.     Heart sounds: No murmur heard. Pulmonary:     Effort:  Pulmonary effort is normal. No respiratory distress.     Breath sounds: Normal breath sounds.  Abdominal:     Palpations: Abdomen is soft. There is no hepatomegaly or mass.     Tenderness: There is no abdominal tenderness.  Musculoskeletal:     Right lower leg: No edema.     Left lower leg: No edema.  Lymphadenopathy:     Cervical: No cervical adenopathy.  Skin:    General: Skin is warm.     Findings: No erythema or rash.     Comments: Hypopigmented, confluent,macular lesions face(vitiligo).  Neurological:     General: No focal deficit present.     Mental Status: She is alert and oriented to person, place, and time.     Cranial Nerves: No cranial nerve deficit.     Gait: Gait normal.  Psychiatric:        Mood and Affect: Affect normal. Mood is anxious.    ASSESSMENT AND PLAN:  Chloe Cervantes was seen today for hyperglycemia Lab Results  Component Value Date   TSH 1.89 03/23/2023   Type 2 diabetes mellitus with other specified complication, without long-term current use of insulin  (HCC) I am not sure if glucose levels are already completely explaining her episodes of dizziness and parietal headache.   Dizziness is suggestive of vertigo and headache could be tension related.  Recommend increasing Lantus  from 5 units to 8 units as planned.Advised caution with skipping meals. Continue monitoring BS. Pending appointment with endocrinologist.  Referral for diabetes/nutrition education placed. Instructed about warning signs. Follow-up with PCP in 2 weeks, before if needed.  -     Glutamic acid decarboxylase auto abs; Future -     C-peptide; Future -     Amb Referral to Nutrition and Diabetic Education  Periumbilical abdominal pain Possible causes discussed. Hx and examination are not suggestive of a serious process. Could be related with constipation. I do not think imaging is needed  at this time. Instructed about warning signs.  Constipation, unspecified constipation  type Recommend OTC MiraLAX every other day and adequate fiber intake.  Benefiber 1 teaspoon twice daily is a good option.  -     TSH; Future  Return in about 2 weeks (around 04/06/2023) for DM with PCP.  I, Leonce PARAS Wierda, acting as a scribe for Tawonda Legaspi, MD., have documented all relevant documentation on the behalf of Jeyden Coffelt, MD, as directed by  Obed Samek, MD while in the presence of Caden Fatica, MD.   I, Rebeka Kimble, MD, have reviewed all documentation for this visit. The documentation on 03/23/23 for the exam, diagnosis, procedures, and orders are all accurate and complete.  Kammi Hechler G. Meghanne Pletz, MD  [facility]. [place] office.

## 2023-03-23 ENCOUNTER — Encounter: Payer: Self-pay | Admitting: Family Medicine

## 2023-03-23 ENCOUNTER — Telehealth: Payer: Self-pay | Admitting: Family Medicine

## 2023-03-23 ENCOUNTER — Ambulatory Visit (INDEPENDENT_AMBULATORY_CARE_PROVIDER_SITE_OTHER): Payer: BC Managed Care – PPO | Admitting: Family Medicine

## 2023-03-23 VITALS — BP 124/80 | HR 88 | Resp 12 | Ht 61.5 in | Wt 227.0 lb

## 2023-03-23 DIAGNOSIS — K5900 Constipation, unspecified: Secondary | ICD-10-CM

## 2023-03-23 DIAGNOSIS — R1033 Periumbilical pain: Secondary | ICD-10-CM | POA: Diagnosis not present

## 2023-03-23 DIAGNOSIS — E1169 Type 2 diabetes mellitus with other specified complication: Secondary | ICD-10-CM | POA: Diagnosis not present

## 2023-03-23 LAB — TSH: TSH: 1.89 u[IU]/mL (ref 0.35–5.50)

## 2023-03-23 NOTE — Telephone Encounter (Unsigned)
 Copied from CRM [id number]. Topic: Referral - Status >> Mar 23, 2023  2:12 PM Chloe Cervantes wrote: Reason for CRM: Patient is calling to find out if her endocrinology referral can be expedited/urgent due to her sugar. Patient is requesting Cervantes call back.

## 2023-03-23 NOTE — Telephone Encounter (Signed)
 Pt states she had a referral placed for [facility] Endocrinology by Dr. Ival Marines. She is requesting that the priority be updated to urgent.

## 2023-03-23 NOTE — Telephone Encounter (Signed)
 Message sent to [facility]

## 2023-03-23 NOTE — Telephone Encounter (Signed)
 Seen in the ED 2/3

## 2023-03-23 NOTE — Patient Instructions (Addendum)
 A few things to remember from today's visit:  Type 2 diabetes mellitus with other specified complication, without long-term current use of insulin  (HCC) - Plan: Glutamic acid decarboxylase auto abs, C-peptide, Amb Referral to Nutrition and Diabetic Education  Periumbilical abdominal pain  Constipation, unspecified constipation type - Plan: TSH  Start Miralax every other day. Increase fiber intake, benefiber 1 tsp 2 times daily. Low car diet and avoid skipping meals. Continue monitoring blood sugars. Increase lantus  to 8 U daily and can go up to 12 U in 7-10 days of fasting glucose persistently above 140.  If you need refills for medications you take chronically, please call your pharmacy. Do not use My Chart to request refills or for acute issues that need immediate attention. If you send a my chart message, it may take a few days to be addressed, specially if I am not in the office.  Please be sure medication list is accurate. If a new problem present, please set up appointment sooner than planned today.  180-190- Headache paritalpressure.

## 2023-03-25 ENCOUNTER — Other Ambulatory Visit: Payer: Self-pay

## 2023-03-25 NOTE — Telephone Encounter (Signed)
 Please change order/ referral to urgent.

## 2023-03-28 ENCOUNTER — Other Ambulatory Visit: Payer: Self-pay

## 2023-03-28 LAB — C-PEPTIDE: C-Peptide: 4.82 ng/mL — ABNORMAL HIGH (ref 0.80–3.85)

## 2023-03-28 LAB — GLUTAMIC ACID DECARBOXYLASE AUTO ABS: Glutamic Acid Decarb Ab: <5 [IU]/mL (ref <5)

## 2023-03-29 ENCOUNTER — Encounter: Payer: Self-pay | Admitting: Endocrinology

## 2023-03-29 ENCOUNTER — Ambulatory Visit (INDEPENDENT_AMBULATORY_CARE_PROVIDER_SITE_OTHER): Payer: BC Managed Care – PPO | Admitting: Endocrinology

## 2023-03-29 VITALS — BP 116/72 | HR 68 | Ht 61.5 in | Wt 223.0 lb

## 2023-03-29 DIAGNOSIS — Z6841 Body Mass Index (BMI) 40.0 and over, adult: Secondary | ICD-10-CM

## 2023-03-29 DIAGNOSIS — E66813 Obesity, class 3: Secondary | ICD-10-CM

## 2023-03-29 DIAGNOSIS — Z794 Long term (current) use of insulin: Secondary | ICD-10-CM | POA: Diagnosis not present

## 2023-03-29 DIAGNOSIS — E1165 Type 2 diabetes mellitus with hyperglycemia: Secondary | ICD-10-CM

## 2023-03-29 DIAGNOSIS — E119 Type 2 diabetes mellitus without complications: Secondary | ICD-10-CM | POA: Diagnosis not present

## 2023-03-29 MED ORDER — TIRZEPATIDE 5 MG/0.5ML ~~LOC~~ SOAJ: 5.0000 mg | SUBCUTANEOUS | 4 refills | Status: DC

## 2023-03-29 MED ORDER — LANTUS SOLOSTAR 100 UNIT/ML ~~LOC~~ SOPN: 10.0000 [IU] | PEN_INJECTOR | Freq: Every day | SUBCUTANEOUS | 2 refills | Status: DC

## 2023-03-29 MED ORDER — METFORMIN HCL 500 MG PO TABS
500.0000 mg | ORAL_TABLET | Freq: Two times a day (BID) | ORAL | 3 refills | Status: DC
Start: 2023-03-29 — End: 2023-05-11

## 2023-03-29 NOTE — Progress Notes (Signed)
Outpatient Endocrinology Note [place] Kamil Hanigan, MD   Patient's Name: Chloe Cervantes    DOB: 20-Jun-1990    MRN: [id number]                                                    REASON OF VISIT: New consult for type 2 diabetes mellitus  REFERRING PROVIDER: Deeann Saint, MD   PCP: Deeann Saint, MD  HISTORY OF PRESENT ILLNESS:   Chloe Cervantes is a 33 y.o. old female with past medical history listed below, is here for new consult  for type 2 diabetes mellitus.   Pertinent Diabetes History: Patient was diagnosed with type 2 diabetes mellitus in September 2022, hemoglobin A1c at the time of diagnosis was 6.8%.  Patient was taking metformin prior to diagnosis of type 2 diabetes mellitus for PCOS and she had prediabetes as well.  Patient has history of gestational diabetes in 2017.  Lately patient has hyperglycemia with blood sugar up to 250 / 270 range and sometime up to 300 range postprandially and fasting blood sugar 150-200 range.  Patient has symptoms of lightheadedness with hyperglycemia.  In the January 2025 patient was started on basal insulin Lantus, 5 units daily and gradually increase to 8 units daily.  Patient has been using Dexcom G7 continuous glucose monitor as well.  History of DKA or diabetes related hospitalizations: none  Labs with negative GAD 65 antibodies and C-peptide is high which is appropriate in the setting of type 2 diabetes mellitus and obesity.   Latest Reference Range & Units 03/23/23 08:29  Glutamic Acid Decarb Ab <5 IU/mL <5  C-Peptide 0.80 - 3.85 ng/mL 4.82 (H)  (H): Data is abnormally high  Previous diabetes education: Planning for diabetic educator/dietitian visit in March.  Family h/o diabetes mellitus: mother with type 2 diabetes mellitus.   No personal history of pancreatitis and / or family history of medullary thyroid carcinoma or MEN 2B syndrome. Unknown about father side of family.  Chronic Diabetes Complications  : Retinopathy: no. Last ophthalmology exam was done on 01/2023, following with ophthalmology regularly.  Nephropathy: no Peripheral neuropathy: no  Coronary artery disease: no Stroke: no  Relevant comorbidities and cardiovascular risk factors: Obesity: yes Body mass index is 41.45 kg/m.  Hypertension: no Hyperlipidemia : no  Current / Home Diabetic regimen includes:  Metformin 1000 mg daily in the evening. Victoza 0.6 mg daily. Lantus 8 units daily, recently started.  Prior diabetic medications: GI upset with higher dose of metformin.  Glycemic data:    CONTINUOUS GLUCOSE MONITORING SYSTEM (CGMS) INTERPRETATION: At today's visit, we reviewed CGM downloads. The full report is scanned in the media. Reviewing the CGM trends, blood glucose are as follows:  Dexcom G7 CGM-  Sensor Download (Sensor download was reviewed and summarized below.) Dates: January 29 to March 29, 2023, 14 days  Glucose Management Indicator: 68% Sensor Average: 147 SD 38 Sensor usage : 6.8 %     Interpretation: -Patient has frequent hyperglycemia postprandially with meals breakfast, lunch and supper with blood sugar up to 200-250 range.  Hyperglycemia has been improving on recent days.  Blood sugar overnight, early morning fasting and in between the meals are acceptable, mostly in the range of low 100s.  No hypoglycemia.  Hypoglycemia: Patient has no hypoglycemic episodes. Patient has hypoglycemia awareness.  Factors modifying  glucose control: 1.  Diabetic diet assessment: 3 meals a day.  She has been eating less carbohydrate lately.  2.  Staying active or exercising:   3.  Medication compliance: compliant all of the time.  Interval history  Patient is a new patient to establish diabetes care.  Patient was recently started on basal insulin.  CGM Dexcom G7 data as reviewed and noted above.  Diabetes regimen as reviewed and noted above.  She denies any GI related issues taking metformin and  Victoza on the current dose.  Patient has no immediate plan for pregnancy however she we will plan for pregnancy after she is able to lose weight and also make the diabetes better.  She is currently not using birth control method.  REVIEW OF SYSTEMS As per history of present illness.   PAST MEDICAL HISTORY: Past Medical History:  Diagnosis Date   Abnormal uterine bleeding (AUB)    Arthritis    HIPS   Complication of anesthesia    hard to wake   Depression    Endometrial polyp    GERD (gastroesophageal reflux disease)    History of 2019 novel coronavirus disease (COVID-19) 01/2020   per pt mild symptoms that resolved   History of gestational diabetes    History of pregnancy induced hypertension    IDA (iron deficiency anemia)    PCOS (polycystic ovarian syndrome)    Prediabetes    Wears glasses     PAST SURGICAL HISTORY: Past Surgical History:  Procedure Laterality Date   HYSTEROSCOPY WITH D & C N/A 10/17/2018   Procedure: DILATION AND CURRETAGE WITH HYSTEROSCOPY;  Surgeon: Dara Lords, MD;  Location: [facility];  Service: Gynecology;  Laterality: N/A;   HYSTEROSCOPY WITH D & C N/A 08/12/2020   Procedure: DILATATION AND CURETTAGE /HYSTEROSCOPY;  Surgeon: Romualdo Bolk, MD;  Location: [facility];  Service: Gynecology;  Laterality: N/A;   LAPAROSCOPY Left 10/17/2018   Procedure: LAPAROSCOPIC  REMOVAL OF PARATUBAL CYST;  Surgeon: Dara Lords, MD;  Location: [facility];  Service: Gynecology;  Laterality: Left;   WISDOM TOOTH EXTRACTION     TEEN    ALLERGIES: No Active Allergies  FAMILY HISTORY:  Family History  Problem Relation Age of Onset   Diabetes Mother    Hypertension Mother    Hyperlipidemia Mother     SOCIAL HISTORY: Social History   Socioeconomic History   Marital status: Married    Spouse name: Not on file   Number of children: Not on file   Years of education: Not on file   Highest  education level: Associate degree: occupational, Scientist, product/process development, or vocational program  Occupational History   Not on file  Tobacco Use   Smoking status: Never   Smokeless tobacco: Never  Vaping Use   Vaping status: Never Used  Substance and Sexual Activity   Alcohol use: Yes    Comment: Occasional   Drug use: Never   Sexual activity: Yes    Birth control/protection: Condom    Comment: 1st intercourse 33 yo-More than 5 partners  Other Topics Concern   Not on file  Social History Narrative   Not on file   Social Drivers of Health   Financial Resource Strain: Low Risk  (03/22/2023)   Overall Financial Resource Strain (CARDIA)    Difficulty of Paying Living Expenses: Not hard at all  Food Insecurity: No Food Insecurity (03/22/2023)   Hunger Vital Sign    Worried About Running  Out of Food in the Last Year: Never true    Ran Out of Food in the Last Year: Never true  Transportation Needs: No Transportation Needs (03/22/2023)   PRAPARE - Administrator, Civil Service (Medical): No    Lack of Transportation (Non-Medical): No  Physical Activity: Unknown (03/22/2023)   Exercise Vital Sign    Days of Exercise per Week: Patient declined    Minutes of Exercise per Session: 10 min  Stress: No Stress Concern Present (03/22/2023)   [employer] of Occupational Health - Occupational Stress Questionnaire    Feeling of Stress : Only a little  Social Connections: Moderately Isolated (03/22/2023)   Social Connection and Isolation Panel [NHANES]    Frequency of Communication with Friends and Family: More than three times a week    Frequency of Social Gatherings with Friends and Family: Once a week    Attends Religious Services: Never    Database administrator or Organizations: No    Attends Engineer, structural: Not on file    Marital Status: Married    MEDICATIONS:  Current Outpatient Medications  Medication Sig Dispense Refill   blood glucose meter kit and supplies KIT  Dispense based on patient and insurance preference. Use up to four times daily as directed. 1 each 0   Blood Glucose Monitoring Suppl (ONE TOUCH ULTRA 2) w/Device KIT 1 each by Does not apply route daily. 1 kit 2   Continuous Glucose Sensor (DEXCOM G7 SENSOR) MISC USE AS DIRECTED--- CHANGE EVERY 10 DAYS 2 each 6   glucose blood test strip 1 each by Other route daily. One Touch Ultra 100 each 12   Insulin Pen Needle 32G X 4 MM MISC To use with Victoza Daily 30 each 3   liraglutide (VICTOZA) 18 MG/3ML SOPN INJECT 0.6 MG UNDER THE SKIN ONCE DAILY 3 mL 2   OneTouch Delica Lancets 30G MISC Use to check blood sugar up to 3 times daily. 100 each 3   insulin glargine (LANTUS SOLOSTAR) 100 UNIT/ML Solostar Pen Inject 10 Units into the skin at bedtime. 15 mL 2   metFORMIN (GLUCOPHAGE) 500 MG tablet Take 1 tablet (500 mg total) by mouth 2 (two) times daily with a meal. 180 tablet 3   tirzepatide (MOUNJARO) 5 MG/0.5ML Pen Inject 5 mg into the skin once a week. 6 mL 4   No current facility-administered medications for this visit.    PHYSICAL EXAM: Vitals:   03/29/23 0825  BP: 116/72  Pulse: 68  SpO2: 98%  Weight: 223 lb (101.2 kg)  Height: 5' 1.5" (1.562 m)   Body mass index is 41.45 kg/m.  Wt Readings from Last 3 Encounters:  03/29/23 223 lb (101.2 kg)  03/23/23 227 lb (103 kg)  03/16/23 228 lb 1.6 oz (103.5 kg)    General: Well developed, well nourished female in no apparent distress.  HEENT: AT/[place], no external lesions.  Eyes: Conjunctiva clear and no icterus. Neck: Neck supple  Lungs: Respirations not labored Neurologic: Alert, oriented, normal speech Extremities / Skin: Dry. No sores or rashes noted. + acanthosis nigricans Psychiatric: Does not appear depressed or anxious   Diabetic Foot Exam - Simple   Simple Foot Form Visual Inspection No deformities, no ulcerations, no other skin breakdown bilaterally: Yes Sensation Testing Intact to touch and monofilament testing bilaterally:  Yes Pulse Check Posterior Tibialis and Dorsalis pulse intact bilaterally: Yes Comments     LABS Reviewed Lab Results  Component Value Date  HGBA1C 7.2 (A) 03/16/2023   HGBA1C 6.4 03/22/2022   HGBA1C 6.2 (H) 08/27/2021   No results found for: "FRUCTOSAMINE" Lab Results  Component Value Date   CHOL 176 03/22/2022   HDL 59.70 03/22/2022   LDLCALC 103 (H) 03/22/2022   TRIG 64.0 03/22/2022   CHOLHDL 3 03/22/2022   No results found for: "MICRALBCREAT" Lab Results  Component Value Date   CREATININE 0.52 03/21/2023   Lab Results  Component Value Date   GFR 122.76 03/22/2022    ASSESSMENT / PLAN  1. Type 2 diabetes mellitus without complication, with long-term current use of insulin (HCC)   2. Class 3 severe obesity with serious comorbidity and body mass index (BMI) of 40.0 to 44.9 in adult, unspecified obesity type (HCC)   3. Diabetes mellitus without complication (HCC)   4. Uncontrolled type 2 diabetes mellitus with hyperglycemia (HCC)   5. Type 2 diabetes mellitus with hyperglycemia, with long-term current use of insulin (HCC)     Diabetes Mellitus type 2, complicated by no known complications. - Diabetic status / severity: Uncontrolled.  Lab Results  Component Value Date   HGBA1C 7.2 (A) 03/16/2023    - Hemoglobin A1c goal : <6.5%  Discussed about type 2 diabetes mellitus, potential chronic complications including diabetes retinopathy, neuropathy and nephropathy.  Discussed in detail about diet plan with limiting calories, carbohydrates and portion control, no snacks in between the meals, avoiding soft drinks.  Discussed about regular exercise.  Patient has hyperglycemia and also worsening diabetes control on metformin and Victoza.  Basal insulin was started recently at the end of January 2025.  - Medications: See below.  I) increase Lantus from 8 to 10 units daily.  Okay to increase by 2 units if her blood sugar fasting remains more than 150 every 3 to 4 days  until taking 20 units daily.  If the blood sugar fasting persistently 70-80 range okay to decrease Lantus by 2 units every few days as well.  II) I would like to start [facility] for the better diabetes control and to have better weight loss benefit, and stop Victoza.  Start Mounjaro 5 mg weekly.  If the [facility] or other GLP-1 receptor agonist are not covered we will increase the dose of Victoza gradually.  Advised to take Victoza 1.2 mg daily until she gets Mounjaro.  III) continue metformin, adjust as taking metformin 500 mg with breakfast and 500 mg with supper.  Will consider changing to metformin extended release if she gets any GI upset.  Patient is recommended to use birth control method, and avoid pregnancy while taking Victoza or Mounjaro.  Just in case if she gets pregnant advised to stop Victoza and Mounjaro immediately.  - Home glucose testing: Continue Dexcom G7 and check as needed. - Discussed/ Gave Hypoglycemia treatment plan.  # Consult : not required at this time.  She reports has appointment with diabetic educator in March.  # Annual urine for microalbuminuria/ creatinine ratio, no microalbuminuria currently, will check in the future visit. Last No results found for: "MICRALBCREAT"  # Foot check nightly.  # Annual dilated diabetic eye exams.   - Diet: Make healthy diabetic food choices, discussed in detail. - Life style / activity / exercise: Discussed.  2. Blood pressure  -  BP Readings from Last 1 Encounters:  03/29/23 116/72    - Control is in target.  - No change in current plans.  3. Lipid status / Hyperlipidemia - Last  Lab Results  Component Value  Date   LDLCALC 103 (H) 03/22/2022   -Currently not on a statin.  Not indicated.    Diagnoses and all orders for this visit:  Type 2 diabetes mellitus without complication, with long-term current use of insulin (HCC) -     tirzepatide (MOUNJARO) 5 MG/0.5ML Pen; Inject 5 mg into the skin once a week.  Class  3 severe obesity with serious comorbidity and body mass index (BMI) of 40.0 to 44.9 in adult, unspecified obesity type (HCC)  Diabetes mellitus without complication (HCC) -     metFORMIN (GLUCOPHAGE) 500 MG tablet; Take 1 tablet (500 mg total) by mouth 2 (two) times daily with a meal.  Uncontrolled type 2 diabetes mellitus with hyperglycemia (HCC) -     metFORMIN (GLUCOPHAGE) 500 MG tablet; Take 1 tablet (500 mg total) by mouth 2 (two) times daily with a meal.  Type 2 diabetes mellitus with hyperglycemia, with long-term current use of insulin (HCC) -     insulin glargine (LANTUS SOLOSTAR) 100 UNIT/ML Solostar Pen; Inject 10 Units into the skin at bedtime.  Other orders -     Discontinue: tirzepatide (MOUNJARO) 5 MG/0.5ML Pen; Inject 5 mg into the skin once a week.    DISPOSITION Follow up in clinic in 6 weeks suggested.   All questions answered and patient verbalized understanding of the plan.  [place] Sharlon Pfohl, MD [facility] Endocrinology [facility] [address] [place], [address] [place], [place] [address] Phone # [phone]  At least part of this note was generated using voice recognition software. Inadvertent word errors may have occurred, which were not recognized during the proofreading process.

## 2023-04-04 ENCOUNTER — Encounter: Payer: Self-pay | Admitting: Family Medicine

## 2023-04-04 ENCOUNTER — Ambulatory Visit (INDEPENDENT_AMBULATORY_CARE_PROVIDER_SITE_OTHER): Payer: BC Managed Care – PPO | Admitting: Family Medicine

## 2023-04-04 VITALS — BP 118/80 | HR 85 | Temp 98.7°F | Ht 61.75 in | Wt 223.4 lb

## 2023-04-04 DIAGNOSIS — F419 Anxiety disorder, unspecified: Secondary | ICD-10-CM

## 2023-04-04 DIAGNOSIS — Z0000 Encounter for general adult medical examination without abnormal findings: Secondary | ICD-10-CM

## 2023-04-04 DIAGNOSIS — Z30011 Encounter for initial prescription of contraceptive pills: Secondary | ICD-10-CM | POA: Diagnosis not present

## 2023-04-04 DIAGNOSIS — Z794 Long term (current) use of insulin: Secondary | ICD-10-CM | POA: Diagnosis not present

## 2023-04-04 DIAGNOSIS — L719 Rosacea, unspecified: Secondary | ICD-10-CM

## 2023-04-04 DIAGNOSIS — L80 Vitiligo: Secondary | ICD-10-CM | POA: Diagnosis not present

## 2023-04-04 DIAGNOSIS — E1169 Type 2 diabetes mellitus with other specified complication: Secondary | ICD-10-CM | POA: Diagnosis not present

## 2023-04-04 DIAGNOSIS — Z13220 Encounter for screening for lipoid disorders: Secondary | ICD-10-CM

## 2023-04-04 MED ORDER — NORETHIN ACE-ETH ESTRAD-FE 1-20 MG-MCG PO TABS
1.0000 | ORAL_TABLET | Freq: Every day | ORAL | 11 refills | Status: DC
Start: 2023-04-04 — End: 2023-06-05

## 2023-04-04 MED ORDER — METRONIDAZOLE 0.75 % EX GEL: 1.0000 | Freq: Two times a day (BID) | CUTANEOUS | 0 refills | Status: DC

## 2023-04-04 NOTE — Progress Notes (Signed)
 Established Patient Office Visit   Subjective  Patient ID: Kym Scannell, female    DOB: 1990-02-20  Age: 32 y.o. MRN: [id number]  Chief Complaint  Patient presents with   Annual Exam    Pt is a 33 yo female seen for CPE and f/u on chronic conditions.  Pt stopped wearing cgm as bs seemed stable.  Endorses recent OFV and ED visit for hyperglycemia.  Started on lantus 10 units, mounjaro 5 mg wkly, and metformin 500 mg BID.  Noticing some improvement in bs.  Has appt with Endo. Requesting refills on one touch ultra test strips.  Pt interestedi n starting OCPs.  Would like to try metro gel again for rosacea.  Previously thought was having an allergic rxn, however noticed most facial creams/cleansers cause similar sx of erythema of cheeks x 30 min after applying.    Patient Active Problem List   Diagnosis Date Noted   Hepatic steatosis 01/15/2022   Vitamin D deficiency 01/15/2022   Type 2 diabetes mellitus with other specified complication (HCC) 04/20/2021   Anemia 04/14/2021   Femoral acetabular impingement 02/29/2020   PCOS (polycystic ovarian syndrome) 04/07/2018   Anxiety and depression 04/07/2018   Iron deficiency anemia 04/07/2018   Obesity 09/21/2017   Abnormal uterine bleeding (AUB) 01/21/2017   Past Medical History:  Diagnosis Date   Abnormal uterine bleeding (AUB)    Arthritis    HIPS   Complication of anesthesia    hard to wake   Depression    Endometrial polyp    GERD (gastroesophageal reflux disease)    History of 2019 novel coronavirus disease (COVID-19) 01/2020   per pt mild symptoms that resolved   History of gestational diabetes    History of pregnancy induced hypertension    IDA (iron deficiency anemia)    PCOS (polycystic ovarian syndrome)    Prediabetes    Wears glasses    Past Surgical History:  Procedure Laterality Date   HYSTEROSCOPY WITH D & C N/A 10/17/2018   Procedure: DILATION AND CURRETAGE WITH HYSTEROSCOPY;  Surgeon: Dara Lords, MD;  Location: [facility];  Service: Gynecology;  Laterality: N/A;   HYSTEROSCOPY WITH D & C N/A 08/12/2020   Procedure: DILATATION AND CURETTAGE /HYSTEROSCOPY;  Surgeon: Romualdo Bolk, MD;  Location: [facility];  Service: Gynecology;  Laterality: N/A;   LAPAROSCOPY Left 10/17/2018   Procedure: LAPAROSCOPIC  REMOVAL OF PARATUBAL CYST;  Surgeon: Dara Lords, MD;  Location: [facility];  Service: Gynecology;  Laterality: Left;   WISDOM TOOTH EXTRACTION     TEEN   Social History   Tobacco Use   Smoking status: Never   Smokeless tobacco: Never  Vaping Use   Vaping status: Never Used  Substance Use Topics   Alcohol use: Yes    Comment: Occasional   Drug use: Never   Family History  Problem Relation Age of Onset   Diabetes Mother    Hypertension Mother    Hyperlipidemia Mother    Allergies  Allergen Reactions   Metronidazole Rash      ROS Negative unless stated above    Objective:     BP 118/80   Pulse 85   Temp 98.7 F (37.1 C) (Oral)   Ht 5' 1.75" (1.568 m)   Wt 223 lb 6.4 oz (101.3 kg)   SpO2 98%   BMI 41.19 kg/m  BP Readings from Last 3 Encounters:  04/04/23 118/80  03/29/23 116/72  03/23/23 124/80   Wt Readings from Last 3 Encounters:  04/04/23 223 lb 6.4 oz (101.3 kg)  03/29/23 223 lb (101.2 kg)  03/23/23 227 lb (103 kg)   Physical Exam Constitutional:      Appearance: Normal appearance.  HENT:     Head: Normocephalic and atraumatic.     Right Ear: Tympanic membrane, ear canal and external ear normal.     Left Ear: Tympanic membrane, ear canal and external ear normal.     Nose: Nose normal.     Mouth/Throat:     Mouth: Mucous membranes are moist.     Pharynx: No oropharyngeal exudate or posterior oropharyngeal erythema.  Eyes:     General: No scleral icterus.    Extraocular Movements: Extraocular movements intact.     Conjunctiva/sclera: Conjunctivae normal.     Pupils:  Pupils are equal, round, and reactive to light.  Neck:     Thyroid: No thyromegaly.  Cardiovascular:     Rate and Rhythm: Normal rate and regular rhythm.     Pulses: Normal pulses.     Heart sounds: Normal heart sounds. No murmur heard.    No friction rub.  Pulmonary:     Effort: Pulmonary effort is normal.     Breath sounds: Normal breath sounds. No wheezing, rhonchi or rales.  Abdominal:     General: Bowel sounds are normal.     Palpations: Abdomen is soft.     Tenderness: There is no abdominal tenderness.  Musculoskeletal:        General: No deformity. Normal range of motion.  Lymphadenopathy:     Cervical: No cervical adenopathy.  Skin:    General: Skin is warm and dry.     Findings: No lesion.  Neurological:     General: No focal deficit present.     Mental Status: She is alert and oriented to person, place, and time.  Psychiatric:        Mood and Affect: Mood normal.        Thought Content: Thought content normal.      04/04/2023    3:52 PM 10/20/2022   10:17 AM 03/22/2022    1:18 PM  Depression screen PHQ 2/9  Decreased Interest 0 0 1  Down, Depressed, Hopeless 1 2 1   PHQ - 2 Score 1 2 2   Altered sleeping 0 2 1  Tired, decreased energy 2 3 1   Change in appetite 0 3 0  Feeling bad or failure about yourself  0 3 0  Trouble concentrating 0 0 0  Moving slowly or fidgety/restless 0 0 0  Suicidal thoughts 0 0 0  PHQ-9 Score 3 13 4   Difficult doing work/chores Not difficult at all Not difficult at all Not difficult at all      04/04/2023    3:53 PM 10/20/2022   10:17 AM 03/22/2022    1:19 PM 12/08/2020   11:08 AM  GAD 7 : Generalized Anxiety Score  Nervous, Anxious, on Edge 3 2 1 3   Control/stop worrying 2 3 1 3   Worry too much - different things 3 3 1 3   Trouble relaxing 0 0 0 1  Restless 0 0 0 1  Easily annoyed or irritable 3 2 1 3   Afraid - awful might happen 3 3 1 3   Total GAD 7 Score 14 13 5 17   Anxiety Difficulty Somewhat difficult Not difficult at all Not  difficult at all Very difficult    No results found for any visits  on 04/04/23.    Assessment & Plan:  Well adult exam -     Lipid panel; Future  Encounter for initial prescription of contraceptive pills -     Norethin Ace-Eth Estrad-FE; Take 1 tablet by mouth daily.  Dispense: 28 tablet; Refill: 11  Type 2 diabetes mellitus with other specified complication, with long-term current use of insulin (HCC) -     Lipid panel; Future  Rosacea -     metroNIDAZOLE; Apply 1 Application topically 2 (two) times daily.  Dispense: 45 g; Refill: 0  Screening for cholesterol level -     Lipid panel; Future  Anxiety -GAD 7 score 14 this visit -consider counseling and medication options -continue to monitor.  Vitiligo -stable -continue f/u with derm  Patient seen for CPE and follow-up on chronic conditions.  Age-appropriate health screenings discussed.  Recent labs from 03/21/23 reviewed.  Lipid panel order, to be drawn at pt's convenience when fasting. Pap up to date, done 04/16/21.  Next CPE in 1 yr.  Hgb A1C 7.2% on 03/16/23.  Continue lantus 10 mg, metformin 500 mg BID, and mounjaro 5 mg wkly.  Continue monitoring bs.  Encouraged to keep f/u with Endo.  Foot exam up to date.  Pt to schedule eye exam.  Pt wishes to restart metro gel, rx sent in.  Appears to be less of an allergic rxn to med and more of a general sensitivity of skin to products.  Given strict precautions.    Return in about 3 months (around 07/02/2023).   Deeann Saint, MD

## 2023-04-05 ENCOUNTER — Encounter: Payer: Self-pay | Admitting: Family Medicine

## 2023-04-05 ENCOUNTER — Other Ambulatory Visit: Payer: Self-pay | Admitting: Family Medicine

## 2023-04-05 ENCOUNTER — Telehealth (INDEPENDENT_AMBULATORY_CARE_PROVIDER_SITE_OTHER): Payer: BC Managed Care – PPO | Admitting: Family Medicine

## 2023-04-05 ENCOUNTER — Ambulatory Visit: Payer: Self-pay | Admitting: Family Medicine

## 2023-04-05 VITALS — Ht 61.75 in

## 2023-04-05 DIAGNOSIS — J069 Acute upper respiratory infection, unspecified: Secondary | ICD-10-CM | POA: Diagnosis not present

## 2023-04-05 NOTE — Telephone Encounter (Deleted)
Additional Information . Commented on: [1] MODERATE pain (e.g., interferes with normal activities) AND [2] present > 3 days    Body aches present < 3 days  Protocols used: Muscle Aches and Body Pain-A-AH

## 2023-04-05 NOTE — Telephone Encounter (Signed)
Copied from CRM [id number]. Topic: Clinical - Red Word Triage >> Apr 05, 2023 12:17 PM Prudencio Pair wrote: Red Word that prompted transfer to Nurse Triage: Patient wants a virtual appt. States she think she may have the flu. Symptoms are body aches, nausea, some nasal drainage, little bit of a sore throat, and getting chills. Patient states she has no fever.  Chief Complaint: Flu-like symptoms Symptoms: sore throat, body aches, nausea, fatigue, feeling hot and cold (temp 97.3 today) Frequency: Ongoing for a couple days Pertinent Negatives: Patient denies fever Disposition: [] ED /[] Urgent Care (no appt availability in office) / [x] Appointment(In office/virtual)/ []  [facility]/ [] Home Care/ [] Refused Recommended Disposition /[]  Mobile Bus/ []  Follow-up with PCP Additional Notes: Patient suspects that she might have the flu. She is experiencing sore throat, body aches, nausea, fatigue. Symptoms started a couple days ago. Patient requested virtual visit. Appointment scheduled for this afternoon.   Reason for Disposition  [1] MODERATE pain (e.g., interferes with normal activities) AND [2] present > 3 days  Answer Assessment - Initial Assessment Questions 1. ONSET: "When did the muscle aches or body pains start?"      Today   2. LOCATION: "What part of your body is hurting?" (e.g., entire body, arms, legs)      All over when walking. Mainly back aching while at rest  3. SEVERITY: "How bad is the pain?" (Scale 1-10; or mild, moderate, severe)   - MILD (1-3): doesn't interfere with normal activities    - MODERATE (4-7): interferes with normal activities or awakens from sleep    - SEVERE (8-10):  excruciating pain, unable to do any normal activities      7/10  4. CAUSE: "What do you think is causing the pains?"     Flu  5. FEVER: "Have you been having fever?"     No  6. OTHER SYMPTOMS: "Do you have any other symptoms?" (e.g., chest pain, weakness, rash, cold or flu  symptoms, weight loss)     Flu symptoms  Protocols used: Muscle Aches and Body Pain-A-AH

## 2023-04-05 NOTE — Progress Notes (Signed)
Virtual Visit via Video Note I connected with Chloe Cervantes on 04/05/2023 by a video enabled telemedicine application and verified that I am speaking with the correct person using two identifiers. Location patient: home Location provider:work office Persons participating in the virtual visit: patient, provider, scribe  I discussed the limitations of evaluation and management by telemedicine and the availability of in person appointments. The patient expressed understanding and agreed to proceed.  Chief Complaint  Patient presents with   flu-like symptoms   HPI: Chloe Cervantes is a 33 y.o. female with a PMHx significant for hepatic steatosis, DM II, PCOS, vitamin D deficiency, iron deficiency anemia, vitiligo, anxiety, and depression, among some, who is being seen on video today for flu like symptoms.   Patient complains of sore throat, body aches, fatigue, chills, nausea, diarrhea x 1, and minimal nasal congestion starting on 2/15 with the sore throat.  She has been taking nyquil and dayquil for her symptoms.   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 Her daughter's teacher also had the flu.  She has not done a flu test.  Pertinent negatives include fever, cough, wheezing, SOB, rhinorrhea, abdominal pain, vomiting, urinary symptoms,or skin rash.   ROS: See pertinent positives and negatives per HPI.  Past Medical History:  Diagnosis Date   Abnormal uterine bleeding (AUB)    Arthritis    HIPS   Complication of anesthesia    hard to wake   Depression    Endometrial polyp    GERD (gastroesophageal reflux disease)    History of 2019 novel coronavirus disease (COVID-19) 01/2020   per pt mild symptoms that resolved   History of gestational diabetes    History of pregnancy induced hypertension    IDA (iron deficiency anemia)    PCOS (polycystic ovarian syndrome)     Prediabetes    Wears glasses     Past Surgical History:  Procedure Laterality Date   HYSTEROSCOPY WITH D & C N/A 10/17/2018   Procedure: DILATION AND CURRETAGE WITH HYSTEROSCOPY;  Surgeon: Dara Lords, MD;  Location: [facility];  Service: Gynecology;  Laterality: N/A;   HYSTEROSCOPY WITH D & C N/A 08/12/2020   Procedure: DILATATION AND CURETTAGE /HYSTEROSCOPY;  Surgeon: Romualdo Bolk, MD;  Location: [facility];  Service: Gynecology;  Laterality: N/A;   LAPAROSCOPY Left 10/17/2018   Procedure: LAPAROSCOPIC  REMOVAL OF PARATUBAL CYST;  Surgeon: Dara Lords, MD;  Location: [facility];  Service: Gynecology;  Laterality: Left;   WISDOM TOOTH EXTRACTION     TEEN    Family History  Problem Relation Age of Onset   Diabetes Mother    Hypertension Mother    Hyperlipidemia Mother     Social History   Socioeconomic History   Marital status: Married    Spouse name: Not on file   Number of children: Not on file   Years of education: Not on file   Highest education level: Associate degree: occupational, Scientist, product/process development, or vocational program  Occupational History   Not on file  Tobacco Use   Smoking status: Never   Smokeless tobacco: Never  Vaping Use   Vaping status: Never Used  Substance and Sexual Activity   Alcohol use: Yes    Comment: Occasional   Drug use: Never   Sexual activity: Yes    Birth control/protection: Condom    Comment:  1st intercourse 33 yo-More than 5 partners  Other Topics Concern   Not on file  Social History Narrative   Not on file   Social Drivers of Health   Financial Resource Strain: Low Risk  (03/22/2023)   Overall Financial Resource Strain (CARDIA)    Difficulty of Paying Living Expenses: Not hard at all  Food Insecurity: No Food Insecurity (03/22/2023)   Hunger Vital Sign    Worried About Running Out of Food in the Last Year: Never true    Ran Out of Food in the Last Year: Never true   Transportation Needs: No Transportation Needs (03/22/2023)   PRAPARE - Administrator, Civil Service (Medical): No    Lack of Transportation (Non-Medical): No  Physical Activity: Unknown (03/22/2023)   Exercise Vital Sign    Days of Exercise per Week: Patient declined    Minutes of Exercise per Session: 10 min  Stress: No Stress Concern Present (03/22/2023)   [employer] of Occupational Health - Occupational Stress Questionnaire    Feeling of Stress : Only a little  Social Connections: Moderately Isolated (03/22/2023)   Social Connection and Isolation Panel [NHANES]    Frequency of Communication with Friends and Family: More than three times a week    Frequency of Social Gatherings with Friends and Family: Once a week    Attends Religious Services: Never    Database administrator or Organizations: No    Attends Engineer, structural: Not on file    Marital Status: Married  Intimate Partner Violence: Unknown (10/23/2021)   Received from [employer], [facility]   HITS    Physically Hurt: Not on file    Insult or Talk Down To: Not on file    Threaten Physical Harm: Not on file    Scream or Curse: Not on file     Current Outpatient Medications:    blood glucose meter kit and supplies KIT, Dispense based on patient and insurance preference. Use up to four times daily as directed., Disp: 1 each, Rfl: 0   Blood Glucose Monitoring Suppl (ONE TOUCH ULTRA 2) w/Device KIT, 1 each by Does not apply route daily., Disp: 1 kit, Rfl: 2   Continuous Glucose Sensor (DEXCOM G7 SENSOR) MISC, USE AS DIRECTED--- CHANGE EVERY 10 DAYS, Disp: 2 each, Rfl: 6   glucose blood test strip, 1 each by Other route daily. One Touch Ultra, Disp: 100 each, Rfl: 12   insulin glargine (LANTUS SOLOSTAR) 100 UNIT/ML Solostar Pen, Inject 10 Units into the skin at bedtime., Disp: 15 mL, Rfl: 2   Insulin Pen Needle 32G X 4 MM MISC, To use with Victoza Daily, Disp: 30 each, Rfl: 3   metFORMIN  (GLUCOPHAGE) 500 MG tablet, Take 1 tablet (500 mg total) by mouth 2 (two) times daily with a meal., Disp: 180 tablet, Rfl: 3   metroNIDAZOLE (METROGEL) 0.75 % gel, Apply 1 Application topically 2 (two) times daily., Disp: 45 g, Rfl: 0   norethindrone-ethinyl estradiol-FE (LOESTRIN FE) 1-20 MG-MCG tablet, Take 1 tablet by mouth daily., Disp: 28 tablet, Rfl: 11   tirzepatide (MOUNJARO) 5 MG/0.5ML Pen, Inject 5 mg into the skin once a week., Disp: 6 mL, Rfl: 4  EXAM:  VITALS per patient if applicable:Ht 5' 1.75" (1.568 m)   BMI 41.19 kg/m   GENERAL: alert, oriented, appears well and in no acute distress  HEENT: atraumatic, conjunctiva clear, no obvious abnormalities on inspection of external nose and ears  NECK: normal  movements of the head and neck  LUNGS: on inspection no signs of respiratory distress, breathing rate appears normal, no obvious gross SOB, gasping or wheezing  CV: no obvious cyanosis  MS: moves all visible extremities without noticeable abnormality  PSYCH/NEURO: pleasant and cooperative, no obvious depression or anxiety, speech and thought processing grossly intact  ASSESSMENT AND PLAN:  Discussed the following assessment and plan:  URI, acute Symptoms suggests a viral etiology. Monitor for new symptoms. We discussed options, including empiric treatment with Tamiflu vs doing a home test today. She will perform a home COVID and flu test and let me know today about results. Instructed to monitor for signs of complications, including new onset of fever among some. Instructed about warning signs. States that she does not need a note for work. F/U as needed.  She send a message to let us know that home covid and flu test were negative.  We discussed possible serious and likely etiologies, options for evaluation and workup, limitations of telemedicine visit vs in person visit, treatment, treatment risks and precautions. The patient was advised to call back or seek an  in-person evaluation if the symptoms worsen or if the condition fails to improve as anticipated. I discussed the assessment and treatment plan with the patient. The patient was provided an opportunity to ask questions and all were answered. The patient agreed with the plan and demonstrated an understanding of the instructions.  Return if symptoms worsen or fail to improve.  I, Rolla Etienne Wierda, acting as a scribe for Ashutosh Dieguez [place], MD., have documented all relevant documentation on the behalf of Salimah Martinovich [place], MD, as directed by  Adesuwa Osgood [place], MD while in the presence of Edoardo Laforte [place], MD.   I, Arn Mcomber [place], MD, have reviewed all documentation for this visit. The documentation on 04/05/23 for the exam, diagnosis, procedures, and orders are all accurate and complete.  Deloy Archey [place], MD

## 2023-04-10 DIAGNOSIS — L719 Rosacea, unspecified: Secondary | ICD-10-CM | POA: Insufficient documentation

## 2023-04-10 DIAGNOSIS — L80 Vitiligo: Secondary | ICD-10-CM | POA: Insufficient documentation

## 2023-04-28 ENCOUNTER — Encounter: Payer: BC Managed Care – PPO | Attending: Family Medicine | Admitting: Skilled Nursing Facility1

## 2023-04-28 ENCOUNTER — Encounter: Payer: Self-pay | Admitting: Skilled Nursing Facility1

## 2023-04-28 VITALS — Ht 61.75 in | Wt 218.0 lb

## 2023-04-28 DIAGNOSIS — E1169 Type 2 diabetes mellitus with other specified complication: Secondary | ICD-10-CM | POA: Insufficient documentation

## 2023-04-28 NOTE — Progress Notes (Signed)
 DM: Mounjaro Metformin Lantus 10 units every evening taken between 9:30-10pm  Dexcom  Other Dx: Depression GERD Arthritis Anemia  PCOS  Pt states she has had DM for about 3 or 4 years.   CGM Results from download: 4 days of data  % Time CGM active:   71% %   (Goal >70%)  Average glucose:   123 mg/dL for 4 days  Glucose management indicator:    %  Time in range (70-180 mg/dL):   16% %   (Goal >10%)  Time High (181-250 mg/dL):   %   (Goal < 96%)  Time Very High (>250 mg/dL):    %   (Goal < 5%)  Time Low (54-69 mg/dL):    %   (Goal <0%)  Time Very Low (<54 mg/dL):   %   (Goal <4%)  %CV (glucose variability)     %  (Goal <36%)    Pt states she has been trying to eat more from home due to finances as well as health stating her husband is supportive of this.   Diabetes Self-Management Education  Visit Type: First/Initial  Appt. Start Time: 2:38 Appt. End Time: 3:45  05/02/2023  Chloe Cervantes, identified by name and date of birth, is a 33 y.o. female with a diagnosis of Diabetes: Type 2.   ASSESSMENT  Height 5' 1.75" (1.568 m), weight 218 lb (98.9 kg). Body mass index is 40.2 kg/m.   Diabetes Self-Management Education - 04/28/23 1446       Visit Information   Visit Type First/Initial      Initial Visit   Diabetes Type Type 2    Are you currently following a meal plan? No    Are you taking your medications as prescribed? Yes      Health Coping   How would you rate your overall health? Good      Psychosocial Assessment   Patient Belief/Attitude about Diabetes Motivated to manage diabetes    What is the hardest part about your diabetes right now, causing you the most concern, or is the most worrisome to you about your diabetes?   Making healty food and beverage choices    Self-care barriers None    Self-management support Family;Friends    Patient Concerns Nutrition/Meal planning    Special Needs None    Preferred Learning Style Visual    Learning  Readiness Contemplating    How often do you need to have someone help you when you read instructions, pamphlets, or other written materials from your doctor or pharmacy? 1 - Never      Pre-Education Assessment   Patient understands the diabetes disease and treatment process. Needs Instruction    Patient understands incorporating nutritional management into lifestyle. Needs Instruction    Patient undertands incorporating physical activity into lifestyle. Needs Instruction    Patient understands using medications safely. Needs Instruction    Patient understands monitoring blood glucose, interpreting and using results Needs Instruction    Patient understands prevention, detection, and treatment of acute complications. Needs Instruction    Patient understands prevention, detection, and treatment of chronic complications. Needs Instruction    Patient understands how to develop strategies to address psychosocial issues. Needs Instruction    Patient understands how to develop strategies to promote health/change behavior. Needs Instruction      Complications   Last HgB A1C per patient/outside source 7.2 %    How often do you check your blood sugar? > 4 times/day  Have you had a dilated eye exam in the past 12 months? Yes    Have you had a dental exam in the past 12 months? Yes    Are you checking your feet? No      Dietary Intake   Breakfast skipped or hot pocket or baegl or fast food    Lunch fast food    Dinner fast food      Activity / Exercise   Activity / Exercise Type ADL's    How many days per week do you exercise? 0    How many minutes per day do you exercise? 0    Total minutes per week of exercise 0      Patient Education   Previous Diabetes Education No    Disease Pathophysiology Definition of diabetes, type 1 and 2, and the diagnosis of diabetes;Factors that contribute to the development of diabetes    Healthy Eating Plate Method;Food label reading, portion sizes and  measuring food.;Role of diet in the treatment of diabetes and the relationship between the three main macronutrients and blood glucose level;Meal timing in regards to the patients' current diabetes medication.    Being Active Role of exercise on diabetes management, blood pressure control and cardiac health.;Identified with patient nutritional and/or medication changes necessary with exercise.    Medications Reviewed patients medication for diabetes, action, purpose, timing of dose and side effects.    Monitoring Taught/evaluated CGM (comment)    Acute complications Taught prevention, symptoms, and  treatment of hypoglycemia - the 15 rule.;Discussed and identified patients' prevention, symptoms, and treatment of hyperglycemia.    Diabetes Stress and Support Role of stress on diabetes      Individualized Goals (developed by patient)   Nutrition Follow meal plan discussed;General guidelines for healthy choices and portions discussed    Physical Activity Exercise 5-7 days per week;60 minutes per day    Medications take my medication as prescribed    Problem Solving Eating Pattern;Medication consistency    Reducing Risk do foot checks daily;treat hypoglycemia with 15 grams of carbs if blood glucose less than 70mg /dL      Post-Education Assessment   Patient understands the diabetes disease and treatment process. Demonstrates understanding / competency    Patient understands incorporating nutritional management into lifestyle. Demonstrates understanding / competency    Patient undertands incorporating physical activity into lifestyle. Demonstrates understanding / competency    Patient understands using medications safely. Demonstrates understanding / competency    Patient understands monitoring blood glucose, interpreting and using results Demonstrates understanding / competency    Patient understands prevention, detection, and treatment of acute complications. Demonstrates understanding / competency     Patient understands prevention, detection, and treatment of chronic complications. Demonstrates understanding / competency    Patient understands how to develop strategies to address psychosocial issues. Demonstrates understanding / competency    Patient understands how to develop strategies to promote health/change behavior. Demonstrates understanding / competency      Outcomes   Expected Outcomes Demonstrated interest in learning. Expect positive outcomes    Future DMSE 4-6 wks    Program Status Completed             Individualized Plan for Diabetes Self-Management Training:   Learning Objective:  Patient will have a greater understanding of diabetes self-management. Patient education plan is to attend individual and/or group sessions per assessed needs and concerns.    Expected Outcomes:  Demonstrated interest in learning. Expect positive outcomes  Education material provided: ADA -  How to Thrive: A Guide for Your Journey with Diabetes, My Plate, Snack sheet, and Carbohydrate counting sheet  If problems or questions, patient to contact team via:  Phone and Email  Future DSME appointment: 4-6 wks

## 2023-05-02 ENCOUNTER — Ambulatory Visit: Payer: BC Managed Care – PPO | Admitting: Endocrinology

## 2023-05-08 ENCOUNTER — Ambulatory Visit
Admission: EM | Admit: 2023-05-08 | Discharge: 2023-05-08 | Disposition: A | Discharge status: Home / Self Care | Attending: Internal Medicine | Admitting: Internal Medicine

## 2023-05-08 ENCOUNTER — Encounter: Payer: Self-pay | Admitting: *Deleted

## 2023-05-08 ENCOUNTER — Other Ambulatory Visit: Payer: Self-pay

## 2023-05-08 DIAGNOSIS — J069 Acute upper respiratory infection, unspecified: Secondary | ICD-10-CM

## 2023-05-08 DIAGNOSIS — H65192 Other acute nonsuppurative otitis media, left ear: Secondary | ICD-10-CM

## 2023-05-08 MED ORDER — AMOXICILLIN 875 MG PO TABS: 875.0000 mg | ORAL_TABLET | Freq: Two times a day (BID) | ORAL | 0 refills | Status: AC

## 2023-05-08 MED ORDER — BENZONATATE 100 MG PO CAPS: 100.0000 mg | ORAL_CAPSULE | Freq: Three times a day (TID) | ORAL | 0 refills | Status: DC | PRN

## 2023-05-08 NOTE — Discharge Instructions (Signed)
 I have prescribed an antibiotic for ear infection.  Cough medication also prescribed.  Follow-up if any symptoms persist or worsen.

## 2023-05-08 NOTE — ED Triage Notes (Signed)
 Starting Tuesday pt had sore throat, then developed subjective fever, chills, cough, left ear pain, and congestion. She had negative home covid/flu test on Friday. Today still has cough and L earache and sounds are "muffled"

## 2023-05-08 NOTE — ED Provider Notes (Signed)
 [facility]    CSN: [id number] Arrival date & time: 05/08/23  0936      History   Chief Complaint Chief Complaint  Patient presents with   Otalgia   Cough    HPI Chloe Cervantes is a 33 y.o. female.   Patient presents with sore throat, nasal congestion, cough, chills, ear pain.  Reports that all symptoms have resolved except for cough and left ear pain.  Left ear pain developed 2 days ago and all other symptoms started about 5 days ago.  Denies any fever at home.  Reports family members have been sick.  Has taken over-the-counter cold and flu medications for symptoms with some improvement.   Otalgia Cough   Past Medical History:  Diagnosis Date   Abnormal uterine bleeding (AUB)    Arthritis    HIPS   Complication of anesthesia    hard to wake   Depression    Diabetes mellitus without complication (HCC)    Endometrial polyp    GERD (gastroesophageal reflux disease)    History of 2019 novel coronavirus disease (COVID-19) 01/2020   per pt mild symptoms that resolved   History of gestational diabetes    History of pregnancy induced hypertension    IDA (iron deficiency anemia)    PCOS (polycystic ovarian syndrome)    Prediabetes    Wears glasses     Patient Active Problem List   Diagnosis Date Noted   Vitiligo 04/10/2023   Rosacea 04/10/2023   Hepatic steatosis 01/15/2022   Vitamin D deficiency 01/15/2022   Type 2 diabetes mellitus with other specified complication (HCC) 04/20/2021   Anemia 04/14/2021   Femoral acetabular impingement 02/29/2020   PCOS (polycystic ovarian syndrome) 04/07/2018   Anxiety and depression 04/07/2018   Iron deficiency anemia 04/07/2018   Obesity 09/21/2017   Abnormal uterine bleeding (AUB) 01/21/2017    Past Surgical History:  Procedure Laterality Date   HYSTEROSCOPY WITH D & C N/A 10/17/2018   Procedure: DILATION AND CURRETAGE WITH HYSTEROSCOPY;  Surgeon: Dara Lords, MD;  Location: [facility];  Service: Gynecology;  Laterality: N/A;   HYSTEROSCOPY WITH D & C N/A 08/12/2020   Procedure: DILATATION AND CURETTAGE /HYSTEROSCOPY;  Surgeon: Romualdo Bolk, MD;  Location: [facility];  Service: Gynecology;  Laterality: N/A;   LAPAROSCOPY Left 10/17/2018   Procedure: LAPAROSCOPIC  REMOVAL OF PARATUBAL CYST;  Surgeon: Dara Lords, MD;  Location:  SUR[facility]ERY C[facility];  Service: Gynecology;  Laterality: Left;   WISDOM TOOTH EXTRACTION     TEEN    OB History     Gravida  3   Para  2   Term  2   Preterm      AB  1   Living  2      SAB  1   IAB      Ectopic      Multiple      Live Births               Home Medications    Prior to Admission medications   Medication Sig Start Date End Date Taking? Authorizing Provider  amoxicillin (AMOXIL) 875 MG tablet Take 1 tablet (875 mg total) by mouth 2 (two) times daily for 7 days. 05/08/23 05/15/23 Yes Alfreda Hammad, Acie Fredrickson, FNP  benzonatate (TESSALON) 100 MG capsule Take 1 capsule (100 mg total) by mouth every 8 (eight) hours as needed for cough. 05/08/23  Yes Gustavus Bryant, FNP  Continuous Glucose Sensor (DEXCOM G7 SENSOR) MISC USE AS DIRECTED--- CHANGE EVERY 10 DAYS 11/25/22  Yes Deeann Saint, MD  insulin glargine (LANTUS SOLOSTAR) 100 UNIT/ML Solostar Pen Inject 10 Units into the skin at bedtime. 03/29/23  Yes Thapa, [place], MD  metFORMIN (GLUCOPHAGE) 500 MG tablet Take 1 tablet (500 mg total) by mouth 2 (two) times daily with a meal. 03/29/23  Yes Thapa, [place], MD  tirzepatide [facility]) 5 MG/0.5ML Pen Inject 5 mg into the skin once a week. 03/29/23  Yes Thapa, [place], MD  blood glucose meter kit and supplies KIT Dispense based on patient and insurance preference. Use up to four times daily as directed. 01/04/22   Deeann Saint, MD  Blood Glucose Monitoring Suppl (ONE TOUCH ULTRA 2) w/Device KIT 1 each by Does not apply route daily. 12/09/21   Deeann Saint, MD  glucose blood  test strip 1 each by Other route daily. One Touch Ultra 03/16/23   Philip Aspen, Limmie Patricia, MD  Insulin Pen Needle 32G X 4 MM MISC To use with Victoza Daily 11/08/22   Deeann Saint, MD  Lancets (ONETOUCH DELICA PLUS LANCET33G) MISC 1 EACH BY OTHER ROUTE DAILY. E11.69 04/06/23   Deeann Saint, MD  metroNIDAZOLE (METROGEL) 0.75 % gel Apply 1 Application topically 2 (two) times daily. Patient not taking: Reported on 05/08/2023 04/04/23   Deeann Saint, MD  norethindrone-ethinyl estradiol-FE (LOESTRIN FE) 1-20 MG-MCG tablet Take 1 tablet by mouth daily. Patient not taking: Reported on 05/08/2023 04/04/23   Deeann Saint, MD    Family History Family History  Problem Relation Age of Onset   Diabetes Mother    Hypertension Mother    Hyperlipidemia Mother     Social History Social History   Tobacco Use   Smoking status: Never   Smokeless tobacco: Never  Vaping Use   Vaping status: Never Used  Substance Use Topics   Alcohol use: Yes    Comment: Occasional   Drug use: Never     Allergies   Metronidazole   Review of Systems Review of Systems Per HPI  Physical Exam Triage Vital Signs ED Triage Vitals  Encounter Vitals Group     BP 05/08/23 0955 105/70     Systolic BP Percentile --      Diastolic BP Percentile --      Pulse Rate 05/08/23 0955 95     Resp 05/08/23 0955 16     Temp 05/08/23 0955 98.3 F (36.8 C)     Temp Source 05/08/23 0955 Oral     SpO2 05/08/23 0955 98 %     Weight --      Height --      Head Circumference --      Peak Flow --      Pain Score 05/08/23 0952 6     Pain Loc --      Pain Education --      Exclude from Growth Chart --    No data found.  Updated Vital Signs BP 105/70 (BP Location: Left Arm)   Pulse 95   Temp 98.3 F (36.8 C) (Oral)   Resp 16   LMP 05/01/2023   SpO2 98%   Visual Acuity Right Eye Distance:   Left Eye Distance:   Bilateral Distance:    Right Eye Near:   Left Eye Near:    Bilateral Near:      Physical Exam Constitutional:      General: She is not  in acute distress.    Appearance: Normal appearance. She is not toxic-appearing or diaphoretic.  HENT:     Head: Normocephalic and atraumatic.     Right Ear: Ear canal normal. A middle ear effusion is present. Tympanic membrane is not perforated, erythematous or bulging.     Left Ear: Ear canal normal.  No middle ear effusion. Tympanic membrane is erythematous. Tympanic membrane is not perforated or bulging.     Nose: Congestion present.     Mouth/Throat:     Mouth: Mucous membranes are moist.     Pharynx: No posterior oropharyngeal erythema.  Eyes:     Extraocular Movements: Extraocular movements intact.     Conjunctiva/sclera: Conjunctivae normal.     Pupils: Pupils are equal, round, and reactive to light.  Cardiovascular:     Rate and Rhythm: Normal rate and regular rhythm.     Pulses: Normal pulses.     Heart sounds: Normal heart sounds.  Pulmonary:     Effort: Pulmonary effort is normal. No respiratory distress.     Breath sounds: Normal breath sounds. No wheezing.  Musculoskeletal:        General: Normal range of motion.     Cervical back: Normal range of motion.  Skin:    General: Skin is warm and dry.  Neurological:     General: No focal deficit present.     Mental Status: She is alert and oriented to person, place, and time. Mental status is at baseline.  Psychiatric:        Mood and Affect: Mood normal.        Behavior: Behavior normal.      UC Treatments / Results  Labs (all labs ordered are listed, but only abnormal results are displayed) Labs Reviewed - No data to display  EKG   Radiology No results found.  Procedures Procedures (including critical care time)  Medications Ordered in UC Medications - No data to display  Initial Impression / Assessment and Plan / UC Course  I have reviewed the triage vital signs and the nursing notes.  Pertinent labs & imaging results that were available  during my care of the patient were reviewed by me and considered in my medical decision making (see chart for details).     Patient presents with symptoms likely from a viral upper respiratory infection.  Do not suspect underlying cardiopulmonary process. Symptoms seem unlikely related to ACS, CHF or COPD exacerbations, pneumonia, pneumothorax. Patient is nontoxic appearing and not in need of emergent medical intervention.  Viral testing deferred given duration of symptoms as it would not change treatment.  Amoxicillin to treat left otitis media.  Recommended symptom control with over the counter medications, fluids, rest.  Return if symptoms fail to improve in 1-2 weeks or you develop shortness of breath, chest pain, severe headache. Patient states understanding and is agreeable.  Discharged with PCP followup.  Final Clinical Impressions(s) / UC Diagnoses   Final diagnoses:  Other non-recurrent acute nonsuppurative otitis media of left ear  Viral upper respiratory tract infection with cough     Discharge Instructions      I have prescribed an antibiotic for ear infection.  Cough medication also prescribed.  Follow-up if any symptoms persist or worsen.    ED Prescriptions     Medication Sig Dispense Auth. Provider   benzonatate (TESSALON) 100 MG capsule Take 1 capsule (100 mg total) by mouth every 8 (eight) hours as needed for cough. 21 capsule [place], [place] E,  FNP   amoxicillin (AMOXIL) 875 MG tablet Take 1 tablet (875 mg total) by mouth 2 (two) times daily for 7 days. 14 tablet [place], Acie Fredrickson, [place]      PDMP not reviewed this encounter.   Gustavus Bryant, [place] 05/08/23 [phone]

## 2023-05-11 ENCOUNTER — Encounter: Payer: Self-pay | Admitting: Endocrinology

## 2023-05-11 ENCOUNTER — Ambulatory Visit (INDEPENDENT_AMBULATORY_CARE_PROVIDER_SITE_OTHER): Payer: BC Managed Care – PPO | Admitting: Endocrinology

## 2023-05-11 VITALS — BP 120/70 | HR 77 | Ht 61.75 in | Wt 213.4 lb

## 2023-05-11 DIAGNOSIS — E119 Type 2 diabetes mellitus without complications: Secondary | ICD-10-CM | POA: Diagnosis not present

## 2023-05-11 DIAGNOSIS — Z794 Long term (current) use of insulin: Secondary | ICD-10-CM | POA: Diagnosis not present

## 2023-05-11 MED ORDER — METFORMIN HCL ER 500 MG PO TB24: 1000.0000 mg | ORAL_TABLET | Freq: Every day | ORAL | 3 refills | Status: DC

## 2023-05-11 MED ORDER — TIRZEPATIDE 7.5 MG/0.5ML ~~LOC~~ SOAJ
7.5000 mg | SUBCUTANEOUS | 4 refills | Status: DC
Start: 2023-05-11 — End: 2023-06-13

## 2023-05-11 NOTE — Patient Instructions (Addendum)
 Increase Mounjaro 7.5 mg weekly.  Change Metfromin XR 1000 mg daily.  Hold trial of holding lantus, restart 10 units daily if glucose > 120 persistently in the morning fasting.

## 2023-05-11 NOTE — Progress Notes (Signed)
 Outpatient Endocrinology Note [place] Ieesha Abbasi, MD   Patient's Name: Chloe Cervantes    DOB: Aug 14, 1990    MRN: [id number]                                                    REASON OF VISIT: Follow-up for type 2 diabetes mellitus  REFERRING PROVIDER: Deeann Saint, MD   PCP: Deeann Saint, MD  HISTORY OF PRESENT ILLNESS:   Chloe Cervantes is a 33 y.o. old female with past medical history listed below, is here for follow-up for type 2 diabetes mellitus.   Pertinent Diabetes History: Patient was diagnosed with type 2 diabetes mellitus in September 2022, hemoglobin A1c at the time of diagnosis was 6.8%.  Patient was taking metformin prior to diagnosis of type 2 diabetes mellitus for PCOS and she had prediabetes as well.  Patient has history of gestational diabetes in 2017.  End of 2024 patient had hyperglycemia with blood sugar up to 250 / 270 range and sometime up to 300 range postprandially and fasting blood sugar 150-200 range.  Patient has symptoms of lightheadedness with hyperglycemia.  In the January 2025 patient was started on basal insulin Lantus, 5 units daily and gradually increase to 8 units daily.  Patient has been using Dexcom G7 continuous glucose monitor as well.  History of DKA or diabetes related hospitalizations: none  Labs with negative GAD 65 antibodies and C-peptide is high which is appropriate in the setting of type 2 diabetes mellitus and obesity.   Latest Reference Range & Units 03/23/23 08:29  Glutamic Acid Decarb Ab <5 IU/mL <5  C-Peptide 0.80 - 3.85 ng/mL 4.82 (H)  (H): Data is abnormally high  Previous diabetes education: yes  Family h/o diabetes mellitus: mother with type 2 diabetes mellitus.   No personal history of pancreatitis and / or family history of medullary thyroid carcinoma or MEN 2B syndrome. Unknown about father side of family.  Chronic Diabetes Complications : Retinopathy: no. Last ophthalmology exam was done on 01/2023,  following with ophthalmology regularly.  Nephropathy: no Peripheral neuropathy: no  Coronary artery disease: no Stroke: no  Relevant comorbidities and cardiovascular risk factors: Obesity: yes Body mass index is 39.35 kg/m.  Hypertension: no Hyperlipidemia : no  Current / Home Diabetic regimen includes:  Metformin 500 mg 2 times a day. Mounjaro 5 mg weekly.   Lantus 10 units daily..  Prior diabetic medications: GI upset with higher dose of metformin.  Victoza was switched to [facility]  Glycemic data:    CONTINUOUS GLUCOSE MONITORING SYSTEM (CGMS) INTERPRETATION: At today's visit, we reviewed CGM downloads. The full report is scanned in the media. Reviewing the CGM trends, blood glucose are as follows:  Dexcom G7 CGM-  Sensor Download (Sensor download was reviewed and summarized below.) Dates: March 13 to March 26 , 2025, 14 days  Glucose Management Indicator: 6.0% Sensor Average: 112 SD 18 Sensor usage : 90 %     Interpretation: Almost all acceptable blood sugar, rare random blood sugar up to 180 range postprandially.  Blood sugar overnight and in between the meals are acceptable.  Trending down blood sugar close to low normal range in 70s in between the meals when not eating for prolonged period of the time.  No concerning hypoglycemia.  Hypoglycemia: Patient has no hypoglycemic episodes. Patient has hypoglycemia  awareness.  Factors modifying glucose control: 1.  Diabetic diet assessment: 3 meals a day.  She has been eating less carbohydrate lately.  2.  Staying active or exercising:   3.  Medication compliance: compliant all of the time.  Interval history  Dexcom G7 CGM data as reviewed above.  Almost all blood sugar in the reasonable range.  GMI 6%.  She has been tolerating Mounjaro well, no stomach issue and nausea.  Diabetes regimen as reviewed and noted above.  She lost 10 pounds of weight in 1 month after being on Mounjaro.  No immediate plan for pregnancy  however she may plan in the future.  Other complaints today.  REVIEW OF SYSTEMS As per history of present illness.   PAST MEDICAL HISTORY: Past Medical History:  Diagnosis Date   Abnormal uterine bleeding (AUB)    Arthritis    HIPS   Complication of anesthesia    hard to wake   Depression    Diabetes mellitus without complication (HCC)    Endometrial polyp    GERD (gastroesophageal reflux disease)    History of 2019 novel coronavirus disease (COVID-19) 01/2020   per pt mild symptoms that resolved   History of gestational diabetes    History of pregnancy induced hypertension    IDA (iron deficiency anemia)    PCOS (polycystic ovarian syndrome)    Prediabetes    Wears glasses     PAST SURGICAL HISTORY: Past Surgical History:  Procedure Laterality Date   HYSTEROSCOPY WITH D & C N/A 10/17/2018   Procedure: DILATION AND CURRETAGE WITH HYSTEROSCOPY;  Surgeon: Dara Lords, MD;  Location: [facility];  Service: Gynecology;  Laterality: N/A;   HYSTEROSCOPY WITH D & C N/A 08/12/2020   Procedure: DILATATION AND CURETTAGE /HYSTEROSCOPY;  Surgeon: Romualdo Bolk, MD;  Location: [facility];  Service: Gynecology;  Laterality: N/A;   LAPAROSCOPY Left 10/17/2018   Procedure: LAPAROSCOPIC  REMOVAL OF PARATUBAL CYST;  Surgeon: Dara Lords, MD;  Location: [facility];  Service: Gynecology;  Laterality: Left;   WISDOM TOOTH EXTRACTION     TEEN    ALLERGIES: Allergies  Allergen Reactions   Metronidazole Rash    FAMILY HISTORY:  Family History  Problem Relation Age of Onset   Diabetes Mother    Hypertension Mother    Hyperlipidemia Mother     SOCIAL HISTORY: Social History   Socioeconomic History   Marital status: Married    Spouse name: Not on file   Number of children: Not on file   Years of education: Not on file   Highest education level: Associate degree: occupational, Scientist, product/process development, or vocational program   Occupational History   Not on file  Tobacco Use   Smoking status: Never   Smokeless tobacco: Never  Vaping Use   Vaping status: Never Used  Substance and Sexual Activity   Alcohol use: Yes    Comment: Occasional   Drug use: Never   Sexual activity: Yes    Birth control/protection: Condom    Comment: 1st intercourse 33 yo-More than 5 partners  Other Topics Concern   Not on file  Social History Narrative   Not on file   Social Drivers of Health   Financial Resource Strain: Low Risk  (03/22/2023)   Overall Financial Resource Strain (CARDIA)    Difficulty of Paying Living Expenses: Not hard at all  Food Insecurity: No Food Insecurity (03/22/2023)   Hunger Vital Sign    Worried  About Running Out of Food in the Last Year: Never true    Ran Out of Food in the Last Year: Never true  Transportation Needs: No Transportation Needs (03/22/2023)   PRAPARE - Administrator, Civil Service (Medical): No    Lack of Transportation (Non-Medical): No  Physical Activity: Unknown (03/22/2023)   Exercise Vital Sign    Days of Exercise per Week: Patient declined    Minutes of Exercise per Session: 10 min  Stress: No Stress Concern Present (03/22/2023)   [employer] of Occupational Health - Occupational Stress Questionnaire    Feeling of Stress : Only a little  Social Connections: Moderately Isolated (03/22/2023)   Social Connection and Isolation Panel [NHANES]    Frequency of Communication with Friends and Family: More than three times a week    Frequency of Social Gatherings with Friends and Family: Once a week    Attends Religious Services: Never    Database administrator or Organizations: No    Attends Engineer, structural: Not on file    Marital Status: Married    MEDICATIONS:  Current Outpatient Medications  Medication Sig Dispense Refill   amoxicillin (AMOXIL) 875 MG tablet Take 1 tablet (875 mg total) by mouth 2 (two) times daily for 7 days. 14 tablet 0    benzonatate (TESSALON) 100 MG capsule Take 1 capsule (100 mg total) by mouth every 8 (eight) hours as needed for cough. 21 capsule 0   blood glucose meter kit and supplies KIT Dispense based on patient and insurance preference. Use up to four times daily as directed. 1 each 0   Blood Glucose Monitoring Suppl (ONE TOUCH ULTRA 2) w/Device KIT 1 each by Does not apply route daily. 1 kit 2   Continuous Glucose Sensor (DEXCOM G7 SENSOR) MISC USE AS DIRECTED--- CHANGE EVERY 10 DAYS 2 each 6   glucose blood test strip 1 each by Other route daily. One Touch Ultra 100 each 12   insulin glargine (LANTUS SOLOSTAR) 100 UNIT/ML Solostar Pen Inject 10 Units into the skin at bedtime. 15 mL 2   Insulin Pen Needle 32G X 4 MM MISC To use with Victoza Daily 30 each 3   Lancets (ONETOUCH DELICA PLUS LANCET33G) MISC 1 EACH BY OTHER ROUTE DAILY. E11.69 100 each 2   metFORMIN (GLUCOPHAGE-XR) 500 MG 24 hr tablet Take 2 tablets (1,000 mg total) by mouth daily with breakfast. 180 tablet 3   norethindrone-ethinyl estradiol-FE (LOESTRIN FE) 1-20 MG-MCG tablet Take 1 tablet by mouth daily. 28 tablet 11   tirzepatide (MOUNJARO) 7.5 MG/0.5ML Pen Inject 7.5 mg into the skin once a week. 2 mL 4   No current facility-administered medications for this visit.    PHYSICAL EXAM: Vitals:   05/11/23 0850  BP: 120/70  Pulse: 77  SpO2: 99%  Weight: 213 lb 6.4 oz (96.8 kg)  Height: 5' 1.75" (1.568 m)   Body mass index is 39.35 kg/m.  Wt Readings from Last 3 Encounters:  05/11/23 213 lb 6.4 oz (96.8 kg)  04/28/23 218 lb (98.9 kg)  04/04/23 223 lb 6.4 oz (101.3 kg)    General: Well developed, well nourished female in no apparent distress.  HEENT: AT/[place], no external lesions.  Eyes: Conjunctiva clear and no icterus. Neck: Neck supple  Lungs: Respirations not labored Neurologic: Alert, oriented, normal speech Extremities / Skin: Dry. No sores or rashes noted. + acanthosis nigricans Psychiatric: Does not appear depressed or  anxious   Diabetic Foot Exam -  Simple   No data filed     LABS Reviewed Lab Results  Component Value Date   HGBA1C 7.2 (A) 03/16/2023   HGBA1C 6.4 03/22/2022   HGBA1C 6.2 (H) 08/27/2021   No results found for: "FRUCTOSAMINE" Lab Results  Component Value Date   CHOL 176 03/22/2022   HDL 59.70 03/22/2022   LDLCALC 103 (H) 03/22/2022   TRIG 64.0 03/22/2022   CHOLHDL 3 03/22/2022   No results found for: "MICRALBCREAT" Lab Results  Component Value Date   CREATININE 0.52 03/21/2023   Lab Results  Component Value Date   GFR 122.76 03/22/2022    ASSESSMENT / PLAN  1. Type 2 diabetes mellitus without complication, with long-term current use of insulin (HCC)     Diabetes Mellitus type 2, complicated by no known complications. - Diabetic status / severity: Uncontrolled.  Lab Results  Component Value Date   HGBA1C 7.2 (A) 03/16/2023    - Hemoglobin A1c goal : <6.5%  She has improvement of diabetes control, GMI 6%.  Almost all acceptable blood sugar on CGM.  - Medications: See below.  -Increase Mounjaro from 5 mg to 7.5 mg weekly.  Patient is asked to call in a month if no GI issues will increase to 10 mg weekly. -Change metformin from 500 mg 2 times a day to metformin extended release 1000 mg daily. -Trial of holding Lantus.  Restart 10 units daily if the blood sugar is more than 120 persistently in the morning fasting.   Patient is recommended to use birth control method, and avoid pregnancy while taking  Mounjaro.  Just in case if she gets pregnant or actively trying to get pregnant, advised to stop Mounjaro immediately.  - Home glucose testing: Continue Dexcom G7 and check as needed. - Discussed/ Gave Hypoglycemia treatment plan.  # Consult : not required at this time.    # Annual urine for microalbuminuria/ creatinine ratio, no microalbuminuria currently, will check in the future visit.  Not able to provide sample today. Last No results found for:  "MICRALBCREAT"  # Foot check nightly.  # Annual dilated diabetic eye exams.   - Diet: Make healthy diabetic food choices, discussed in detail. - Life style / activity / exercise: Discussed.  2. Blood pressure  -  BP Readings from Last 1 Encounters:  05/11/23 120/70    - Control is in target.  - No change in current plans.  3. Lipid status / Hyperlipidemia - Last  Lab Results  Component Value Date   LDLCALC 103 (H) 03/22/2022   -Currently not on a statin.  Not indicated.    Diagnoses and all orders for this visit:  Type 2 diabetes mellitus without complication, with long-term current use of insulin (HCC) -     tirzepatide (MOUNJARO) 7.5 MG/0.5ML Pen; Inject 7.5 mg into the skin once a week. -     metFORMIN (GLUCOPHAGE-XR) 500 MG 24 hr tablet; Take 2 tablets (1,000 mg total) by mouth daily with breakfast.     DISPOSITION Follow up in clinic in 3 months suggested.   All questions answered and patient verbalized understanding of the plan.  [place] Keyana Guevara, MD [facility] Endocrinology [facility] [address] [place], [address] [place], [place] [address] Phone # [phone]  At least part of this note was generated using voice recognition software. Inadvertent word errors may have occurred, which were not recognized during the proofreading process.

## 2023-05-13 ENCOUNTER — Ambulatory Visit: Payer: BC Managed Care – PPO | Admitting: Radiology

## 2023-05-23 ENCOUNTER — Encounter: Payer: BC Managed Care – PPO | Admitting: Obstetrics and Gynecology

## 2023-05-31 ENCOUNTER — Ambulatory Visit (INDEPENDENT_AMBULATORY_CARE_PROVIDER_SITE_OTHER): Payer: BC Managed Care – PPO | Admitting: Obstetrics and Gynecology

## 2023-05-31 ENCOUNTER — Encounter: Payer: Self-pay | Admitting: Obstetrics and Gynecology

## 2023-05-31 VITALS — BP 112/79 | HR 91 | Ht 61.0 in | Wt 210.0 lb

## 2023-05-31 DIAGNOSIS — Z3189 Encounter for other procreative management: Secondary | ICD-10-CM | POA: Diagnosis not present

## 2023-05-31 DIAGNOSIS — Z01419 Encounter for gynecological examination (general) (routine) without abnormal findings: Secondary | ICD-10-CM | POA: Diagnosis not present

## 2023-05-31 DIAGNOSIS — F419 Anxiety disorder, unspecified: Secondary | ICD-10-CM | POA: Diagnosis not present

## 2023-05-31 DIAGNOSIS — Z1339 Encounter for screening examination for other mental health and behavioral disorders: Secondary | ICD-10-CM

## 2023-05-31 NOTE — Progress Notes (Signed)
 33 y.o. New GYN presents for AEX. Pt is trying to conceive for the past 6 yrs, and has a HO PCOS.

## 2023-05-31 NOTE — Patient Instructions (Signed)
 It was nice meeting you today! Please keep track of your bleeding in the Health app for the next 3 months so we can figure out your bleeding pattern.   Start taking a prenatal vitamin with folic acid/folate

## 2023-05-31 NOTE — Progress Notes (Signed)
 ANNUAL EXAM Patient name: Chloe Cervantes MRN [id number]  Date of birth: 10-Jul-1990 Chief Complaint:   NEW PATIENT/GYN  History of Present Illness:   Chloe Cervantes is a 33 y.o. [id number] with Patient's last menstrual period was 05/01/2023 (approximate). being seen today for a routine annual exam.  Current complaints:   Note written with assistance from AI scribe software after verbal consent obtained from patient.   TTC, would like to get pregnant in the next year.   Hx PCOS diagnosed in 2017. Irregular menstrual cycles with only one period occurring in the past six months. Her periods are typically heavy with large blood clots, and she experiences extreme cramps for three days. Not using any contraception/medications. Tried to conceive with letrozole  in the past  Hx T2DM. Currently on Mounjaro, which has helped her lose 17 pounds in two months and improve blood sugars. Was able to come off insulin . Continues on metformin .    Hx severe anxiety - Attributes to recent life stressors. Seeing a therapist weekly. Not on medication for anxiety due to difficulty with pill adherence.  She has a history of ovarian cysts, with a significant cyst removed from her left fallopian tube in the past. She reports occasional abdominal pain related to cysts rupturing. An ultrasound last year showed no cysts.  Hx T2DM, PCOS. On Mounjaro.    Upstream - 06/05/23 1725       Pregnancy Intention Screening   Does the patient want to become pregnant in the next year? Yes    Does the patient's partner want to become pregnant in the next year? Yes      Contraception Wrap Up   Current Method No Contraceptive Precautions    End Method No Contraception Precautions    Contraception Counseling Provided No    How was the end contraceptive method provided? N/A            The pregnancy intention screening data noted above was reviewed. Potential methods of contraception were discussed. The  patient elected to proceed with No Contraception Precautions.   Last pap 04/14/21. Results were:  NILM . H/O abnormal pap: no Last mammogram: n/a. Results were: N/A. Family h/o breast cancer: unknown - mother was adopted and has passed away, father not involved (assault) Last colonoscopy: n/a. Results were: N/A. Family h/o colorectal cancer: as above HPV vaccine: Completed per patient report     05/31/2023    8:39 AM 04/28/2023    2:46 PM 04/04/2023    3:52 PM 10/20/2022   10:17 AM 03/22/2022    1:18 PM  Depression screen PHQ 2/9  Decreased Interest 1 0 0 0 1  Down, Depressed, Hopeless 1 0 1 2 1   PHQ - 2 Score 2 0 1 2 2   Altered sleeping 0  0 2 1  Tired, decreased energy 2  2 3 1   Change in appetite 0  0 3 0  Feeling bad or failure about yourself  0  0 3 0  Trouble concentrating 0  0 0 0  Moving slowly or fidgety/restless 0  0 0 0  Suicidal thoughts 0  0 0 0  PHQ-9 Score 4  3 13 4   Difficult doing work/chores Not difficult at all  Not difficult at all Not difficult at all Not difficult at all        05/31/2023    8:40 AM 04/04/2023    3:53 PM 10/20/2022   10:17 AM 03/22/2022    1:19 PM  GAD  7 : Generalized Anxiety Score  Nervous, Anxious, on Edge 3 3 2 1   Control/stop worrying 3 2 3 1   Worry too much - different things 3 3 3 1   Trouble relaxing 0 0 0 0  Restless 0 0 0 0  Easily annoyed or irritable 2 3 2 1   Afraid - awful might happen 3 3 3 1   Total GAD 7 Score 14 14 13 5   Anxiety Difficulty Somewhat difficult Somewhat difficult Not difficult at all Not difficult at all   Review of Systems:   Pertinent items are noted in HPI Denies any headaches, blurred vision, fatigue, shortness of breath, chest pain, abdominal pain, abnormal vaginal discharge/itching/odor/irritation, problems with periods, bowel movements, urination, or intercourse unless otherwise stated above. Pertinent History Reviewed:  Reviewed past medical,surgical, social and family history.  Reviewed problem list,  medications and allergies. Physical Assessment:   Vitals:   05/31/23 0828  BP: 112/79  Pulse: 91  Weight: 210 lb (95.3 kg)  Height: 5\' 1"  (1.549 m)  Body mass index is 39.68 kg/m.        Physical Examination:   General appearance - well appearing, and in no distress  Mental status - alert, oriented to person, place, and time  Chest - respiratory effort normal  Heart - normal peripheral perfusion  Breasts - breasts appear normal, no suspicious masses, no skin or nipple changes or axillary nodes  Abdomen - soft, nontender, nondistended, no masses or organomegaly  Pelvic - Deferred after discussion of r/b  Chaperone present for exam  No results found for this or any previous visit (from the past 24 hours).  Assessment & Plan:  1) Well-Woman Exam Mammogram: @ 31yo, or sooner if problems Colonoscopy: @ 33yo, or sooner if problems Pap: Due 03/2024 Gardasil: Completed per patient report GC/CT: Declines for now HIV/HCV: Declines for now  2) Preconception counseling/PCOS - Start PNV - Discussed weight loss may improve cycle regularity. Mounjaro is effective for weight loss but there is not enough safety data to continue during pregnancy - If weight loss is not effective to resume ovulation, can consider letrozole  - Pt will continue Mounjaro & track cycles with health app for three months - If no cycle in > 12 weeks, call for provera  rx for endometrial protection  - RTC in 3 months to follow up cycles and next steps   3) Anxiety Continue working with therapist Discussed option for SSRI if needed - she declines for now  Follow-up: Return in about 3 months (around 08/30/2023) for follow up irregular periods.  Total encounter time: 35 minutes  Izell Marsh, MD 06/05/2023 5:25 PM

## 2023-06-10 ENCOUNTER — Encounter: Payer: Self-pay | Admitting: Endocrinology

## 2023-06-10 DIAGNOSIS — E119 Type 2 diabetes mellitus without complications: Secondary | ICD-10-CM

## 2023-06-13 ENCOUNTER — Other Ambulatory Visit: Payer: Self-pay

## 2023-06-13 MED ORDER — TIRZEPATIDE 10 MG/0.5ML ~~LOC~~ SOAJ: 10.0000 mg | SUBCUTANEOUS | 3 refills | Status: DC

## 2023-06-13 NOTE — Telephone Encounter (Signed)
 Sent prescription for Mounjaro 10 mg weekly.

## 2023-06-29 ENCOUNTER — Encounter: Payer: Self-pay | Admitting: Skilled Nursing Facility1

## 2023-06-29 ENCOUNTER — Ambulatory Visit: Admitting: Skilled Nursing Facility1

## 2023-06-29 ENCOUNTER — Encounter: Attending: Family Medicine | Admitting: Skilled Nursing Facility1

## 2023-06-29 VITALS — Ht 61.0 in | Wt 211.0 lb

## 2023-06-29 DIAGNOSIS — E1169 Type 2 diabetes mellitus with other specified complication: Secondary | ICD-10-CM | POA: Insufficient documentation

## 2023-06-29 NOTE — Progress Notes (Signed)
 [employer] Employee   DM: Mounjaro Metformin  Lantus : no longer taking   Dexcom  Other Dx: Depression GERD Arthritis Anemia  PCOS  Pt arrives having made a lot of lifestyle changes and doing very well.   Pt states she has had DM for about 3 or 4 years. Pt states she has not been wearing her CGM but does finger sticks sometimes.   Pt states she as been working out recently stating she has been walking while her kids do their sports and at work has ben standing and moving more at work and at home after her kids activities has been doing isometric type workouts with her sister in Social worker.   Pt states she has not been eating super late and has been trying to clean up a bit before bed. Pt stets she has not been checking her weight every day. Pt states she can see the difference. Pt states she only drinks water and has cut back on sweets. No longer feeling extreme. Pt states she has cut her portions in half and her husband has been extremely supportive.     24 hr recall: First meal: bagel Second meal: cafeteria or cava Third meal: nuts and mango  Diabetes Self-Management Education  Visit Type: Follow-up  Appt. Start Time: 7:31 Appt. End Time: 8:01  06/29/2023  Ms. Henriette Lofty, identified by name and date of birth, is a 33 y.o. female with a diagnosis of Diabetes:  .   ASSESSMENT  Height 5\' 1"  (1.549 m), weight 211 lb (95.7 kg), last menstrual period 05/01/2023. Body mass index is 39.87 kg/m.   Diabetes Self-Management Education - 06/29/23 0811       Visit Information   Visit Type Follow-up      Health Coping   How would you rate your overall health? Excellent      Psychosocial Assessment   Patient Belief/Attitude about Diabetes Motivated to manage diabetes    What is the hardest part about your diabetes right now, causing you the most concern, or is the most worrisome to you about your diabetes?   Being active    Self-care barriers None    Self-management support  Family    Other persons present Spouse/SO    Patient Concerns Nutrition/Meal planning;Healthy Lifestyle    Special Needs None    Preferred Learning Style Auditory;Visual    Learning Readiness Change in progress    How often do you need to have someone help you when you read instructions, pamphlets, or other written materials from your doctor or pharmacy? 1 - Never      Pre-Education Assessment   Patient understands the diabetes disease and treatment process. Demonstrates understanding / competency    Patient understands incorporating nutritional management into lifestyle. Demonstrates understanding / competency    Patient undertands incorporating physical activity into lifestyle. Demonstrates understanding / competency    Patient understands using medications safely. Demonstrates understanding / competency    Patient understands monitoring blood glucose, interpreting and using results Demonstrates understanding / competency    Patient understands prevention, detection, and treatment of acute complications. Demonstrates understanding / competency    Patient understands prevention, detection, and treatment of chronic complications. Demonstrates understanding / competency    Patient understands how to develop strategies to address psychosocial issues. Demonstrates understanding / competency    Patient understands how to develop strategies to promote health/change behavior. Demonstrates understanding / competency      Complications   Last HgB A1C per patient/outside source 7.2 %  How often do you check your blood sugar? 0 times/day (not testing)    Number of hyperglycemic episodes ( >200mg /dL): Never    Have you had a dilated eye exam in the past 12 months? Yes    Have you had a dental exam in the past 12 months? Yes    Are you checking your feet? Yes      Activity / Exercise   Activity / Exercise Type Light (walking / raking leaves)    How many days per week do you exercise? 3    How  many minutes per day do you exercise? 30    Total minutes per week of exercise 90      Patient Education   Previous Diabetes Education Yes (please comment)    Healthy Eating Role of diet in the treatment of diabetes and the relationship between the three main macronutrients and blood glucose level;Food label reading, portion sizes and measuring food.;Plate Method;Carbohydrate counting;Reviewed blood glucose goals for pre and post meals and how to evaluate the patients' food intake on their blood glucose level.    Being Active Role of exercise on diabetes management, blood pressure control and cardiac health.;Helped patient identify appropriate exercises in relation to his/her diabetes, diabetes complications and other health issue.      Individualized Goals (developed by patient)   Nutrition Follow meal plan discussed;General guidelines for healthy choices and portions discussed    Physical Activity Exercise 5-7 days per week;60 minutes per day    Medications Not Applicable      Patient Self-Evaluation of Goals - Patient rates self as meeting previously set goals (% of time)   Nutrition >75% (most of the time)    Physical Activity >75% (most of the time)    Medications >75% (most of the time)    Monitoring >75% (most of the time)    Problem Solving and behavior change strategies  >75% (most of the time)    Reducing Risk (treating acute and chronic complications) >75% (most of the time)    Health Coping >75% (most of the time)      Post-Education Assessment   Patient understands the diabetes disease and treatment process. Demonstrates understanding / competency    Patient understands incorporating nutritional management into lifestyle. Demonstrates understanding / competency    Patient undertands incorporating physical activity into lifestyle. Demonstrates understanding / competency    Patient understands using medications safely. Demonstrates understanding / competency    Patient  understands monitoring blood glucose, interpreting and using results Demonstrates understanding / competency    Patient understands prevention, detection, and treatment of acute complications. Demonstrates understanding / competency    Patient understands prevention, detection, and treatment of chronic complications. Demonstrates understanding / competency    Patient understands how to develop strategies to address psychosocial issues. Demonstrates understanding / competency    Patient understands how to develop strategies to promote health/change behavior. Demonstrates understanding / competency      Outcomes   Expected Outcomes Demonstrated interest in learning. Expect positive outcomes    Future DMSE 3-4 months    Program Status Completed      Subsequent Visit   Since your last visit have you continued or begun to take your medications as prescribed? Not on Medications    Since your last visit have you had your blood pressure checked? No    Since your last visit have you experienced any weight changes? Loss    Weight Loss (lbs) 7    Since your  last visit, are you checking your blood glucose at least once a day? No             Individualized Plan for Diabetes Self-Management Training:   Learning Objective:  Patient will have a greater understanding of diabetes self-management. Patient education plan is to attend individual and/or group sessions per assessed needs and concerns.    Education material provided: Diabetes Resources  If problems or questions, patient to contact team via:  Phone and Email  Future DSME appointment: 3-4 months

## 2023-07-08 ENCOUNTER — Encounter: Payer: Self-pay | Admitting: Family Medicine

## 2023-07-08 ENCOUNTER — Ambulatory Visit (INDEPENDENT_AMBULATORY_CARE_PROVIDER_SITE_OTHER): Payer: BC Managed Care – PPO | Admitting: Family Medicine

## 2023-07-08 VITALS — BP 114/72 | HR 85 | Temp 97.8°F | Ht 61.0 in | Wt 213.2 lb

## 2023-07-08 DIAGNOSIS — E119 Type 2 diabetes mellitus without complications: Secondary | ICD-10-CM

## 2023-07-08 DIAGNOSIS — Z6841 Body Mass Index (BMI) 40.0 and over, adult: Secondary | ICD-10-CM

## 2023-07-08 DIAGNOSIS — F419 Anxiety disorder, unspecified: Secondary | ICD-10-CM

## 2023-07-08 DIAGNOSIS — L740 Miliaria rubra: Secondary | ICD-10-CM

## 2023-07-08 DIAGNOSIS — E66813 Obesity, class 3: Secondary | ICD-10-CM

## 2023-07-08 DIAGNOSIS — L80 Vitiligo: Secondary | ICD-10-CM

## 2023-07-08 DIAGNOSIS — L918 Other hypertrophic disorders of the skin: Secondary | ICD-10-CM | POA: Diagnosis not present

## 2023-07-08 DIAGNOSIS — Z794 Long term (current) use of insulin: Secondary | ICD-10-CM

## 2023-07-08 NOTE — Progress Notes (Addendum)
 Established Patient Office Visit   Subjective  Patient ID: Chloe Cervantes, female    DOB: 1990/12/16  Age: 33 y.o. MRN: [id number]  Chief Complaint  Patient presents with   Medical Management of Chronic Issues    3 month follow-up. Diabetic     Patient is a 33 year old female seen for follow-up.   Pt states vitiligo seems to be getting worse faster on face, hands, arms, groin, and thighs.  Now getting sunburned faster even if using sunscreen and protective clothing.  Also seeing fine, pruritic bumps on forearms and chest.  Followed by Dermatology.  Hoping to avoid steroids as increases anxiety.  Inquires about other options.  Endorses skin tags in b/l axilla and on L neck.  Considering OTC freeze spray for removal.  Seen by new OB/Gyn, advised may not have PCOS after all.  Pt having inconsistent/no period x 4 yrs.  Pt and her husband hoping to try having another child.  Pt also followed by Endo.  Lantus  d/c'd 2/2 am lows.  Now on Mounjaro.  Endorses wt loss.  Am bs now 120s.   Anxiety increased at times.  Pt states her daughter Jim Motts was recently dx'd with anxiety.    Patient Active Problem List   Diagnosis Date Noted   Vitiligo 04/10/2023   Rosacea 04/10/2023   Hepatic steatosis 01/15/2022   Vitamin D  deficiency 01/15/2022   Type 2 diabetes mellitus with other specified complication (HCC) 04/20/2021   Anemia 04/14/2021   Femoral acetabular impingement 02/29/2020   PCOS (polycystic ovarian syndrome) 04/07/2018   Anxiety and depression 04/07/2018   Iron deficiency anemia 04/07/2018   Obesity 09/21/2017   Abnormal uterine bleeding (AUB) 01/21/2017   Past Medical History:  Diagnosis Date   Abnormal uterine bleeding (AUB)    Anxiety    Arthritis    HIPS   Complication of anesthesia    hard to wake   Depression    Diabetes mellitus without complication (HCC)    Endometrial polyp    GERD (gastroesophageal reflux disease)    Gestational diabetes    History of 2019 novel  coronavirus disease (COVID-19) 01/2020   per pt mild symptoms that resolved   History of gestational diabetes    History of pregnancy induced hypertension    IDA (iron deficiency anemia)    PCOS (polycystic ovarian syndrome)    Prediabetes    Wears glasses    Past Surgical History:  Procedure Laterality Date   HYSTEROSCOPY WITH D & C N/A 10/17/2018   Procedure: DILATION AND CURRETAGE WITH HYSTEROSCOPY;  Surgeon: Lacretia Piccolo, MD;  Location: [facility];  Service: Gynecology;  Laterality: N/A;   HYSTEROSCOPY WITH D & C N/A 08/12/2020   Procedure: DILATATION AND CURETTAGE /HYSTEROSCOPY;  Surgeon: Jertson, Jill Evelyn, MD;  Location: [facility] [place];  Service: Gynecology;  Laterality: N/A;   LAPAROSCOPY Left 10/17/2018   Procedure: LAPAROSCOPIC  REMOVAL OF PARATUBAL CYST;  Surgeon: Lacretia Piccolo, MD;  Location: [facility];  Service: Gynecology;  Laterality: Left;   WISDOM TOOTH EXTRACTION     TEEN   Social History   Tobacco Use   Smoking status: Never   Smokeless tobacco: Never  Vaping Use   Vaping status: Never Used  Substance Use Topics   Alcohol use: Yes    Comment: Occasional   Drug use: Never   Family History  Problem Relation Age of Onset   Diabetes Mother    Hypertension Mother  Hyperlipidemia Mother    Arthritis Mother    Miscarriages / [place] Mother    Obesity Mother    Allergies  Allergen Reactions   Metronidazole  Rash    ROS Negative unless stated above    Objective:      BP 114/72 (BP Location: Left Arm, Patient Position: Sitting, Cuff Size: Normal)   Pulse 85   Temp 97.8 F (36.6 C) (Oral)   Ht 5\' 1"  (1.549 m)   Wt 213 lb 3.2 oz (96.7 kg)   LMP  (LMP Unknown)   SpO2 98%   BMI 40.28 kg/m  BP Readings from Last 3 Encounters:  07/08/23 114/72  05/31/23 112/79  05/11/23 120/70   Wt Readings from Last 3 Encounters:  07/08/23 213 lb 3.2 oz (96.7 kg)  06/29/23 211 lb (95.7 kg)   05/31/23 210 lb (95.3 kg)      Physical Exam Constitutional:      General: She is not in acute distress.    Appearance: Normal appearance.  HENT:     Head: Normocephalic and atraumatic.     Nose: Nose normal.     Mouth/Throat:     Mouth: Mucous membranes are moist.  Cardiovascular:     Rate and Rhythm: Normal rate and regular rhythm.     Heart sounds: Normal heart sounds. No murmur heard.    No gallop.  Pulmonary:     Effort: Pulmonary effort is normal. No respiratory distress.     Breath sounds: Normal breath sounds. No wheezing, rhonchi or rales.  Skin:    General: Skin is warm and dry.     Comments: Hypopigmentation of skin of face, dorsum of hands, fingers, forearms, chest, legs. Fine skin colored papules on b/l foreams and chest with rough feeling.  Neurological:     Mental Status: She is alert and oriented to person, place, and time.        07/08/2023    4:39 PM 05/31/2023    8:39 AM 04/28/2023    2:46 PM  Depression screen PHQ 2/9  Decreased Interest 0 1 0  Down, Depressed, Hopeless 1 1 0  PHQ - 2 Score 1 2 0  Altered sleeping 0 0   Tired, decreased energy 1 2   Change in appetite 0 0   Feeling bad or failure about yourself  0 0   Trouble concentrating 0 0   Moving slowly or fidgety/restless 0 0   Suicidal thoughts 0 0   PHQ-9 Score 2 4   Difficult doing work/chores Not difficult at all Not difficult at all       07/08/2023    4:39 PM 05/31/2023    8:40 AM 04/04/2023    3:53 PM 10/20/2022   10:17 AM  GAD 7 : Generalized Anxiety Score  Nervous, Anxious, on Edge 2 3 3 2   Control/stop worrying 2 3 2 3   Worry too much - different things 2 3 3 3   Trouble relaxing 1 0 0 0  Restless 0 0 0 0  Easily annoyed or irritable 2 2 3 2   Afraid - awful might happen 3 3 3 3   Total GAD 7 Score 12 14 14 13   Anxiety Difficulty Not difficult at all Somewhat difficult Somewhat difficult Not difficult at all     No results found for any visits on 07/08/23.    Assessment  & Plan:   Vitiligo  Anxiety  Type 2 diabetes mellitus without complication, with long-term current use of insulin  (HCC)  Skin  tags, multiple acquired  Class 3 severe obesity with serious comorbidity and body mass index (BMI) of 40.0 to 44.9 in adult, unspecified obesity type  Heat rash   Increase in vitiligo.  Discussed possible treatment options other than steroids such as calcineurin inhibitor, light therapy, JAK inhibitor (Opzelura ).  Advised against JAK inhibitor at this time 2/2 possible s/e and as pt hoping to conceive.  Light therapy an option though time intensive 1 hr per session several x per wk.  Use precautions when in sun such as protective clothing, umbrella, sunscreen reapplied often.    Heat rash noted on b/l forearms.  Limit sun exposure.  Soothing skin products such as aloe vera.  DM 2 stable.  Last hgb A1C 7.5% on 03/16/23.  Continue f/u with Endo, Dr. Aretha Kubas.  Continue mounjaro 10 mg wkly. Schedule eye exam.    Anxiety stable.  GAD 7 score 12, previously 14. PHQ 9 score 2 previously 4.  Consider restarting counseling.  Medication options also available.  Body mass index is 40.28 kg/m.  Continue lifestyle modifications.  Continue mounjaro.   On day of service, 33 minutes spent caring for this patient face-to-face, reviewing the chart, counseling and/or coordinating care for plan and treatment of diagnosis below.     Return for 3-4 months, chronic conditions.   Viola Greulich, MD

## 2023-07-13 ENCOUNTER — Encounter: Payer: Self-pay | Admitting: Family Medicine

## 2023-07-15 ENCOUNTER — Other Ambulatory Visit: Payer: Self-pay | Admitting: Family Medicine

## 2023-07-15 DIAGNOSIS — L80 Vitiligo: Secondary | ICD-10-CM

## 2023-07-15 DIAGNOSIS — L719 Rosacea, unspecified: Secondary | ICD-10-CM

## 2023-07-21 ENCOUNTER — Other Ambulatory Visit: Payer: Self-pay | Admitting: Family Medicine

## 2023-07-21 DIAGNOSIS — E1169 Type 2 diabetes mellitus with other specified complication: Secondary | ICD-10-CM

## 2023-07-21 DIAGNOSIS — E282 Polycystic ovarian syndrome: Secondary | ICD-10-CM

## 2023-08-04 ENCOUNTER — Ambulatory Visit (INDEPENDENT_AMBULATORY_CARE_PROVIDER_SITE_OTHER): Admitting: Endocrinology

## 2023-08-04 ENCOUNTER — Encounter: Payer: Self-pay | Admitting: Endocrinology

## 2023-08-04 ENCOUNTER — Ambulatory Visit: Payer: Self-pay | Admitting: Endocrinology

## 2023-08-04 VITALS — BP 128/84 | HR 82 | Ht 61.0 in | Wt 208.0 lb

## 2023-08-04 DIAGNOSIS — E282 Polycystic ovarian syndrome: Secondary | ICD-10-CM

## 2023-08-04 DIAGNOSIS — Z794 Long term (current) use of insulin: Secondary | ICD-10-CM

## 2023-08-04 DIAGNOSIS — Z7985 Long-term (current) use of injectable non-insulin antidiabetic drugs: Secondary | ICD-10-CM

## 2023-08-04 DIAGNOSIS — E119 Type 2 diabetes mellitus without complications: Secondary | ICD-10-CM | POA: Diagnosis not present

## 2023-08-04 DIAGNOSIS — E1169 Type 2 diabetes mellitus with other specified complication: Secondary | ICD-10-CM

## 2023-08-04 LAB — POCT GLYCOSYLATED HEMOGLOBIN (HGB A1C): Hemoglobin A1C: 5.4 % (ref 4.0–5.6)

## 2023-08-04 MED ORDER — DEXCOM G7 SENSOR MISC: 1.0000 | 4 refills | Status: DC

## 2023-08-04 MED ORDER — METFORMIN HCL ER 500 MG PO TB24: 1000.0000 mg | ORAL_TABLET | Freq: Every day | ORAL | 3 refills | Status: DC

## 2023-08-04 MED ORDER — TIRZEPATIDE 12.5 MG/0.5ML ~~LOC~~ SOAJ: 12.5000 mg | SUBCUTANEOUS | 4 refills | Status: DC

## 2023-08-04 NOTE — Progress Notes (Signed)
 Outpatient Endocrinology Note [place] Nga Rabon, MD   Patient's Name: Chloe Cervantes    DOB: 12-05-1990    MRN: [id number]                                                    REASON OF VISIT: Follow-up for type 2 diabetes mellitus  REFERRING PROVIDER: Mercer Clotilda SAUNDERS, MD   PCP: Mercer Clotilda SAUNDERS, MD  HISTORY OF PRESENT ILLNESS:   Chloe Cervantes is a 33 y.o. old female with past medical history listed below, is here for follow-up for type 2 diabetes mellitus.   Pertinent Diabetes History: Patient was diagnosed with type 2 diabetes mellitus in September 2022, hemoglobin A1c at the time of diagnosis was 6.8%.  Patient was taking metformin  prior to diagnosis of type 2 diabetes mellitus for PCOS and she had prediabetes as well.  Patient has history of gestational diabetes in 2017.  End of 2024 patient had hyperglycemia with blood sugar up to 250 / 270 range and sometime up to 300 range postprandially and fasting blood sugar 150-200 range.  Patient has symptoms of lightheadedness with hyperglycemia.  In the January 2025 patient was started on basal insulin  Lantus , 5 units daily and gradually increase to 8 units daily.  Patient has been using Dexcom G7 continuous glucose monitor as well.  History of DKA or diabetes related hospitalizations: none  Labs with negative GAD 65 antibodies and C-peptide is high which is appropriate in the setting of type 2 diabetes mellitus and obesity.   Latest Reference Range & Units 03/23/23 08:29  Glutamic Acid Decarb Ab <5 IU/mL <5  C-Peptide 0.80 - 3.85 ng/mL 4.82 (H)  (H): Data is abnormally high  Previous diabetes education: yes  Family h/o diabetes mellitus: mother with type 2 diabetes mellitus.   No personal history of pancreatitis and / or family history of medullary thyroid  carcinoma or MEN 2B syndrome. Unknown about father side of family.  Chronic Diabetes Complications : Retinopathy: no. Last ophthalmology exam was done on 01/2023,  following with ophthalmology regularly.  Nephropathy: no Peripheral neuropathy: no  Coronary artery disease: no Stroke: no  Relevant comorbidities and cardiovascular risk factors: Obesity: yes Body mass index is 39.3 kg/m.  Hypertension: no Hyperlipidemia : no  Current / Home Diabetic regimen includes:  Metformin  500 mg 2 times a day. Mounjaro  10 mg weekly.    Prior diabetic medications: GI upset with higher dose of metformin .  Victoza  was switched to Mounjaro .  Lantus  stopped after improvement of diabetes.  Glycemic data:    CONTINUOUS GLUCOSE MONITORING SYSTEM (CGMS) INTERPRETATION: At today's visit, we reviewed CGM downloads. The full report is scanned in the media. Reviewing the CGM trends, blood glucose are as follows:  Dexcom G7 CGM-  Sensor Download (Sensor download was reviewed and summarized below.) Dates: June 6 to August 04, 2023, 14 days  Glucose Management Indicator: 6.1% Sensor Average: 117 SD 18 Sensor usage : 96 %      Interpretation: Almost all acceptable blood sugar.  Rarely blood sugar up to 180 range postprandially.  No hypoglycemia.  On June 12 overnight patient had reading hypoglycemia is actually related to sensor issue.  Hypoglycemia: Patient has no hypoglycemic episodes. Patient has hypoglycemia awareness.  Factors modifying glucose control: 1.  Diabetic diet assessment: 3 meals a day.  She has been  eating less carbohydrate lately.  2.  Staying active or exercising:   3.  Medication compliance: compliant all of the time.  Interval history  Hemoglobin A1c 5.4% congratulated her.  CGM data as reviewed above.  She has been tolerating Mounjaro  well.  She has been gradually losing weight as well.  No other complaints today.  REVIEW OF SYSTEMS As per history of present illness.   PAST MEDICAL HISTORY: Past Medical History:  Diagnosis Date   Abnormal uterine bleeding (AUB)    Anxiety    Arthritis    HIPS   Complication of anesthesia     hard to wake   Depression    Diabetes mellitus without complication (HCC)    Endometrial polyp    GERD (gastroesophageal reflux disease)    Gestational diabetes    History of 2019 novel coronavirus disease (COVID-19) 01/2020   per pt mild symptoms that resolved   History of gestational diabetes    History of pregnancy induced hypertension    IDA (iron deficiency anemia)    PCOS (polycystic ovarian syndrome)    Prediabetes    Wears glasses     PAST SURGICAL HISTORY: Past Surgical History:  Procedure Laterality Date   HYSTEROSCOPY WITH D & C N/A 10/17/2018   Procedure: DILATION AND CURRETAGE WITH HYSTEROSCOPY;  Surgeon: Rockney Evalene SQUIBB, MD;  Location:  [place];  Service: Gynecology;  Laterality: N/A;   HYSTEROSCOPY WITH D & C N/A 08/12/2020   Procedure: DILATATION AND CURETTAGE /HYSTEROSCOPY;  Surgeon: Jertson, Jill Evelyn, MD;  Location: [facility] ;  Service: Gynecology;  Laterality: N/A;   LAPAROSCOPY Left 10/17/2018   Procedure: LAPAROSCOPIC  REMOVAL OF PARATUBAL CYST;  Surgeon: Rockney Evalene SQUIBB, MD;  Location: [facility];  Service: Gynecology;  Laterality: Left;   WISDOM TOOTH EXTRACTION     TEEN    ALLERGIES: Allergies  Allergen Reactions   Metronidazole  Rash    FAMILY HISTORY:  Family History  Problem Relation Age of Onset   Diabetes Mother    Hypertension Mother    Hyperlipidemia Mother    Arthritis Mother    Miscarriages / Stillbirths Mother    Obesity Mother     SOCIAL HISTORY: Social History   Socioeconomic History   Marital status: Married    Spouse name: Not on file   Number of children: 2   Years of education: Not on file   Highest education level: Associate degree: occupational, Scientist, product/process development, or vocational program  Occupational History   Not on file  Tobacco Use   Smoking status: Never   Smokeless tobacco: Never  Vaping Use   Vaping status: Never Used  Substance and Sexual Activity    Alcohol use: Yes    Comment: Occasional   Drug use: Never   Sexual activity: Yes    Birth control/protection: Condom    Comment: 1st intercourse 33 yo-More than 5 partners  Other Topics Concern   Not on file  Social History Narrative   Not on file   Social Drivers of Health   Financial Resource Strain: Low Risk  (03/22/2023)   Overall Financial Resource Strain (CARDIA)    Difficulty of Paying Living Expenses: Not hard at all  Food Insecurity: No Food Insecurity (03/22/2023)   Hunger Vital Sign    Worried About Running Out of Food in the Last Year: Never true    Ran Out of Food in the Last Year: Never true  Transportation Needs: No Transportation Needs (03/22/2023)  PRAPARE - Administrator, Civil Service (Medical): No    Lack of Transportation (Non-Medical): No  Physical Activity: Unknown (03/22/2023)   Exercise Vital Sign    Days of Exercise per Week: Patient declined    Minutes of Exercise per Session: 10 min  Stress: No Stress Concern Present (03/22/2023)   [employer] of Occupational Health - Occupational Stress Questionnaire    Feeling of Stress : Only a little  Social Connections: Moderately Isolated (03/22/2023)   Social Connection and Isolation Panel    Frequency of Communication with Friends and Family: More than three times a week    Frequency of Social Gatherings with Friends and Family: Once a week    Attends Religious Services: Never    Database administrator or Organizations: No    Attends Engineer, structural: Not on file    Marital Status: Married    MEDICATIONS:  Current Outpatient Medications  Medication Sig Dispense Refill   blood glucose meter kit and supplies KIT Dispense based on patient and insurance preference. Use up to four times daily as directed. 1 each 0   Blood Glucose Monitoring Suppl (ONE TOUCH ULTRA 2) w/Device KIT 1 each by Does not apply route daily. 1 kit 2   Continuous Glucose Sensor (DEXCOM G7 SENSOR) MISC 1 Device by  Does not apply route continuous. 9 each 4   glucose blood test strip 1 each by Other route daily. One Touch Ultra 100 each 12   Lancets (ONETOUCH DELICA PLUS LANCET33G) MISC 1 EACH BY OTHER ROUTE DAILY. E11.69 100 each 2   tirzepatide  (MOUNJARO ) 12.5 MG/0.5ML Pen Inject 12.5 mg into the skin once a week. 6 mL 4   metFORMIN  (GLUCOPHAGE -XR) 500 MG 24 hr tablet Take 2 tablets (1,000 mg total) by mouth daily with breakfast. 180 tablet 3   No current facility-administered medications for this visit.    PHYSICAL EXAM: Vitals:   08/04/23 1450  BP: 128/84  Pulse: 82  SpO2: 98%  Weight: 208 lb (94.3 kg)  Height: 5' 1 (1.549 m)   Body mass index is 39.3 kg/m.  Wt Readings from Last 3 Encounters:  08/04/23 208 lb (94.3 kg)  07/08/23 213 lb 3.2 oz (96.7 kg)  06/29/23 211 lb (95.7 kg)    General: Well developed, well nourished female in no apparent distress.  HEENT: AT/[place], no external lesions.  Eyes: Conjunctiva clear and no icterus. Neck: Neck supple  Lungs: Respirations not labored Neurologic: Alert, oriented, normal speech Extremities / Skin: Dry. No sores or rashes noted. + acanthosis nigricans Psychiatric: Does not appear depressed or anxious   Diabetic Foot Exam - Simple   No data filed     LABS Reviewed Lab Results  Component Value Date   HGBA1C 5.4 08/04/2023   HGBA1C 7.2 (A) 03/16/2023   HGBA1C 6.4 03/22/2022   No results found for: FRUCTOSAMINE Lab Results  Component Value Date   CHOL 176 03/22/2022   HDL 59.70 03/22/2022   LDLCALC 103 (H) 03/22/2022   TRIG 64.0 03/22/2022   CHOLHDL 3 03/22/2022   No results found for: [facility] Lab Results  Component Value Date   CREATININE 0.52 03/21/2023   Lab Results  Component Value Date   GFR 122.76 03/22/2022    ASSESSMENT / PLAN  1. Type 2 diabetes mellitus without complication, without long-term current use of insulin  (HCC)   2. PCOS (polycystic ovarian syndrome)   3. Controlled type 2 diabetes  mellitus without complication, without long-term  current use of insulin  (HCC)     Diabetes Mellitus type 2, complicated by no known complications. - Diabetic status / severity: Uncontrolled.  Lab Results  Component Value Date   HGBA1C 5.4 08/04/2023    - Hemoglobin A1c goal : <6.5%  She has significant improvement on diabetes control.  Congratulated her.  - Medications: See below.  Increase Mounjaro  from 10 to 12.5 mg weekly.  To help to have increased weight loss as well.  Will gradually maximize Mounjaro .  After significant weight loss patient does not want to be on Mounjaro  anymore in the future.  Continue metformin  500 mg 2 times a day extended release.  Patient is recommended to use birth control method, and avoid pregnancy while taking  Mounjaro .  Just in case if she gets pregnant or actively trying to get pregnant, advised to stop Mounjaro  immediately.  - Home glucose testing: Continue Dexcom G7 and check as needed. - Discussed/ Gave Hypoglycemia treatment plan.  # Consult : not required at this time.    # Annual urine for microalbuminuria/ creatinine ratio, no microalbuminuria currently, will check in the future visit.   Last No results found for: MICRALBCREAT  # Foot check nightly.  # Annual dilated diabetic eye exams.   - Diet: Make healthy diabetic food choices, discussed in detail. - Life style / activity / exercise: Discussed.  2. Blood pressure  -  BP Readings from Last 1 Encounters:  08/04/23 128/84    - Control is in target.  - No change in current plans.  3. Lipid status / Hyperlipidemia - Last  Lab Results  Component Value Date   LDLCALC 103 (H) 03/22/2022   -Currently not on a statin.  Not indicated.    Chloe Cervantes was seen today for follow-up.  Diagnoses and all orders for this visit:  Type 2 diabetes mellitus without complication, without long-term current use of insulin  (HCC) -     POCT glycosylated hemoglobin (Hb A1C) -     tirzepatide   (MOUNJARO ) 12.5 MG/0.5ML Pen; Inject 12.5 mg into the skin once a week. -     Continuous Glucose Sensor (DEXCOM G7 SENSOR) MISC; 1 Device by Does not apply route continuous. -     metFORMIN  (GLUCOPHAGE -XR) 500 MG 24 hr tablet; Take 2 tablets (1,000 mg total) by mouth daily with breakfast.  PCOS (polycystic ovarian syndrome)  Controlled type 2 diabetes mellitus without complication, without long-term current use of insulin  (HCC)    DISPOSITION Follow up in clinic in 4 months suggested.   All questions answered and patient verbalized understanding of the plan.  [place] Margurete Guaman, MD [facility] [address] [place], [address] [place], [place] [address] Phone # [phone]  At least part of this note was generated using voice recognition software. Inadvertent word errors may have occurred, which were not recognized during the proofreading process.

## 2023-08-04 NOTE — Patient Instructions (Signed)
 Latest Reference Range & Units 03/16/23 16:01 08/04/23 14:58  Hemoglobin A1C 4.0 - 5.6 % 7.2 ! 5.4  !: Data is abnormal

## 2023-08-05 ENCOUNTER — Encounter: Payer: Self-pay | Admitting: Endocrinology

## 2023-08-11 ENCOUNTER — Ambulatory Visit: Admitting: Endocrinology

## 2023-08-30 ENCOUNTER — Ambulatory Visit: Admitting: Obstetrics and Gynecology

## 2023-08-30 VITALS — BP 115/81 | HR 82 | Ht 61.5 in | Wt 209.8 lb

## 2023-08-30 DIAGNOSIS — N979 Female infertility, unspecified: Secondary | ICD-10-CM | POA: Diagnosis not present

## 2023-08-30 DIAGNOSIS — E282 Polycystic ovarian syndrome: Secondary | ICD-10-CM

## 2023-08-30 DIAGNOSIS — N939 Abnormal uterine and vaginal bleeding, unspecified: Secondary | ICD-10-CM | POA: Diagnosis not present

## 2023-08-30 LAB — POCT URINE PREGNANCY: Preg Test, Ur: NEGATIVE

## 2023-08-30 MED ORDER — MEDROXYPROGESTERONE ACETATE 10 MG PO TABS: 10.0000 mg | ORAL_TABLET | Freq: Every day | ORAL | 0 refills | Status: DC

## 2023-08-30 NOTE — Progress Notes (Signed)
 Pt presents for f/u for irregular periods. Pt. States that she still has not had a period. Pt states that the last week her breast have been sore. Not other questions or concerns at this time.

## 2023-09-03 NOTE — Progress Notes (Signed)
   RETURN GYNECOLOGY VISIT  Subjective:  Chloe Cervantes is a 33 y.o. [id number] with LMP 04/27/23 presenting for discussion of PCOS/oligomenorrhea and desire for future fertility  Saw patient 05/2023. Known history of PCOS diagnosed in 2017. She was actively trying to lose weight with moujaro, so we discussed seeing how her cycles improve with weight loss over 12 weeks and then reassess.   Today, she reports she is taking PNV and still on mounjaro  and metformin . Weight is overall stable. Her cycles have not changed and her LMP is March. Noticed some breast soreness last week so is wondering if her period is coming soon.   Objective:   Vitals:   08/30/23 1614  BP: 115/81  Pulse: 82  Weight: 209 lb 12.8 oz (95.2 kg)  Height: 5' 1.5 (1.562 m)   General:  Alert, oriented and cooperative. Patient is in no acute distress.  Skin: Skin is warm and dry. No rash noted.   Cardiovascular: Normal heart rate noted  Respiratory: Normal respiratory effort, no problems with respiration noted    Assessment and Plan:  Chloe Cervantes is a 33 y.o. with AUB/PCOS  Abnormal uterine bleeding (AUB) PCOS (polycystic ovarian syndrome) Female subfertility UPT negative Plan for provera  withdrawal bleeding, then fertility work up Will discuss with REI re: safety of OI while on GLP-1. Discussed with patient that we may want to wait until she is off GLP-1 before starting OI SHG closer to when we plan to start OI -     POCT urine pregnancy -     TSH Rfx on Abnormal to Free T4; Future -     Prolactin; Future -     Testosterone,Free and Total; Future -     FSH; Future -     Estradiol ; Future -     Anti mullerian hormone; Future -     medroxyPROGESTERone  (PROVERA ) 10 MG tablet; Take 1 tablet (10 mg total) by mouth daily.  Return in about 4 weeks (around 09/27/2023) for follow up lab results.  Future Appointments  Date Time Provider Department Center  09/20/2023  7:30 AM Norberto Thersia NOVAK, RD  [facility]  10/14/2023  1:00 PM Mercer Clotilda SAUNDERS, MD [facility]  12/06/2023  8:00 AM [place], [place], MD [facility] None  03/08/2024  2:00 PM Alm Delon SAILOR, DO [facility] None   Kieth JAYSON Carolin, MD

## 2023-09-07 ENCOUNTER — Encounter: Payer: Self-pay | Admitting: Family Medicine

## 2023-09-07 ENCOUNTER — Telehealth (INDEPENDENT_AMBULATORY_CARE_PROVIDER_SITE_OTHER): Admitting: Family Medicine

## 2023-09-07 ENCOUNTER — Encounter: Payer: Self-pay | Admitting: Obstetrics and Gynecology

## 2023-09-07 DIAGNOSIS — F411 Generalized anxiety disorder: Secondary | ICD-10-CM

## 2023-09-07 DIAGNOSIS — N979 Female infertility, unspecified: Secondary | ICD-10-CM

## 2023-09-07 DIAGNOSIS — F320 Major depressive disorder, single episode, mild: Secondary | ICD-10-CM | POA: Diagnosis not present

## 2023-09-07 MED ORDER — ESCITALOPRAM OXALATE 10 MG PO TABS
10.0000 mg | ORAL_TABLET | Freq: Every day | ORAL | 3 refills | Status: DC
Start: 2023-09-07 — End: 2023-10-14

## 2023-09-07 NOTE — Progress Notes (Signed)
 Virtual Visit via Video Note  I connected with Chloe Cervantes on 09/07/23 at  4:30 PM EDT by a video enabled telemedicine application and verified that I am speaking with the correct person using two identifiers.  Location patient: home Location provider:work or home office Persons participating in the virtual visit: patient, provider  I discussed the limitations of evaluation and management by telemedicine and the availability of in person appointments. The patient expressed understanding and agreed to proceed. Chief Complaint  Patient presents with   Medical Management of Chronic Issues    Anxiety meds      HPI: Patient is a 33 year old female seen for follow-up on ongoing concerns. Pt and family getting ready to leave for a trip to [place]  today.  Pt plans to start school in the Fall.  Worried about anxiety increasing.  Occurs mainly with test and presentations.  Having some symptoms daily and noticing a slight increase.  As a child pt was on Zoloft .  Did not like how it made her feel.  Not starting fertility treatments until off GLP-1.  Followed by endocrinology and OB/GYN.  ROS: See pertinent positives and negatives per HPI.  Past Medical History:  Diagnosis Date   Abnormal uterine bleeding (AUB)    Anxiety    Arthritis    HIPS   Complication of anesthesia    hard to wake   Depression    Diabetes mellitus without complication (HCC)    Endometrial polyp    GERD (gastroesophageal reflux disease)    Gestational diabetes    History of 2019 novel coronavirus disease (COVID-19) 01/2020   per pt mild symptoms that resolved   History of gestational diabetes    History of pregnancy induced hypertension    IDA (iron deficiency anemia)    PCOS (polycystic ovarian syndrome)    Prediabetes    Wears glasses     Past Surgical History:  Procedure Laterality Date   HYSTEROSCOPY WITH D & C N/A 10/17/2018   Procedure: DILATION AND CURRETAGE WITH HYSTEROSCOPY;  Surgeon: Rockney Evalene SQUIBB, MD;  Location: [facility];  Service: Gynecology;  Laterality: N/A;   HYSTEROSCOPY WITH D & C N/A 08/12/2020   Procedure: DILATATION AND CURETTAGE /HYSTEROSCOPY;  Surgeon: Jertson, Jill Evelyn, MD;  Location[facility];  Service: Gynecology;  Laterality: N/A;   LAPAROSCOPY Left 10/17/2018   Procedure: LAPAROSCOPIC  REMOVAL OF PARATUBAL CYST;  Surgeon: Rockney Evalene SQUIBB, MD;  Location: [facility];  Service: Gynecology;  Laterality: Left;   WISDOM TOOTH EXTRACTION     TEEN    Family History  Problem Relation Age of Onset   Diabetes Mother    Hypertension Mother    Hyperlipidemia Mother    Arthritis Mother    Miscarriages / [place] Mother    Obesity Mother      Current Outpatient Medications:    blood glucose meter kit and supplies KIT, Dispense based on patient and insurance preference. Use up to four times daily as directed., Disp: 1 each, Rfl: 0   Blood Glucose Monitoring Suppl (ONE TOUCH ULTRA 2) w/Device KIT, 1 each by Does not apply route daily., Disp: 1 kit, Rfl: 2   Continuous Glucose Sensor (DEXCOM G7 SENSOR) MISC, 1 Device by Does not apply route continuous., Disp: 9 each, Rfl: 4   glucose blood test strip, 1 each by Other route daily. One Touch Ultra, Disp: 100 each, Rfl: 12   Lancets (ONETOUCH DELICA PLUS LANCET33G) MISC, 1 EACH BY  OTHER ROUTE DAILY. E11.69, Disp: 100 each, Rfl: 2   medroxyPROGESTERone  (PROVERA ) 10 MG tablet, Take 1 tablet (10 mg total) by mouth daily., Disp: 10 tablet, Rfl: 0   metFORMIN  (GLUCOPHAGE -XR) 500 MG 24 hr tablet, Take 2 tablets (1,000 mg total) by mouth daily with breakfast., Disp: 180 tablet, Rfl: 3   tirzepatide  (MOUNJARO ) 12.5 MG/0.5ML Pen, Inject 12.5 mg into the skin once a week., Disp: 6 mL, Rfl: 4  EXAM:  VITALS per patient if applicable: RR between 12-20 bpm  GENERAL: alert, oriented, appears well and in no acute distress  HEENT: atraumatic, conjunctiva clear, no obvious  abnormalities on inspection of external nose and ears  NECK: normal movements of the head and neck  Skin: Hypopigmentation on face and chest due to vitiligo  LUNGS: on inspection no signs of respiratory distress, breathing rate appears normal, no obvious gross SOB, gasping or wheezing  CV: no obvious cyanosis  MS: moves all visible extremities without noticeable abnormality  PSYCH/NEURO: pleasant and cooperative, no obvious depression or anxiety, speech and thought processing grossly intact     09/07/2023    4:15 PM 07/08/2023    4:39 PM 05/31/2023    8:40 AM 04/04/2023    3:53 PM  GAD 7 : Generalized Anxiety Score  Nervous, Anxious, on Edge 3 2 3 3   Control/stop worrying 3 2 3 2   Worry too much - different things 3 2 3 3   Trouble relaxing 3 1 0 0  Restless 3 0 0 0  Easily annoyed or irritable 3 2 2 3   Afraid - awful might happen 3 3 3 3   Total GAD 7 Score 21 12 14 14   Anxiety Difficulty  Not difficult at all Somewhat difficult Somewhat difficult       09/07/2023    4:13 PM 07/08/2023    4:39 PM 05/31/2023    8:39 AM  Depression screen PHQ 2/9  Decreased Interest 0 0 1  Down, Depressed, Hopeless 3 1 1   PHQ - 2 Score 3 1 2   Altered sleeping 3 0 0  Tired, decreased energy 3 1 2   Change in appetite 2 0 0  Feeling bad or failure about yourself  1 0 0  Trouble concentrating 0 0 0  Moving slowly or fidgety/restless 0 0 0  Suicidal thoughts 0 0 0  PHQ-9 Score 12 2 4   Difficult doing work/chores  Not difficult at all Not difficult at all    ASSESSMENT AND PLAN:  Discussed the following assessment and plan:  GAD (generalized anxiety disorder) - Plan: escitalopram  (LEXAPRO ) 10 MG tablet  Depression, major, single episode, mild (HCC) - Plan: escitalopram  (LEXAPRO ) 10 MG tablet  Female subfertility  Patient with increased anxiety and depression symptoms.  PHQ-9 score 12 this visit, GAD-7 score 21.  Previously 2 and 12 respectively.  Wishes to start medication as concerned  starting school in the fall will make symptoms worse.  Discussed r/b/a of starting antianxiety/depression medications/taking medications if were to become pregnant.  Patient wishes to start medication.  In the past did not like how Zoloft  made her feel.  Will start Lexapro  10 mg daily.  Discussed the importance of consistent daily medication use.  Followed by OB/GYN and endocrinology.  Planning to stop GLP-1 prior to ovulation induction for pregnancy.  Advised to continue follow-up with OB/GYN.  Consider counseling.  Follow-up in 4-6 weeks after starting Lexapro  10 mg daily, sooner if needed.   I discussed the assessment and treatment plan with the  patient. The patient was provided an opportunity to ask questions and all were answered. The patient agreed with the plan and demonstrated an understanding of the instructions.   The patient was advised to call back or seek an in-person evaluation if the symptoms worsen or if the condition fails to improve as anticipated.   Clotilda JONELLE Single, MD

## 2023-09-20 ENCOUNTER — Ambulatory Visit: Admitting: Skilled Nursing Facility1

## 2023-10-03 ENCOUNTER — Other Ambulatory Visit: Payer: Self-pay | Admitting: Family Medicine

## 2023-10-03 DIAGNOSIS — F411 Generalized anxiety disorder: Secondary | ICD-10-CM

## 2023-10-03 DIAGNOSIS — F320 Major depressive disorder, single episode, mild: Secondary | ICD-10-CM

## 2023-10-07 ENCOUNTER — Encounter: Payer: Self-pay | Admitting: Family Medicine

## 2023-10-07 ENCOUNTER — Encounter: Payer: Self-pay | Admitting: Obstetrics and Gynecology

## 2023-10-08 ENCOUNTER — Other Ambulatory Visit: Payer: Self-pay | Admitting: Medical Genetics

## 2023-10-14 ENCOUNTER — Telehealth (INDEPENDENT_AMBULATORY_CARE_PROVIDER_SITE_OTHER): Admitting: Family Medicine

## 2023-10-14 ENCOUNTER — Ambulatory Visit: Admitting: Family Medicine

## 2023-10-14 ENCOUNTER — Encounter: Payer: Self-pay | Admitting: Family Medicine

## 2023-10-14 DIAGNOSIS — F320 Major depressive disorder, single episode, mild: Secondary | ICD-10-CM

## 2023-10-14 DIAGNOSIS — F411 Generalized anxiety disorder: Secondary | ICD-10-CM

## 2023-10-14 DIAGNOSIS — E1169 Type 2 diabetes mellitus with other specified complication: Secondary | ICD-10-CM | POA: Diagnosis not present

## 2023-10-14 DIAGNOSIS — U071 COVID-19: Secondary | ICD-10-CM | POA: Diagnosis not present

## 2023-10-14 MED ORDER — ESCITALOPRAM OXALATE 10 MG PO TABS: 10.0000 mg | ORAL_TABLET | Freq: Every day | ORAL | 3 refills | Status: AC

## 2023-10-14 MED ORDER — NIRMATRELVIR/RITONAVIR (PAXLOVID)TABLET: 3.0000 | ORAL_TABLET | Freq: Two times a day (BID) | ORAL | 0 refills | Status: AC

## 2023-10-14 NOTE — Progress Notes (Signed)
 Virtual Visit via Video Note  I connected with Chloe Cervantes on 10/14/23 at  1:00 PM EDT by a video enabled telemedicine application and verified that I am speaking with the correct person using two identifiers.  Visit started via video however provider contacted pt via phone as her audio was not working for provider.  Provider was able to see pt.  Location patient: home Location provider:work or home office Persons participating in the virtual visit: patient, provider  I discussed the limitations of evaluation and management by telemedicine and the availability of in person appointments. The patient expressed understanding and agreed to proceed. Chief Complaint  Patient presents with   Medical Management of Chronic Issues     HPI: Pt is a 33 yo female seen for f/u on anxiety and acute concern.  Pt started on lexapro  10 mg daily at last visit.  Feels like thoughts are a lot less.  Less of the worried feeling.  Had a warm fuzzy feeling when first started taking it.  Having difficulty remembering to take it in the am.  Has a reminder on her phone. Considering switching administration to evenings.  Pt tested for positive for COVID today.  Started having body aches, chills, mild cough, throat irritation, congestion. Denies fever.  Appetite is so so.   Taking dayquil and nyquil. State bs was dropping into the 70s.  Now 109 since eating a little.  Has not taken lexapro  today.  Mounjaro  dose due today, but hasn't taken it.  Followed by Endo.  ROS: See pertinent positives and negatives per HPI.  Past Medical History:  Diagnosis Date   Abnormal uterine bleeding (AUB)    Anxiety    Arthritis    HIPS   Complication of anesthesia    hard to wake   Depression    Diabetes mellitus without complication (HCC)    Endometrial polyp    GERD (gastroesophageal reflux disease)    Gestational diabetes    History of 2019 novel coronavirus disease (COVID-19) 01/2020   per pt mild symptoms that resolved    History of gestational diabetes    History of pregnancy induced hypertension    IDA (iron deficiency anemia)    PCOS (polycystic ovarian syndrome)    Prediabetes    Wears glasses     Past Surgical History:  Procedure Laterality Date   HYSTEROSCOPY WITH D & C N/A 10/17/2018   Procedure: DILATION AND CURRETAGE WITH HYSTEROSCOPY;  Surgeon: Rockney Evalene SQUIBB, MD;  Location: [facility];  Service: Gynecology;  Laterality: N/A;   HYSTEROSCOPY WITH D & C N/A 08/12/2020   Procedure: DILATATION AND CURETTAGE /HYSTEROSCOPY;  Surgeon: Jertson, Jill Evelyn, MD;  Location: [facility];  Service: Gynecology;  Laterality: N/A;   LAPAROSCOPY Left 10/17/2018   Procedure: LAPAROSCOPIC  REMOVAL OF PARATUBAL CYST;  Surgeon: Rockney Evalene SQUIBB, MD;  Location: [facility];  Service: Gynecology;  Laterality: Left;   WISDOM TOOTH EXTRACTION     TEEN    Family History  Problem Relation Age of Onset   Diabetes Mother    Hypertension Mother    Hyperlipidemia Mother    Arthritis Mother    Miscarriages / [place] Mother    Obesity Mother     Current Outpatient Medications:    blood glucose meter kit and supplies KIT, Dispense based on patient and insurance preference. Use up to four times daily as directed., Disp: 1 each, Rfl: 0   Blood Glucose Monitoring Suppl (ONE TOUCH  ULTRA 2) w/Device KIT, 1 each by Does not apply route daily., Disp: 1 kit, Rfl: 2   Continuous Glucose Sensor (DEXCOM G7 SENSOR) MISC, 1 Device by Does not apply route continuous., Disp: 9 each, Rfl: 4   escitalopram  (LEXAPRO ) 10 MG tablet, Take 1 tablet (10 mg total) by mouth daily., Disp: 30 tablet, Rfl: 3   glucose blood test strip, 1 each by Other route daily. One Touch Ultra, Disp: 100 each, Rfl: 12   Lancets (ONETOUCH DELICA PLUS LANCET33G) MISC, 1 EACH BY OTHER ROUTE DAILY. E11.69, Disp: 100 each, Rfl: 2   medroxyPROGESTERone  (PROVERA ) 10 MG tablet, Take 1 tablet (10 mg total) by  mouth daily., Disp: 10 tablet, Rfl: 0   metFORMIN  (GLUCOPHAGE -XR) 500 MG 24 hr tablet, Take 2 tablets (1,000 mg total) by mouth daily with breakfast., Disp: 180 tablet, Rfl: 3   tirzepatide  (MOUNJARO ) 12.5 MG/0.5ML Pen, Inject 12.5 mg into the skin once a week., Disp: 6 mL, Rfl: 4  EXAM:  VITALS per patient if applicable:  RR between 12-20 bpm  GENERAL: alert, oriented, appears well and in no acute distress  HEENT: atraumatic, conjunctiva clear, no obvious abnormalities on inspection of external nose and ears  NECK: normal movements of the head and neck  LUNGS: on inspection no signs of respiratory distress, breathing rate appears normal, no obvious gross SOB, gasping or wheezing  CV: no obvious cyanosis  MS: moves all visible extremities without noticeable abnormality  PSYCH/NEURO: pleasant and cooperative, no obvious depression or anxiety, speech and thought processing grossly intact     10/14/2023    1:17 PM 09/07/2023    4:15 PM 07/08/2023    4:39 PM 05/31/2023    8:40 AM  GAD 7 : Generalized Anxiety Score  Nervous, Anxious, on Edge 0 3 2 3   Control/stop worrying 0 3 2 3   Worry too much - different things 0 3 2 3   Trouble relaxing 0 3 1 0  Restless 0 3 0 0  Easily annoyed or irritable 3 3 2 2   Afraid - awful might happen 0 3 3 3   Total GAD 7 Score 3 21 12 14   Anxiety Difficulty Not difficult at all  Not difficult at all Somewhat difficult       10/14/2023    1:15 PM 09/07/2023    4:13 PM 07/08/2023    4:39 PM  Depression screen PHQ 2/9  Decreased Interest 2 0 0  Down, Depressed, Hopeless 0 3 1  PHQ - 2 Score 2 3 1   Altered sleeping 0 3 0  Tired, decreased energy 3 3 1   Change in appetite 0 2 0  Feeling bad or failure about yourself  0 1 0  Trouble concentrating 0 0 0  Moving slowly or fidgety/restless 0 0 0  Suicidal thoughts 0 0 0  PHQ-9 Score 5 12 2   Difficult doing work/chores Not difficult at all  Not difficult at all    ASSESSMENT AND PLAN:  Discussed  the following assessment and plan:  COVID-19 virus infection - Plan: nirmatrelvir /ritonavir  (PAXLOVID ) 20 x 150 MG & 10 x 100MG  TABS  GAD (generalized anxiety disorder) - Plan: escitalopram  (LEXAPRO ) 10 MG tablet  Depression, major, single episode, mild (HCC) - Plan: escitalopram  (LEXAPRO ) 10 MG tablet  Type 2 diabetes mellitus with other specified complication, without long-term current use of insulin  (HCC)  Acute viral uri sx 2/2 COVID -19 infection.  Positive home test today.  Discussed r/b/a of antiviral meds.  Paxlovid  sent to  pharmacy, however advised may be cost prohibitive.  Continue supportive care with OTC cough/cold meds.  Pt advised to hold mounjaro  12.5 mg and metformin  xr 1000 mg BID.  Monitor bs.  Once appetite improves/ if develops hyperglycemia can restart meds.  Anxiety and depression improving since starting lexapro  10 mg daily. Continue med.  PHQ 9 score 5 this visit, was 12 on 09/07/23.  GAD7 score 3. Was 12 on7/23/25.  Consider counseling.   f/u in 3 months, sooner if needed for anxiety/depression.  F/u prn for COVID.  I discussed the assessment and treatment plan with the patient. The patient was provided an opportunity to ask questions and all were answered. The patient agreed with the plan and demonstrated an understanding of the instructions.   The patient was advised to call back or seek an in-person evaluation if the symptoms worsen or if the condition fails to improve as anticipated. I personally spent a total of 15 minutes in the care of the patient today including getting/reviewing separately obtained history, performing a medically appropriate exam/evaluation, counseling and educating, placing orders, documenting clinical information in the EHR, communicating results, and coordinating care.   Clotilda JONELLE Single, MD

## 2023-10-25 ENCOUNTER — Encounter: Payer: Self-pay | Admitting: Family Medicine

## 2023-11-14 ENCOUNTER — Encounter: Payer: Self-pay | Admitting: Endocrinology

## 2023-11-15 ENCOUNTER — Telehealth: Payer: Self-pay

## 2023-11-15 ENCOUNTER — Other Ambulatory Visit (HOSPITAL_COMMUNITY): Payer: Self-pay

## 2023-11-15 NOTE — Telephone Encounter (Signed)
 Pharmacy Patient Advocate Encounter   Received notification from Pt Calls Messages that prior authorization for Dexcom G7 sensor is required/requested.   Insurance verification completed.   The patient is insured through [facility] .   Per test claim: PA required; PA started via CoverMyMeds. KEY [id number] . Please see clinical question(s) below that I am not finding the answer to in their chart and advise.     I see that patient was started on insulin  in January, but it has since been discontinued. If patient is not currently using inulin, PA will be denied. Please advise

## 2023-11-28 ENCOUNTER — Encounter: Payer: Self-pay | Admitting: Obstetrics and Gynecology

## 2023-11-28 ENCOUNTER — Telehealth: Payer: Self-pay

## 2023-11-28 DIAGNOSIS — Z3A01 Less than 8 weeks gestation of pregnancy: Secondary | ICD-10-CM

## 2023-11-28 NOTE — Telephone Encounter (Signed)
 Stop Mounjaro  from now.  Monitor blood sugar with Dexcom G7.  Will discuss further on coming follow-up visit in October 21.

## 2023-11-28 NOTE — Telephone Encounter (Signed)
 I recommend to follow-up with OB however if she wants to let the lab for her pregnancy test I have placed order.

## 2023-11-28 NOTE — Addendum Note (Signed)
 Addended by: Saud Bail, IRAQ on: 11/28/2023 04:08 PM   Modules accepted: Orders

## 2023-11-28 NOTE — Telephone Encounter (Signed)
 Patient called with advisement for medication management r/t a positive pregnancy test.

## 2023-12-06 ENCOUNTER — Encounter: Payer: Self-pay | Admitting: Endocrinology

## 2023-12-06 ENCOUNTER — Other Ambulatory Visit

## 2023-12-06 ENCOUNTER — Ambulatory Visit: Payer: Self-pay | Admitting: Endocrinology

## 2023-12-06 ENCOUNTER — Ambulatory Visit: Admitting: Endocrinology

## 2023-12-06 VITALS — BP 126/80 | HR 74 | Resp 20 | Ht 61.5 in | Wt 201.2 lb

## 2023-12-06 DIAGNOSIS — E119 Type 2 diabetes mellitus without complications: Secondary | ICD-10-CM

## 2023-12-06 DIAGNOSIS — Z3201 Encounter for pregnancy test, result positive: Secondary | ICD-10-CM | POA: Diagnosis not present

## 2023-12-06 DIAGNOSIS — Z7984 Long term (current) use of oral hypoglycemic drugs: Secondary | ICD-10-CM

## 2023-12-06 DIAGNOSIS — Z794 Long term (current) use of insulin: Secondary | ICD-10-CM | POA: Diagnosis not present

## 2023-12-06 LAB — POCT GLYCOSYLATED HEMOGLOBIN (HGB A1C): Hemoglobin A1C: 5.1 % (ref 4.0–5.6)

## 2023-12-06 NOTE — Telephone Encounter (Signed)
 Patient advised after office visit that she does need a PA for the Dexcom

## 2023-12-06 NOTE — Progress Notes (Signed)
 Outpatient Endocrinology Note [place] Kahlen Morais, MD   Patient's Name: Chloe Cervantes    DOB: 1991/01/15    MRN: [id number]                                                    REASON OF VISIT: Follow-up for type 2 diabetes mellitus  REFERRING PROVIDER: Mercer Clotilda SAUNDERS, MD   PCP: Mercer Clotilda SAUNDERS, MD  HISTORY OF PRESENT ILLNESS:   Chloe Cervantes is a 33 y.o. old female with past medical history listed below, is here for follow-up for type 2 diabetes mellitus.   Pertinent Diabetes History: Patient was diagnosed with type 2 diabetes mellitus in September 2022, hemoglobin A1c at the time of diagnosis was 6.8%.  Patient was taking metformin  prior to diagnosis of type 2 diabetes mellitus for PCOS and she had prediabetes as well.  Patient has history of gestational diabetes in 2017.  End of 2024 patient had hyperglycemia with blood sugar up to 250 / 270 range and sometime up to 300 range postprandially and fasting blood sugar 150-200 range.  Patient has symptoms of lightheadedness with hyperglycemia.  In the January 2025 patient was started on basal insulin  Lantus , 5 units daily and gradually increase to 8 units daily.  Patient has been using Dexcom G7 continuous glucose monitor as well.  History of DKA or diabetes related hospitalizations: none  Labs with negative GAD 65 antibodies and C-peptide is high which is appropriate in the setting of type 2 diabetes mellitus and obesity.   Latest Reference Range & Units 03/23/23 08:29  Glutamic Acid Decarb Ab <5 IU/mL <5  C-Peptide 0.80 - 3.85 ng/mL 4.82 (H)  (H): Data is abnormally high  Previous diabetes education: yes  Family h/o diabetes mellitus: mother with type 2 diabetes mellitus.   No personal history of pancreatitis and / or family history of medullary thyroid  carcinoma or MEN 2B syndrome. Unknown about father side of family.  Chronic Diabetes Complications : Retinopathy: no. Last ophthalmology exam was done on 01/2023,  following with ophthalmology regularly.  Nephropathy: no Peripheral neuropathy: no  Coronary artery disease: no Stroke: no  Relevant comorbidities and cardiovascular risk factors: Obesity: yes Body mass index is 37.4 kg/m.  Hypertension: no Hyperlipidemia : no  Current / Home Diabetic regimen includes:  Metformin  500 mg 2 times a day. Mounjaro  12.5 mg weekly.  Stopped last week after possible urine pregnancy test positive.  Prior diabetic medications: GI upset with higher dose of metformin .  Victoza  was switched to Mounjaro .  Lantus  stopped after improvement of diabetes.  Glycemic data:    CONTINUOUS GLUCOSE MONITORING SYSTEM (CGMS) INTERPRETATION: At today's visit, we reviewed CGM downloads. The full report is scanned in the media. Reviewing the CGM trends, blood glucose are as follows:  Dexcom G7 CGM-  Sensor Download (Sensor download was reviewed and summarized below.) Dates: October 7 to December 05, 2023, 14 days  Glucose Management Indicator: 6.2%    Interpretation: Mostly acceptable blood sugar with average blood sugar of 119, normal hyperglycemia and no hypoglycemia.  Hypoglycemia: Patient has no hypoglycemic episodes. Patient has hypoglycemia awareness.  Factors modifying glucose control: 1.  Diabetic diet assessment: 3 meals a day.  She has been eating less carbohydrate lately.  2.  Staying active or exercising:   3.  Medication compliance: compliant all of the  time.  Interval history  Hemoglobin A1c 5.1%.  CGM data as reviewed above, mostly acceptable blood sugar.  She started taking Mounjaro  last week after she found possible urine pregnancy test positive.  She reports she is not actively planning for pregnancy however she also does not use birth control method.  She lost about 10 pounds of weight in last 3 months.  She has been tolerating Mounjaro  well, denies GI issues.  No other complaints today.  REVIEW OF SYSTEMS As per history of present illness.    PAST MEDICAL HISTORY: Past Medical History:  Diagnosis Date   Abnormal uterine bleeding (AUB)    Anxiety    Arthritis    HIPS   Complication of anesthesia    hard to wake   Depression    Diabetes mellitus without complication (HCC)    Endometrial polyp    GERD (gastroesophageal reflux disease)    Gestational diabetes    History of 2019 novel coronavirus disease (COVID-19) 01/2020   per pt mild symptoms that resolved   History of gestational diabetes    History of pregnancy induced hypertension    IDA (iron deficiency anemia)    PCOS (polycystic ovarian syndrome)    Prediabetes    Wears glasses     PAST SURGICAL HISTORY: Past Surgical History:  Procedure Laterality Date   HYSTEROSCOPY WITH D & C N/A 10/17/2018   Procedure: DILATION AND CURRETAGE WITH HYSTEROSCOPY;  Surgeon: Rockney Evalene SQUIBB, MD;  Location: [facility];  Service: Gynecology;  Laterality: N/A;   HYSTEROSCOPY WITH D & C N/A 08/12/2020   Procedure: DILATATION AND CURETTAGE /HYSTEROSCOPY;  Surgeon: Jertson, Jill Evelyn, MD;  Location: [facility];  Service: Gynecology;  Laterality: N/A;   LAPAROSCOPY Left 10/17/2018   Procedure: LAPAROSCOPIC  REMOVAL OF PARATUBAL CYST;  Surgeon: Rockney Evalene SQUIBB, MD;  Location: [facility];  Service: Gynecology;  Laterality: Left;   WISDOM TOOTH EXTRACTION     TEEN    ALLERGIES: Allergies  Allergen Reactions   Metronidazole  Rash    FAMILY HISTORY:  Family History  Problem Relation Age of Onset   Diabetes Mother    Hypertension Mother    Hyperlipidemia Mother    Arthritis Mother    Miscarriages / Stillbirths Mother    Obesity Mother     SOCIAL HISTORY: Social History   Socioeconomic History   Marital status: Married    Spouse name: Not on file   Number of children: 2   Years of education: Not on file   Highest education level: Associate degree: academic program  Occupational History   Not on file  Tobacco  Use   Smoking status: Never   Smokeless tobacco: Never  Vaping Use   Vaping status: Never Used  Substance and Sexual Activity   Alcohol use: Yes    Comment: Occasional   Drug use: Never   Sexual activity: Yes    Birth control/protection: Condom    Comment: 1st intercourse 33 yo-More than 5 partners  Other Topics Concern   Not on file  Social History Narrative   Not on file   Social Drivers of Health   Financial Resource Strain: Low Risk  (10/12/2023)   Overall Financial Resource Strain (CARDIA)    Difficulty of Paying Living Expenses: Not hard at all  Food Insecurity: No Food Insecurity (10/12/2023)   Hunger Vital Sign    Worried About Running Out of Food in the Last Year: Never true    Ran  Out of Food in the Last Year: Never true  Transportation Needs: No Transportation Needs (10/12/2023)   PRAPARE - Administrator, Civil Service (Medical): No    Lack of Transportation (Non-Medical): No  Physical Activity: Insufficiently Active (10/12/2023)   Exercise Vital Sign    Days of Exercise per Week: 3 days    Minutes of Exercise per Session: 30 min  Stress: No Stress Concern Present (10/12/2023)   [employer] of Occupational Health - Occupational Stress Questionnaire    Feeling of Stress: Only a little  Social Connections: Moderately Isolated (10/12/2023)   Social Connection and Isolation Panel    Frequency of Communication with Friends and Family: More than three times a week    Frequency of Social Gatherings with Friends and Family: Once a week    Attends Religious Services: Never    Database administrator or Organizations: No    Attends Engineer, structural: Not on file    Marital Status: Married    MEDICATIONS:  Current Outpatient Medications  Medication Sig Dispense Refill   blood glucose meter kit and supplies KIT Dispense based on patient and insurance preference. Use up to four times daily as directed. 1 each 0   Blood Glucose Monitoring Suppl  (ONE TOUCH ULTRA 2) w/Device KIT 1 each by Does not apply route daily. 1 kit 2   Continuous Glucose Sensor (DEXCOM G7 SENSOR) MISC 1 Device by Does not apply route continuous. 9 each 4   glucose blood test strip 1 each by Other route daily. One Touch Ultra 100 each 12   Lancets (ONETOUCH DELICA PLUS LANCET33G) MISC 1 EACH BY OTHER ROUTE DAILY. E11.69 100 each 2   metFORMIN  (GLUCOPHAGE -XR) 500 MG 24 hr tablet Take 2 tablets (1,000 mg total) by mouth daily with breakfast. 180 tablet 3   Prenatal Vit-Fe Fumarate-FA (MULTIVITAMIN-PRENATAL) 27-0.8 MG TABS tablet Take 1 tablet by mouth daily at 12 noon.     tirzepatide  (MOUNJARO ) 12.5 MG/0.5ML Pen Inject 12.5 mg into the skin once a week. 6 mL 4   escitalopram  (LEXAPRO ) 10 MG tablet Take 1 tablet (10 mg total) by mouth daily. (Patient not taking: Reported on 12/06/2023) 30 tablet 3   medroxyPROGESTERone  (PROVERA ) 10 MG tablet Take 1 tablet (10 mg total) by mouth daily. (Patient not taking: Reported on 12/06/2023) 10 tablet 0   No current facility-administered medications for this visit.    PHYSICAL EXAM: Vitals:   12/06/23 0811  BP: 126/80  Pulse: 74  Resp: 20  SpO2: 98%  Weight: 201 lb 3.2 oz (91.3 kg)  Height: 5' 1.5 (1.562 m)   Body mass index is 37.4 kg/m.  Wt Readings from Last 3 Encounters:  12/06/23 201 lb 3.2 oz (91.3 kg)  08/30/23 209 lb 12.8 oz (95.2 kg)  08/04/23 208 lb (94.3 kg)    General: Well developed, well nourished female in no apparent distress.  HEENT: AT/, no external lesions.  Eyes: Conjunctiva clear and no icterus. Neck: Neck supple  Lungs: Respirations not labored Neurologic: Alert, oriented, normal speech Extremities / Skin: Dry. No sores or rashes noted. + acanthosis nigricans Psychiatric: Does not appear depressed or anxious   Diabetic Foot Exam - Simple   Simple Foot Form Diabetic Foot exam was performed with the following findings: Yes 12/06/2023  8:37 AM  Visual Inspection No deformities, no  ulcerations, no other skin breakdown bilaterally: Yes Sensation Testing Intact to touch and monofilament testing bilaterally: Yes Pulse Check Posterior Tibialis and  Dorsalis pulse intact bilaterally: Yes Comments     LABS Reviewed Lab Results  Component Value Date   HGBA1C 5.1 12/06/2023   HGBA1C 5.4 08/04/2023   HGBA1C 7.2 (A) 03/16/2023   No results found for: FRUCTOSAMINE Lab Results  Component Value Date   CHOL 176 03/22/2022   HDL 59.70 03/22/2022   LDLCALC 103 (H) 03/22/2022   TRIG 64.0 03/22/2022   CHOLHDL 3 03/22/2022   No results found for: [facility] Lab Results  Component Value Date   CREATININE 0.52 03/21/2023   Lab Results  Component Value Date   GFR 122.76 03/22/2022    ASSESSMENT / PLAN  1. Type 2 diabetes mellitus without complication, without long-term current use of insulin  (HCC)   2. Urine pregnancy test positive     Diabetes Mellitus type 2, complicated by no known complications. - Diabetic status / severity: controlled.  Lab Results  Component Value Date   HGBA1C 5.1 12/06/2023    - Hemoglobin A1c goal : <6.5%  She has significant improvement on diabetes control.  Congratulated her.  Patient reports she had possible urine pregnancy test positive at home, will check serum hCG for pregnancy test.  - Medications: See below.  Hold Mounjaro  for now until result of urine pregnancy test.  If she is pregnant she cannot be on Mounjaro .  Okay to continue metformin  500 mg 2 times a day extended release for now.  If she is pregnant will also stop metformin  and continue to monitor blood sugar with CGM/Dexcom G7.  If the blood sugar not at goal for pregnancy with hyperglycemia will plan for insulin  therapy starting with basal insulin  followed by if needed prandial insulin .  Patient reports he is not actively trying to get pregnant.  She wants to lose more weight before planning for pregnancy and she has also been following with  OB/GYN.  Asked patient to use birth control method when she is not planning for pregnancy.  I recommended to be off of GLP-1 receptor agonist/Mounjaro  when she is actively planning for pregnancy.  Patient agreed to try birth control method.   - Home glucose testing: Continue Dexcom G7 and check as needed. - Discussed/ Gave Hypoglycemia treatment plan.  # Consult : not required at this time.    # Annual urine for microalbuminuria/ creatinine ratio, no microalbuminuria currently, will check today.   Last No results found for: MICRALBCREAT  # Foot check nightly.  # Annual dilated diabetic eye exams.   - Diet: Make healthy diabetic food choices, discussed in detail. - Life style / activity / exercise: Discussed.  2. Blood pressure  -  BP Readings from Last 1 Encounters:  12/06/23 126/80    - Control is in target.  - No change in current plans.  3. Lipid status / Hyperlipidemia - Last  Lab Results  Component Value Date   LDLCALC 103 (H) 03/22/2022   -Currently not on a statin.  Not indicated.    Diagnoses and all orders for this visit:  Type 2 diabetes mellitus without complication, without long-term current use of insulin  (HCC) -     POCT glycosylated hemoglobin (Hb A1C) -     hCG, quantitative, pregnancy -     Microalbumin / creatinine urine ratio -     Basic metabolic panel with GFR  Urine pregnancy test positive   DISPOSITION Follow up in clinic in 4 months suggested.  If she is pregnant we will plan for every  4 to 6 weeks follow-up.  All questions answered and patient verbalized understanding of the plan.  [place] Dmitry Macomber, MD [facility] Endocrinology [facility] [address] [place], [address] [place], [place] [address] Phone # [phone]  At least part of this note was generated using voice recognition software. Inadvertent word errors may have occurred, which were not recognized during the proofreading process.

## 2023-12-07 LAB — MICROALBUMIN / CREATININE URINE RATIO
Creatinine, Urine: 194 mg/dL (ref 20–275)
Microalb Creat Ratio: 3 mg/g{creat} (ref <30)
Microalb, Ur: 0.5 mg/dL

## 2023-12-07 LAB — HCG, QUANTITATIVE, PREGNANCY: HCG, Total, QN: <5 m[IU]/mL

## 2023-12-07 LAB — BASIC METABOLIC PANEL WITH GFR
BUN: 14 mg/dL (ref 7–25)
CO2: 29 mmol/L (ref 20–32)
Calcium: 9.4 mg/dL (ref 8.6–10.2)
Chloride: 102 mmol/L (ref 98–110)
Creat: 0.6 mg/dL (ref 0.50–0.97)
Glucose, Bld: 88 mg/dL (ref 65–99)
Potassium: 4.3 mmol/L (ref 3.5–5.3)
Sodium: 138 mmol/L (ref 135–146)
eGFR: 123 mL/min/1.73m2 (ref >=60)

## 2023-12-07 MED ORDER — TIRZEPATIDE 15 MG/0.5ML ~~LOC~~ SOAJ: 15.0000 mg | SUBCUTANEOUS | 3 refills | Status: AC

## 2023-12-07 NOTE — Telephone Encounter (Signed)
 Correct but pt is no longer on any insulin , as Dr. Mercie confirmed on 11/15/23. PA request has been cancelled.

## 2023-12-07 NOTE — Telephone Encounter (Signed)
 Check blood sugar at least daily in the morning fasting and occasionally at bedtime.  Blood sugar in the morning fasting should be in the low 100 range.  And at bedtime should be around 150 or below.  [place] Shamirah Ivan, MD [facility] Endocrinology [facility] [address] [place], [address] [place], [place] [address] Phone # [phone]

## 2024-01-03 ENCOUNTER — Other Ambulatory Visit (HOSPITAL_COMMUNITY): Payer: Self-pay

## 2024-01-04 ENCOUNTER — Other Ambulatory Visit: Payer: Self-pay | Admitting: Family Medicine

## 2024-01-04 DIAGNOSIS — E1169 Type 2 diabetes mellitus with other specified complication: Secondary | ICD-10-CM

## 2024-01-23 ENCOUNTER — Ambulatory Visit: Admitting: Family Medicine

## 2024-01-23 VITALS — BP 98/78 | HR 111 | Temp 98.8°F | Ht 61.5 in | Wt 197.2 lb

## 2024-01-23 DIAGNOSIS — J020 Streptococcal pharyngitis: Secondary | ICD-10-CM

## 2024-01-23 DIAGNOSIS — R6883 Chills (without fever): Secondary | ICD-10-CM | POA: Diagnosis not present

## 2024-01-23 DIAGNOSIS — E1169 Type 2 diabetes mellitus with other specified complication: Secondary | ICD-10-CM

## 2024-01-23 DIAGNOSIS — J029 Acute pharyngitis, unspecified: Secondary | ICD-10-CM

## 2024-01-23 DIAGNOSIS — R52 Pain, unspecified: Secondary | ICD-10-CM

## 2024-01-23 DIAGNOSIS — Z9189 Other specified personal risk factors, not elsewhere classified: Secondary | ICD-10-CM

## 2024-01-23 LAB — POCT INFLUENZA A/B
Influenza A, POC: NEGATIVE
Influenza B, POC: NEGATIVE

## 2024-01-23 LAB — POCT RAPID STREP A (OFFICE): Rapid Strep A Screen: POSITIVE — AB

## 2024-01-23 LAB — POC COVID19 BINAXNOW: SARS Coronavirus 2 Ag: NEGATIVE

## 2024-01-23 MED ORDER — AMOXICILLIN 500 MG PO TABS: 500.0000 mg | ORAL_TABLET | Freq: Two times a day (BID) | ORAL | 0 refills | Status: AC

## 2024-01-23 MED ORDER — NIRMATRELVIR/RITONAVIR (PAXLOVID)TABLET: ORAL_TABLET | ORAL | 0 refills | Status: DC

## 2024-01-23 MED ORDER — NIRMATRELVIR/RITONAVIR (PAXLOVID)TABLET: 3.0000 | ORAL_TABLET | Freq: Two times a day (BID) | ORAL | 0 refills | Status: DC

## 2024-01-23 NOTE — Progress Notes (Signed)
 Established Patient Office Visit   Subjective  Patient ID: Chloe Cervantes, female    DOB: [birth year]  Age: 33 y.o. MRN: [id number]  Chief Complaint  Patient presents with   Acute Visit    Patient came in with a sore throat started yesterday, also has body aches, and chills but reports no fever.  Patient says she hasn't eaten in almost 48 hours.    Pt is a 33 yo female seen for acute concern.  Pt with sore throat, chills, body aches x 1 day.  Sick contacts include her daughter.  Pt tried dayquil and nyquil for sx.  Has not been able to eat or drink due to the sore throat.  BS was 75 this am.    Patient Active Problem List   Diagnosis Date Noted   Vitiligo 04/10/2023   Rosacea 04/10/2023   Hepatic steatosis 01/15/2022   Vitamin D  deficiency 01/15/2022   Type 2 diabetes mellitus with other specified complication (HCC) 04/20/2021   Anemia 04/14/2021   Femoral acetabular impingement 02/29/2020   PCOS (polycystic ovarian syndrome) 04/07/2018   Anxiety and depression 04/07/2018   Iron deficiency anemia 04/07/2018   Obesity 09/21/2017   Abnormal uterine bleeding (AUB) 01/21/2017   Past Medical History:  Diagnosis Date   Abnormal uterine bleeding (AUB)    Anxiety    Arthritis    HIPS   Complication of anesthesia    hard to wake   Depression    Diabetes mellitus without complication (HCC)    Endometrial polyp    GERD (gastroesophageal reflux disease)    Gestational diabetes    History of 2019 novel coronavirus disease (COVID-19) 01/2020   per pt mild symptoms that resolved   History of gestational diabetes    History of pregnancy induced hypertension    IDA (iron deficiency anemia)    PCOS (polycystic ovarian syndrome)    Prediabetes    Wears glasses    Past Surgical History:  Procedure Laterality Date   HYSTEROSCOPY WITH D & C N/A 10/17/2018   Procedure: DILATION AND CURRETAGE WITH HYSTEROSCOPY;  Surgeon: Rockney Evalene SQUIBB, MD;  Location: [facility];  Service: Gynecology;  Laterality: N/A;   HYSTEROSCOPY WITH D & C N/A 08/12/2020   Procedure: DILATATION AND CURETTAGE /HYSTEROSCOPY;  Surgeon: Jertson, Jill Evelyn, MD;  Location: [facility] [place];  Service: Gynecology;  Laterality: N/A;   LAPAROSCOPY Left 10/17/2018   Procedure: LAPAROSCOPIC  REMOVAL OF PARATUBAL CYST;  Surgeon: Rockney Evalene SQUIBB, MD;  Location: [facility];  Service: Gynecology;  Laterality: Left;   WISDOM TOOTH EXTRACTION     TEEN   Social History   Tobacco Use   Smoking status: Never   Smokeless tobacco: Never  Vaping Use   Vaping status: Never Used  Substance Use Topics   Alcohol use: Yes    Comment: Occasional   Drug use: Never   Family History  Problem Relation Age of Onset   Diabetes Mother    Hypertension Mother    Hyperlipidemia Mother    Arthritis Mother    Miscarriages / Stillbirths Mother    Obesity Mother    Allergies  Allergen Reactions   Metronidazole  Rash    ROS Negative unless stated above    Objective:     BP 98/78 (BP Location: Left Arm, Patient Position: Sitting, Cuff Size: Large)   Pulse (!) 111   Temp 98.8 F (37.1 C) (Oral)   Ht 5' 1.5 (1.562 m)  Wt 197 lb 3.2 oz (89.4 kg)   SpO2 97%   BMI 36.66 kg/m  BP Readings from Last 3 Encounters:  01/23/24 98/78  12/06/23 126/80  08/30/23 115/81   Wt Readings from Last 3 Encounters:  01/23/24 197 lb 3.2 oz (89.4 kg)  12/06/23 201 lb 3.2 oz (91.3 kg)  08/30/23 209 lb 12.8 oz (95.2 kg)      Physical Exam Constitutional:      General: She is not in acute distress.    Appearance: Normal appearance.  HENT:     Head: Normocephalic and atraumatic.     Right Ear: Tympanic membrane normal.     Left Ear: Tympanic membrane normal.     Nose: Nose normal.     Mouth/Throat:     Mouth: Mucous membranes are moist.     Pharynx: Posterior oropharyngeal erythema present.     Tonsils: Tonsillar exudate present. 1+ on the right. 1+ on the left.   Cardiovascular:     Rate and Rhythm: Normal rate and regular rhythm.     Heart sounds: Normal heart sounds. No murmur heard.    No gallop.  Pulmonary:     Effort: Pulmonary effort is normal. No respiratory distress.     Breath sounds: Normal breath sounds. No wheezing, rhonchi or rales.  Skin:    General: Skin is warm and dry.  Neurological:     Mental Status: She is alert and oriented to person, place, and time.        01/23/2024    1:40 PM 10/14/2023    1:15 PM 09/07/2023    4:13 PM  Depression screen PHQ 2/9  Decreased Interest 1 2 0  Down, Depressed, Hopeless 1 0 3  PHQ - 2 Score 2 2 3   Altered sleeping 2 0 3  Tired, decreased energy 1 3 3   Change in appetite 1 0 2  Feeling bad or failure about yourself  2 0 1  Trouble concentrating 1 0 0  Moving slowly or fidgety/restless 0 0 0  Suicidal thoughts 0 0 0  PHQ-9 Score 9 5  12    Difficult doing work/chores Somewhat difficult Not difficult at all      Data saved with a previous flowsheet row definition      01/23/2024    1:40 PM 10/14/2023    1:17 PM 09/07/2023    4:15 PM 07/08/2023    4:39 PM  GAD 7 : Generalized Anxiety Score  Nervous, Anxious, on Edge 2 0 3 2  Control/stop worrying 3 0 3 2  Worry too much - different things 3 0 3 2  Trouble relaxing 2 0 3 1  Restless 1 0 3 0  Easily annoyed or irritable 2 3 3 2   Afraid - awful might happen 3 0 3 3  Total GAD 7 Score 16 3 21 12   Anxiety Difficulty Somewhat difficult Not difficult at all  Not difficult at all     Results for orders placed or performed in visit on 01/23/24  POC COVID-19 BinaxNow  Result Value Ref Range   SARS Coronavirus 2 Ag Negative Negative  POC Influenza A/B  Result Value Ref Range   Influenza A, POC Negative Negative   Influenza B, POC Negative Negative  POCT rapid strep A  Result Value Ref Range   Rapid Strep A Screen Positive (A) Negative      Assessment & Plan:   Strep pharyngitis -     Amoxicillin ; Take 1 tablet (500 mg  total) by mouth 2 (two) times daily for 10 days.  Dispense: 20 tablet; Refill: 0  Sore throat -     POC COVID-19 BinaxNow -     POCT Influenza A/B -     POCT rapid strep A  Body aches -     nirmatrelvir /ritonavir ; Patient GFR is 123. Take nirmatrelvir  (150 mg) two tablets twice daily for 5 days and ritonavir  (100 mg) one tablet twice daily for 5 days.  Dispense: 30 tablet; Refill: 0  Chills -     nirmatrelvir /ritonavir ; Patient GFR is 123. Take nirmatrelvir  (150 mg) two tablets twice daily for 5 days and ritonavir  (100 mg) one tablet twice daily for 5 days.  Dispense: 30 tablet; Refill: 0  Type 2 diabetes mellitus with other specified complication, without long-term current use of insulin  (HCC)  At increased risk of exposure to COVID-19 virus -     nirmatrelvir /ritonavir ; Patient GFR is 123. Take nirmatrelvir  (150 mg) two tablets twice daily for 5 days and ritonavir  (100 mg) one tablet twice daily for 5 days.  Dispense: 30 tablet; Refill: 0   Acute sore throat.  POC strep test positive.  POC flu and COVID test negative.  Advised to recheck COVID test in 1 to 2 days given acute start of symptoms and question of line starting on test.  If positive can pick up Rx for Paxlovid .  If negative will not pick up prescription.  Start ABX for strep.  Continue supportive care with OTC cough/cold medications.  Patient advised to drink fluids if unable to eat.  Monitor BS closely.  Given strict precautions.  Return if symptoms worsen or fail to improve.   Clotilda JONELLE Single, MD

## 2024-01-31 ENCOUNTER — Encounter: Payer: Self-pay | Admitting: Family Medicine

## 2024-02-14 ENCOUNTER — Other Ambulatory Visit: Payer: Self-pay | Admitting: Medical Genetics

## 2024-02-14 DIAGNOSIS — Z006 Encounter for examination for normal comparison and control in clinical research program: Secondary | ICD-10-CM

## 2024-03-05 ENCOUNTER — Other Ambulatory Visit (HOSPITAL_COMMUNITY): Payer: Self-pay

## 2024-03-08 ENCOUNTER — Ambulatory Visit: Admitting: Dermatology

## 2024-03-08 ENCOUNTER — Encounter: Payer: Self-pay | Admitting: Dermatology

## 2024-03-08 VITALS — BP 108/69 | HR 86

## 2024-03-08 DIAGNOSIS — L719 Rosacea, unspecified: Secondary | ICD-10-CM | POA: Diagnosis not present

## 2024-03-08 DIAGNOSIS — L80 Vitiligo: Secondary | ICD-10-CM

## 2024-03-08 MED ORDER — HYDROCORTISONE 2.5 % EX OINT: TOPICAL_OINTMENT | Freq: Two times a day (BID) | CUTANEOUS | 6 refills | Status: AC

## 2024-03-08 MED ORDER — CLOBETASOL PROPIONATE 0.05 % EX OINT: 1.0000 | TOPICAL_OINTMENT | Freq: Two times a day (BID) | CUTANEOUS | 9 refills | Status: AC

## 2024-03-08 MED ORDER — TACROLIMUS 0.1 % EX OINT: TOPICAL_OINTMENT | Freq: Two times a day (BID) | CUTANEOUS | 9 refills | Status: AC

## 2024-03-08 NOTE — Patient Instructions (Signed)

## 2024-03-08 NOTE — Progress Notes (Signed)
 "  New Patient Visit   Subjective  Chloe Cervantes is a 34 y.o. female who presents for the following: Vitiligo Rosacea  Patient states she has Vitiligo Rosacea located at the scattered that she would like to have examined. Patient reports the areas have been there for 6 years. She reports the areas can be bothersome if she is she has too much sun exposure. Patient rates irritation 8 out of 10. She states that the areas has spread.The hypopigmentation stared on her Right Forearm.  Patient reports she has previously been treated for these areas. Shew was seen by 2 different dermatologist that recommend a topical steroid but she did not feel safe using it while trying to conceive. Patient denies Hx of bx. Patient is unsure of family history of vitiligo.   Patient reports she is actively trying to conceive.   Patient provided verbal consent for the use of an AI-assisted program to generate a detailed after-visit summary. The patient understands that the AI tool is used to support clinical documentation and that all information will be reviewed and verified by the healthcare provider.  The following portions of the chart were reviewed this encounter and updated as appropriate: medications, allergies, medical history  Review of Systems:  No other skin or systemic complaints except as noted in HPI or Assessment and Plan.  Objective  Well appearing patient in no apparent distress; mood and affect are within normal limits.  A full examination was performed including scalp, head, eyes, ears, nose, lips, neck, chest, axillae, abdomen, back, buttocks, bilateral upper extremities, bilateral lower extremities, hands, feet, fingers, toes, fingernails, and toenails. All findings within normal limits unless otherwise noted below.   Relevant exam findings are noted in the Assessment and Plan.                 Assessment & Plan   VITILIGO Exam: depigmented patches scattered throughout her  body  Patient Education Discussed During Visit: Vitiligo is a chronic autoimmune condition which causes loss of skin pigment and is commonly seen on the face and may also involve areas of trauma like hands, elbows, knees, and ankles. There is no cure and it is difficult to treat.  Treatments include topical steroids and other topical anti-inflammatory ointments/creams and topical and oral Jak inhibitors.  Sometimes narrow band UV light therapy or Xtrac laser is helpful, both of which require twice weekly treatments for at least 3-6 months.  Antioxidant vitamins, such as Vitamins A,C,E,D, Folic Acid and B12 may be added to enhance treatment. Heliocare may also enhance treatment results.  . Discussed the importance of sunscreen to prevent sunburn due to lack of pigment. Explained that Opzelura  is a non-steroidal topical cream that can be used daily once insurance coverage is obtained. Improvement may take 3-4 months, with full results in up to a year.   - Prescribed clobetasol  ointment for body use twice daily for two weeks. - Prescribed hydrocortisone  2.5% for facial use twice daily for two weeks. - After two weeks, switch to tacrolimus  ointment for face and body. - Educated on the use of mineral sunscreens, specifically Neutrogena and AVEN, for facial protection. - Provided samples of recommended sunscreens and moisturizers. - Plan to switch to Opzelura  after insurance approval and initial treatment phase.  ROSACEA Exam: Mid face erythema with telangiectasias +/- scattered inflammatory papules  Flared  Rosacea is a chronic progressive skin condition usually affecting the face of adults, causing redness and/or acne bumps. It is treatable but not curable.  It sometimes affects the eyes (ocular rosacea) as well. It may respond to topical and/or systemic medication and can flare with stress, sun exposure, alcohol, exercise, topical steroids (including hydrocortisone /cortisone 10) and some foods.   Daily application of broad spectrum spf 30+ sunscreen to face is recommended to reduce flares.  Patient denies grittiness of the eyes  Treatment Plan - Apply hydrocortisone  2.5% 2 times daily for 2 weeks - Plan to follow up in 3 months   Return in about 3 months (around 06/06/2024) for Vitiligo F/U.  I, Jetta Ager, am acting as neurosurgeon for [employer], DO.  Documentation: I have reviewed the above documentation for accuracy and completeness, and I agree with the above.  Delon Lenis, DO     "

## 2024-03-09 LAB — GENECONNECT MOLECULAR SCREEN: Genetic Analysis Overall Interpretation: NEGATIVE

## 2024-03-16 ENCOUNTER — Encounter: Payer: Self-pay | Admitting: Obstetrics and Gynecology

## 2024-03-19 ENCOUNTER — Encounter (HOSPITAL_COMMUNITY): Payer: Self-pay | Admitting: Obstetrics & Gynecology

## 2024-03-19 ENCOUNTER — Encounter: Payer: Self-pay | Admitting: Endocrinology

## 2024-03-19 ENCOUNTER — Other Ambulatory Visit: Payer: Self-pay

## 2024-03-19 ENCOUNTER — Inpatient Hospital Stay (HOSPITAL_COMMUNITY)
Admission: AD | Admit: 2024-03-19 | Discharge: 2024-03-19 | Disposition: A | Discharge status: Home / Self Care | Attending: Obstetrics & Gynecology | Admitting: Obstetrics & Gynecology

## 2024-03-19 ENCOUNTER — Inpatient Hospital Stay (HOSPITAL_COMMUNITY)

## 2024-03-19 DIAGNOSIS — R1032 Left lower quadrant pain: Secondary | ICD-10-CM | POA: Diagnosis not present

## 2024-03-19 DIAGNOSIS — O24111 Pre-existing diabetes mellitus, type 2, in pregnancy, first trimester: Secondary | ICD-10-CM | POA: Diagnosis not present

## 2024-03-19 DIAGNOSIS — O24119 Pre-existing diabetes mellitus, type 2, in pregnancy, unspecified trimester: Secondary | ICD-10-CM

## 2024-03-19 DIAGNOSIS — E119 Type 2 diabetes mellitus without complications: Secondary | ICD-10-CM

## 2024-03-19 DIAGNOSIS — O26891 Other specified pregnancy related conditions, first trimester: Secondary | ICD-10-CM | POA: Insufficient documentation

## 2024-03-19 DIAGNOSIS — O24112 Pre-existing diabetes mellitus, type 2, in pregnancy, second trimester: Secondary | ICD-10-CM | POA: Insufficient documentation

## 2024-03-19 DIAGNOSIS — Z3A11 11 weeks gestation of pregnancy: Secondary | ICD-10-CM

## 2024-03-19 LAB — ABO/RH: ABO/RH(D): O POS

## 2024-03-19 LAB — URINALYSIS, ROUTINE W REFLEX MICROSCOPIC
Bilirubin Urine: NEGATIVE
Glucose, UA: NEGATIVE mg/dL
Hgb urine dipstick: NEGATIVE
Ketones, ur: 5 mg/dL — AB
Leukocytes,Ua: NEGATIVE
Nitrite: NEGATIVE
Protein, ur: NEGATIVE mg/dL
Specific Gravity, Urine: 1.014 (ref 1.005–1.030)
pH: 5 (ref 5.0–8.0)

## 2024-03-19 LAB — WET PREP, GENITAL
Sperm: NONE SEEN
Trich, Wet Prep: NONE SEEN
WBC, Wet Prep HPF POC: >=10 — AB
Yeast Wet Prep HPF POC: NONE SEEN

## 2024-03-19 LAB — POCT PREGNANCY, URINE: Preg Test, Ur: POSITIVE — AB

## 2024-03-19 LAB — CBC
HCT: 38.8 % (ref 36.0–46.0)
Hemoglobin: 13 g/dL (ref 12.0–15.0)
MCH: 27.1 pg (ref 26.0–34.0)
MCHC: 33.5 g/dL (ref 30.0–36.0)
MCV: 80.8 fL (ref 80.0–100.0)
Platelets: 334 10*3/uL (ref 150–400)
RBC: 4.8 MIL/uL (ref 3.87–5.11)
RDW: 14 % (ref 11.5–15.5)
WBC: 9 10*3/uL (ref 4.0–10.5)
nRBC: 0 % (ref 0.0–0.2)

## 2024-03-19 LAB — HCG, QUANTITATIVE, PREGNANCY: hCG, Beta Chain, Quant, S: 38121 m[IU]/mL — ABNORMAL HIGH

## 2024-03-19 LAB — HIV ANTIBODY (ROUTINE TESTING W REFLEX): HIV Screen 4th Generation wRfx: NONREACTIVE

## 2024-03-19 NOTE — Discharge Instructions (Signed)
Safe Medications in Pregnancy    Acne: Benzoyl Peroxide Salicylic Acid  Backache/Headache: Tylenol: 2 regular strength every 4 hours OR              2 Extra strength every 6 hours  Colds/Coughs/Allergies: Benadryl (alcohol free) 25 mg every 6 hours as needed Breath right strips Claritin Cepacol throat lozenges Chloraseptic throat spray Cold-Eeze- up to three times per day Cough drops, alcohol free Flonase (by prescription only) Guaifenesin Mucinex Robitussin DM (plain only, alcohol free) Saline nasal spray/drops Sudafed (pseudoephedrine) & Actifed ** use only after [redacted] weeks gestation and if you do not have high blood pressure Tylenol Vicks Vaporub Zinc lozenges Zyrtec   Constipation: Colace Ducolax suppositories Fleet enema Glycerin suppositories Metamucil Milk of magnesia Miralax Senokot Smooth move tea  Diarrhea: Kaopectate Imodium A-D  *NO pepto Bismol  Hemorrhoids: Anusol Anusol HC Preparation H Tucks  Indigestion: Tums Maalox Mylanta Zantac  Pepcid  Insomnia: Benadryl (alcohol free) 25mg every 6 hours as needed Tylenol PM Unisom, no Gelcaps  Leg Cramps: Tums MagGel  Nausea/Vomiting:  Bonine Dramamine Emetrol Ginger extract Sea bands Meclizine  Nausea medication to take during pregnancy:  Unisom (doxylamine succinate 25 mg tablets) Take one tablet daily at bedtime. If symptoms are not adequately controlled, the dose can be increased to a maximum recommended dose of two tablets daily (1/2 tablet in the morning, 1/2 tablet mid-afternoon and one at bedtime). Vitamin B6 100mg tablets. Take one tablet twice a day (up to 200 mg per day).  Skin Rashes: Aveeno products Benadryl cream or 25mg every 6 hours as needed Calamine Lotion 1% cortisone cream  Yeast infection: Gyne-lotrimin 7 Monistat 7   **If taking multiple medications, please check labels to avoid duplicating the same active ingredients **take  medication as directed on the label ** Do not exceed 4000 mg of tylenol in 24 hours **Do not take medications that contain aspirin or ibuprofen   Prenatal Care Providers           [facility]  [address] [phone]  [facility]   [address]  [phone]  [facility] [place]       [address] [phone]            [facility] [place]     [address] [phone]          [facility] [place]   [address] [phone]  [facility]  [address] [phone]     [facility]  [address]   [phone]     [facility]  Phone: [phone]  [facility]  Phone: [phone]  [facility] Phone: [phone]  [facility] Phone: [phone]  [facility] Phone: [phone]  [facility] Phone: [phone]  [facility]  Phone: [phone]  

## 2024-03-20 ENCOUNTER — Other Ambulatory Visit (HOSPITAL_COMMUNITY): Payer: Self-pay

## 2024-03-20 ENCOUNTER — Telehealth: Payer: Self-pay | Admitting: Pharmacy Technician

## 2024-03-20 ENCOUNTER — Encounter: Payer: Self-pay | Admitting: Pharmacy Technician

## 2024-03-20 LAB — GC/CHLAMYDIA PROBE AMP (~~LOC~~) NOT AT ARMC
Chlamydia: NEGATIVE
Comment: NEGATIVE
Comment: NORMAL
Neisseria Gonorrhea: NEGATIVE

## 2024-03-20 MED ORDER — DEXCOM G7 SENSOR MISC: 1.0000 | 0 refills | Status: DC

## 2024-03-20 MED ORDER — DEXCOM G7 SENSOR MISC: 1.0000 | 3 refills | Status: AC

## 2024-03-20 NOTE — Telephone Encounter (Signed)
 If she has not stopped yet, she should stop taking Mounjaro .  Okay to continue metformin  for now.  Please check with her how are her blood sugar running ? if they are running in the normal range we can keep the as a scheduled visit in February 20.  If she is concerned about blood sugar we can reschedule, to be seen sooner.  Lavonna Lampron, MD [facility] Endocrinology [facility] [address] [place], [address] [place], [place] [address] Phone # [phone]

## 2024-03-20 NOTE — Telephone Encounter (Signed)
 Pharmacy Patient Advocate Encounter   Received notification from [facility] KEY that prior authorization for Dexcom G7 Sensor  is required/requested.   Insurance verification completed.   The patient is insured through [facility] [employer].   Per test claim: PA required; PA submitted to above mentioned insurance via Latent Key/confirmation #/EOC [id number] Status is pending

## 2024-03-20 NOTE — Telephone Encounter (Signed)
 error

## 2024-03-20 NOTE — Telephone Encounter (Signed)
 I will send new prescription for Dexcom G7.  She can use Dexcom during pregnancy.  If she is not able to get Dexcom she need to check blood sugar in the morning fasting and 2 hours after eating and send out glucose log in a week to review.  Alanny Rivers, MD [facility] Endocrinology [facility] [address] [place], [address] [place], [place] [address] Phone # [phone]

## 2024-03-22 ENCOUNTER — Other Ambulatory Visit: Payer: Self-pay

## 2024-03-22 ENCOUNTER — Ambulatory Visit: Payer: Self-pay

## 2024-03-22 VITALS — BP 133/81 | HR 89 | Wt 197.0 lb

## 2024-03-22 DIAGNOSIS — O0990 Supervision of high risk pregnancy, unspecified, unspecified trimester: Secondary | ICD-10-CM | POA: Insufficient documentation

## 2024-03-22 DIAGNOSIS — Z362 Encounter for other antenatal screening follow-up: Secondary | ICD-10-CM

## 2024-03-22 NOTE — Patient Instructions (Signed)
[facility] has a partnership with the [employer] to provide prenatal navigation for the most needed resources in our community. In order to see how we can help connect you to these resources we need consent to contact you. Please complete the very short consent using the link below:   English Link: [email or web address].[email or web address]?[email or web address]  Spanish Link: [email or web address].[email or web address]?[email or web address]  

## 2024-03-22 NOTE — Progress Notes (Unsigned)
 New OB Intake  I connected with Chloe Cervantes  on 03/22/24 at  3:10 PM EST by {Contact:24193} Video Visit and verified that I am speaking with the correct person using two identifiers. Nurse is located at [facility] and pt is located at ***.  I discussed the limitations, risks, security and privacy concerns of performing an evaluation and management service by telephone and the availability of in person appointments. I also discussed with the patient that there may be a patient responsible charge related to this service. The patient expressed understanding and agreed to proceed.  I explained I am completing New OB Intake today. We discussed EDD of *** based on {EDD:33166}. Pt is [id number]. I reviewed her allergies, medications and Medical/Surgical/OB history.    Patient Active Problem List   Diagnosis Date Noted   Supervision of high risk pregnancy, antepartum 03/22/2024   Vitiligo 04/10/2023   Rosacea 04/10/2023   Hepatic steatosis 01/15/2022   Vitamin D  deficiency 01/15/2022   Type 2 diabetes mellitus with other specified complication (HCC) 04/20/2021   Anemia 04/14/2021   Femoral acetabular impingement 02/29/2020   PCOS (polycystic ovarian syndrome) 04/07/2018   Anxiety and depression 04/07/2018   Iron deficiency anemia 04/07/2018   Obesity 09/21/2017   Abnormal uterine bleeding (AUB) 01/21/2017     Concerns addressed today  Delivery Plans Plans to deliver at [facility]. Discussed the nature of our practice with multiple providers including residents and students as well as female and female providers. Due to the size of the practice, the delivering provider may not be the same as those providing prenatal care.   Patient is not interested in water birth.  MyChart/Babyscripts MyChart access verified. I explained pt will have some visits in office and some virtually. Babyscripts instructions given and order placed. Patient verifies receipt of registration text/e-mail. Account  successfully created and app downloaded. If patient is a candidate for Optimized scheduling, add to sticky note.   Blood Pressure Cuff/Weight Scale Pt has access to BP cuff at work Explained after first prenatal appt pt will check weekly and document in Babyscripts. Patient {weight scale:28336}.  Anatomy US  Explained first scheduled US  will be around 19 weeks. Anatomy US  scheduled for TBD at TBD.  Is patient a CenteringPregnancy candidate?  {Accepted:19197::Accepted,Not a Candidate,Declined} Declined due to {Declined:19197::Schedule,Childcare,Group setting,Support person concern,Declined to say,Enrolled in [facility],***} Not a candidate due to {Not a Candidate:19197::DM,CHTN, medication controlled,Language barrier,>28 weeks,Multiple gestation (mono-mono or mono-di),Complex coordination of care needed,***} If accepted,    Is patient a Mom+Baby Combined Care candidate?  {Accepted:19197::Accepted,Declined,Not a candidate,***}   If accepted, confirm patient does not intend to move from the area for at least 12 months, then notify Mom+Baby staff  Is patient a candidate for Babyscripts Optimization? {babyscripts:31704}   First visit review I reviewed new OB appt with patient. Explained pt will be seen by *** at first visit. Discussed Chloe Cervantes genetic screening with patient. *** [employer] and [employer].. Routine prenatal labs {collected today/needed at new OB visit:9024}   Last Pap Diagnosis  Date Value Ref Range Status  04/14/2021      - Negative for Intraepithelial Lesions or Malignancy (NILM)  04/14/2021 - Benign reactive/reparative changes    04/14/2021 - See comment      Rocky CHRISTELLA Ober, RN 03/22/2024  6:43 PM

## 2024-03-23 ENCOUNTER — Encounter: Payer: Self-pay | Admitting: Obstetrics and Gynecology

## 2024-03-23 ENCOUNTER — Ambulatory Visit: Payer: Self-pay | Admitting: Obstetrics and Gynecology

## 2024-03-23 DIAGNOSIS — O0990 Supervision of high risk pregnancy, unspecified, unspecified trimester: Secondary | ICD-10-CM

## 2024-03-23 LAB — CBC/D/PLT+RPR+RH+ABO+RUBIGG...
Antibody Screen: NEGATIVE
Basophils Absolute: 0.1 10*3/uL (ref 0.0–0.2)
Basos: 1 %
EOS (ABSOLUTE): 0.1 10*3/uL (ref 0.0–0.4)
Eos: 2 %
HCV Ab: NONREACTIVE
HIV Screen 4th Generation wRfx: NONREACTIVE
Hematocrit: 37.4 % (ref 34.0–46.6)
Hemoglobin: 12.1 g/dL (ref 11.1–15.9)
Hepatitis B Surface Ag: NEGATIVE
Immature Grans (Abs): 0 10*3/uL (ref 0.0–0.1)
Immature Granulocytes: 0 %
Lymphocytes Absolute: 3.2 10*3/uL — ABNORMAL HIGH (ref 0.7–3.1)
Lymphs: 38 %
MCH: 27.3 pg (ref 26.6–33.0)
MCHC: 32.4 g/dL (ref 31.5–35.7)
MCV: 84 fL (ref 79–97)
Monocytes Absolute: 0.5 10*3/uL (ref 0.1–0.9)
Monocytes: 6 %
Neutrophils Absolute: 4.4 10*3/uL (ref 1.4–7.0)
Neutrophils: 53 %
Platelets: 316 10*3/uL (ref 150–450)
RBC: 4.43 x10E6/uL (ref 3.77–5.28)
RDW: 14.8 % (ref 11.7–15.4)
RPR Ser Ql: NONREACTIVE
Rh Factor: POSITIVE
Rubella Antibodies, IGG: 1.37 {index}
WBC: 8.2 10*3/uL (ref 3.4–10.8)

## 2024-03-23 LAB — HCV INTERPRETATION

## 2024-03-23 NOTE — Telephone Encounter (Signed)
 Reviewed glucose data from January 31 to February 6  Fasting blood sugar : 95-107 range. Blood sugar after meals 94, 123, 109, 132, 134, 136, 96, 120, 147.  Mostly blood sugar acceptable in the morning fasting and postprandially.  Limit carbohydrate for the meals.  Continue metformin  for now.  No additional medication or insulin  is required at this time.  Chloe Teagarden, MD [facility] Endocrinology [place] [facility] [address] [place], [address] [place], [place] [address] Phone # [phone]

## 2024-04-06 ENCOUNTER — Ambulatory Visit: Admitting: Endocrinology

## 2024-04-10 ENCOUNTER — Ambulatory Visit: Admitting: Endocrinology

## 2024-04-10 ENCOUNTER — Encounter: Payer: Self-pay | Admitting: Obstetrics and Gynecology

## 2024-06-11 ENCOUNTER — Ambulatory Visit: Admitting: Dermatology
